# Patient Record
Sex: Female | Born: 1935 | Race: White | Hispanic: No | Smoking: Former smoker
Health system: Southern US, Community
[De-identification: ages and names within clinical notes are randomized; demographics above are authoritative.]

## PROBLEM LIST (undated history)

## (undated) DIAGNOSIS — I10 Essential (primary) hypertension: Secondary | ICD-10-CM

## (undated) DIAGNOSIS — G8929 Other chronic pain: Secondary | ICD-10-CM

## (undated) DIAGNOSIS — M1990 Unspecified osteoarthritis, unspecified site: Secondary | ICD-10-CM

## (undated) DIAGNOSIS — F32A Depression, unspecified: Secondary | ICD-10-CM

## (undated) DIAGNOSIS — E782 Mixed hyperlipidemia: Secondary | ICD-10-CM

## (undated) DIAGNOSIS — K219 Gastro-esophageal reflux disease without esophagitis: Secondary | ICD-10-CM

## (undated) DIAGNOSIS — I4891 Unspecified atrial fibrillation: Secondary | ICD-10-CM

## (undated) DIAGNOSIS — E079 Disorder of thyroid, unspecified: Secondary | ICD-10-CM

## (undated) DIAGNOSIS — E7800 Pure hypercholesterolemia, unspecified: Secondary | ICD-10-CM

## (undated) DIAGNOSIS — E039 Hypothyroidism, unspecified: Secondary | ICD-10-CM

## (undated) HISTORY — PX: BACK SURGERY: SHX140

## (undated) HISTORY — DX: Unspecified osteoarthritis, unspecified site: M19.90

## (undated) HISTORY — DX: Unspecified atrial fibrillation: I48.91

## (undated) HISTORY — PX: SPINE SURGERY: SHX786

## (undated) HISTORY — DX: Depression, unspecified: F32.A

## (undated) HISTORY — DX: Hypothyroidism, unspecified: E03.9

## (undated) HISTORY — PX: ABDOMINAL HYSTERECTOMY: SHX81

## (undated) HISTORY — DX: Mixed hyperlipidemia: E78.2

---

## 2014-01-24 ENCOUNTER — Emergency Department
Admission: EM | Admit: 2014-01-24 | Discharge: 2014-01-24 | Disposition: A | Discharge status: Home / Self Care | Payer: Medicare HMO | Source: Home / Self Care

## 2014-01-25 ENCOUNTER — Ambulatory Visit (INDEPENDENT_AMBULATORY_CARE_PROVIDER_SITE_OTHER): Payer: Medicare HMO

## 2014-01-25 ENCOUNTER — Ambulatory Visit (INDEPENDENT_AMBULATORY_CARE_PROVIDER_SITE_OTHER): Payer: Medicare HMO | Admitting: Sports Medicine

## 2014-01-25 ENCOUNTER — Encounter: Payer: Self-pay | Admitting: Sports Medicine

## 2014-01-25 VITALS — BP 191/83 | HR 70 | Ht 64.0 in | Wt 174.0 lb

## 2014-01-25 DIAGNOSIS — M169 Osteoarthritis of hip, unspecified: Secondary | ICD-10-CM

## 2014-01-25 DIAGNOSIS — M1710 Unilateral primary osteoarthritis, unspecified knee: Secondary | ICD-10-CM

## 2014-01-25 DIAGNOSIS — M25569 Pain in unspecified knee: Secondary | ICD-10-CM

## 2014-01-25 DIAGNOSIS — M1612 Unilateral primary osteoarthritis, left hip: Secondary | ICD-10-CM

## 2014-01-25 DIAGNOSIS — M25559 Pain in unspecified hip: Secondary | ICD-10-CM

## 2014-01-25 DIAGNOSIS — IMO0002 Reserved for concepts with insufficient information to code with codable children: Secondary | ICD-10-CM

## 2014-01-25 DIAGNOSIS — M1711 Unilateral primary osteoarthritis, right knee: Secondary | ICD-10-CM

## 2014-01-25 DIAGNOSIS — M1610 Unilateral primary osteoarthritis, unspecified hip: Secondary | ICD-10-CM

## 2014-01-25 DIAGNOSIS — M25469 Effusion, unspecified knee: Secondary | ICD-10-CM

## 2014-01-25 MED ORDER — CELECOXIB 200 MG PO CAPS: ORAL_CAPSULE | ORAL | Status: DC

## 2014-01-25 NOTE — Assessment & Plan Note (Signed)
Right knee injection as above. X-rays, Celebrex, physical therapy. Return in one month.

## 2014-01-25 NOTE — Progress Notes (Signed)
   Subjective:    I'm seeing this patient as a consultation for:  Dr. Alvester MorinNewton  CC: Left hip and right knee pain  HPI: Left hip: Pain is localized in the groin, moderate, persistent, present for years, worse with weightbearing and hip flexion.  Right knee pain: Localized joint line, moderate, persistent, worse with weightbearing, worse in the mornings.  Past medical history, Surgical history, Family history not pertinant except as noted below, Social history, Allergies, and medications have been entered into the medical record, reviewed, and no changes needed.   Review of Systems: No headache, visual changes, nausea, vomiting, diarrhea, constipation, dizziness, abdominal pain, skin rash, fevers, chills, night sweats, weight loss, swollen lymph nodes, body aches, joint swelling, muscle aches, chest pain, shortness of breath, mood changes, visual or auditory hallucinations.   Objective:   General: Well Developed, well nourished, and in no acute distress.  Neuro/Psych: Alert and oriented x3, extra-ocular muscles intact, able to move all 4 extremities, sensation grossly intact. Skin: Warm and dry, no rashes noted.  Respiratory: Not using accessory muscles, speaking in full sentences, trachea midline.  Cardiovascular: Pulses palpable, no extremity edema. Abdomen: Does not appear distended. Left Hip: Reproduction of pain in groin with hip internal rotation. Pelvic alignment unremarkable to inspection and palpation. Standing hip rotation and gait without trendelenburg sign / unsteadiness. Greater trochanter without tenderness to palpation. No tenderness over piriformis. No pain with FABER or FADIR. No SI joint tenderness and normal minimal SI movement. Right Knee: Tender to palpation with moderate swelling at the medial joint line. ROM full in flexion and extension and lower leg rotation. Ligaments with solid consistent endpoints including ACL, PCL, LCL, MCL. Negative Mcmurray's, Apley's,  and Thessalonian tests. Non painful patellar compression. Patellar glide without crepitus. Patellar and quadriceps tendons unremarkable. Hamstring and quadriceps strength is normal.   Procedure: Real-time Ultrasound Guided Injection of left femoral acetabular joint Device: GE Logiq E  Verbal informed consent obtained.  Time-out conducted.  Noted no overlying erythema, induration, or other signs of local infection.  Skin prepped in a sterile fashion.  Local anesthesia: Topical Ethyl chloride.  With sterile technique and under real time ultrasound guidance:  Spinal needle advanced into the joint, 2cc Kenalog 40, 4 cc lidocaine injected easily. Completed without difficulty  Pain immediately resolved suggesting accurate placement of the medication.  Advised to call if fevers/chills, erythema, induration, drainage, or persistent bleeding.  Images permanently stored and available for review in the ultrasound unit.  Impression: Technically successful ultrasound guided injection.  Procedure: Real-time Ultrasound Guided Injection of right knee Device: GE Logiq E  Verbal informed consent obtained.  Time-out conducted.  Noted no overlying erythema, induration, or other signs of local infection.  Skin prepped in a sterile fashion.  Local anesthesia: Topical Ethyl chloride.  With sterile technique and under real time ultrasound guidance:  2 cc Kenalog 40, 4 cc lidocaine injected easily into the suprapatellar recess. Completed without difficulty  Pain immediately resolved suggesting accurate placement of the medication.  Advised to call if fevers/chills, erythema, induration, drainage, or persistent bleeding.  Images permanently stored and available for review in the ultrasound unit.  Impression: Technically successful ultrasound guided injection.  Impression and Recommendations:   This case required medical decision making of moderate complexity.

## 2014-01-25 NOTE — Assessment & Plan Note (Signed)
Left femoral acetabular joint injection as above. Celebrex. X-rays. Formal physical therapy.

## 2014-01-30 ENCOUNTER — Telehealth: Payer: Self-pay

## 2014-01-30 MED ORDER — MELOXICAM 15 MG PO TABS: ORAL_TABLET | ORAL | Status: DC

## 2014-01-30 NOTE — Telephone Encounter (Signed)
Patient called stated that Celebrex was too high and she wanted to know if she could get something called in that was least expensive. Kanai Berrios,CMA

## 2014-01-30 NOTE — Telephone Encounter (Signed)
Switching to meloxicam. 

## 2014-01-30 NOTE — Telephone Encounter (Signed)
Spoke to patient and she stated that she has tried Meloxicam, and Naproxen and Tramadol  does nothing for her she stated that she cannot take because it make her sick at the stomach. She stated that she will contact her insurance to see what she can get but she really need to get it to a 2 tier in order for it to be cheaper. Sherley Mckenney,CMA

## 2014-01-31 MED ORDER — DICLOFENAC SODIUM 75 MG PO TBEC: 75.0000 mg | DELAYED_RELEASE_TABLET | Freq: Two times a day (BID) | ORAL | Status: DC

## 2014-01-31 NOTE — Telephone Encounter (Signed)
Lets try diclofenac.

## 2014-02-01 NOTE — Telephone Encounter (Signed)
Left a message on patient vm letting her know that Diclofenac was sent in to her pharmacy and if she had any questions or concern to call me back at the office. Masaye Gatchalian,CMA

## 2014-02-05 ENCOUNTER — Ambulatory Visit: Payer: Medicare HMO | Admitting: Physical Therapy

## 2014-02-12 ENCOUNTER — Ambulatory Visit (INDEPENDENT_AMBULATORY_CARE_PROVIDER_SITE_OTHER): Payer: Medicare HMO | Admitting: Physical Therapy

## 2014-02-12 DIAGNOSIS — M25559 Pain in unspecified hip: Secondary | ICD-10-CM

## 2014-02-12 DIAGNOSIS — M169 Osteoarthritis of hip, unspecified: Secondary | ICD-10-CM

## 2014-02-12 DIAGNOSIS — M6281 Muscle weakness (generalized): Secondary | ICD-10-CM

## 2014-02-12 DIAGNOSIS — M1610 Unilateral primary osteoarthritis, unspecified hip: Secondary | ICD-10-CM

## 2014-02-12 DIAGNOSIS — R269 Unspecified abnormalities of gait and mobility: Secondary | ICD-10-CM

## 2014-02-14 ENCOUNTER — Encounter (INDEPENDENT_AMBULATORY_CARE_PROVIDER_SITE_OTHER): Payer: Medicare HMO | Admitting: Physical Therapy

## 2014-02-14 DIAGNOSIS — M6281 Muscle weakness (generalized): Secondary | ICD-10-CM

## 2014-02-14 DIAGNOSIS — M25559 Pain in unspecified hip: Secondary | ICD-10-CM

## 2014-02-14 DIAGNOSIS — M1610 Unilateral primary osteoarthritis, unspecified hip: Secondary | ICD-10-CM

## 2014-02-14 DIAGNOSIS — R269 Unspecified abnormalities of gait and mobility: Secondary | ICD-10-CM

## 2014-02-14 DIAGNOSIS — M169 Osteoarthritis of hip, unspecified: Secondary | ICD-10-CM

## 2014-02-18 ENCOUNTER — Encounter (INDEPENDENT_AMBULATORY_CARE_PROVIDER_SITE_OTHER): Payer: Medicare HMO | Admitting: Physical Therapy

## 2014-02-18 DIAGNOSIS — M6281 Muscle weakness (generalized): Secondary | ICD-10-CM

## 2014-02-18 DIAGNOSIS — R269 Unspecified abnormalities of gait and mobility: Secondary | ICD-10-CM

## 2014-02-18 DIAGNOSIS — M1610 Unilateral primary osteoarthritis, unspecified hip: Secondary | ICD-10-CM

## 2014-02-18 DIAGNOSIS — M169 Osteoarthritis of hip, unspecified: Secondary | ICD-10-CM

## 2014-02-18 DIAGNOSIS — M25559 Pain in unspecified hip: Secondary | ICD-10-CM

## 2014-02-21 ENCOUNTER — Encounter (INDEPENDENT_AMBULATORY_CARE_PROVIDER_SITE_OTHER): Payer: Medicare HMO | Admitting: Physical Therapy

## 2014-02-21 DIAGNOSIS — R269 Unspecified abnormalities of gait and mobility: Secondary | ICD-10-CM

## 2014-02-21 DIAGNOSIS — M6281 Muscle weakness (generalized): Secondary | ICD-10-CM

## 2014-02-21 DIAGNOSIS — M169 Osteoarthritis of hip, unspecified: Secondary | ICD-10-CM

## 2014-02-21 DIAGNOSIS — M1610 Unilateral primary osteoarthritis, unspecified hip: Secondary | ICD-10-CM

## 2014-02-21 DIAGNOSIS — M25559 Pain in unspecified hip: Secondary | ICD-10-CM

## 2014-02-22 ENCOUNTER — Ambulatory Visit (INDEPENDENT_AMBULATORY_CARE_PROVIDER_SITE_OTHER): Payer: Medicare HMO | Admitting: Sports Medicine

## 2014-02-22 ENCOUNTER — Encounter: Payer: Self-pay | Admitting: Sports Medicine

## 2014-02-22 VITALS — BP 112/65 | HR 74 | Ht 63.0 in | Wt 171.0 lb

## 2014-02-22 DIAGNOSIS — M1610 Unilateral primary osteoarthritis, unspecified hip: Secondary | ICD-10-CM

## 2014-02-22 DIAGNOSIS — IMO0002 Reserved for concepts with insufficient information to code with codable children: Secondary | ICD-10-CM

## 2014-02-22 DIAGNOSIS — M169 Osteoarthritis of hip, unspecified: Secondary | ICD-10-CM

## 2014-02-22 DIAGNOSIS — M7062 Trochanteric bursitis, left hip: Secondary | ICD-10-CM | POA: Insufficient documentation

## 2014-02-22 DIAGNOSIS — M1612 Unilateral primary osteoarthritis, left hip: Secondary | ICD-10-CM

## 2014-02-22 DIAGNOSIS — M1711 Unilateral primary osteoarthritis, right knee: Secondary | ICD-10-CM

## 2014-02-22 DIAGNOSIS — M1710 Unilateral primary osteoarthritis, unspecified knee: Secondary | ICD-10-CM

## 2014-02-22 DIAGNOSIS — M76899 Other specified enthesopathies of unspecified lower limb, excluding foot: Secondary | ICD-10-CM

## 2014-02-22 NOTE — Assessment & Plan Note (Signed)
Resolved after injection. Return as needed.

## 2014-02-22 NOTE — Assessment & Plan Note (Signed)
This is new. She declines injection today, continue to work aggressively with physical therapy. Return to see me in 3 weeks, consider trochanteric bursa injection if no better.

## 2014-02-22 NOTE — Assessment & Plan Note (Signed)
Osteoarthritis pain in the groin has completely resolved after injection. She does have some trochanteric bursa symptoms.

## 2014-02-22 NOTE — Progress Notes (Signed)
  Subjective:    CC: Followup  HPI: Left hip osteoarthritis: Groin pain is now completely resolved after physical therapy and an intra-articular injection.  Left trochanteric bursitis: Now has a different pain that she localizes over the greater trochanter, improved slightly with physical therapy, not amenable to injection today.  Right knee osteoarthritis: Completely resolved after injection and physical therapy.  Past medical history, Surgical history, Family history not pertinant except as noted below, Social history, Allergies, and medications have been entered into the medical record, reviewed, and no changes needed.   Review of Systems: No fevers, chills, night sweats, weight loss, chest pain, or shortness of breath.   Objective:    General: Well Developed, well nourished, and in no acute distress.  Neuro: Alert and oriented x3, extra-ocular muscles intact, sensation grossly intact.  HEENT: Normocephalic, atraumatic, pupils equal round reactive to light, neck supple, no masses, no lymphadenopathy, thyroid nonpalpable.  Skin: Warm and dry, no rashes. Cardiac: Regular rate and rhythm, no murmurs rubs or gallops, no lower extremity edema.  Respiratory: Clear to auscultation bilaterally. Not using accessory muscles, speaking in full sentences. Left Hip: ROM IR: 45 Deg, ER: 45 Deg, Flexion: 120 Deg, Extension: 100 Deg, Abduction: 45 Deg, Adduction: 45 Deg Strength IR: 5/5, ER: 5/5, Flexion: 5/5, Extension: 5/5, Abduction: 5/5, Adduction: 5/5 Pelvic alignment unremarkable to inspection and palpation. Standing hip rotation and gait without trendelenburg sign / unsteadiness. Greater trochanter with tenderness to palpation. No tenderness over piriformis. No pain with FABER or FADIR. No SI joint tenderness and normal minimal SI movement.  Impression and Recommendations:

## 2014-02-26 ENCOUNTER — Encounter: Payer: Medicare HMO | Admitting: Physical Therapy

## 2014-02-28 ENCOUNTER — Encounter (INDEPENDENT_AMBULATORY_CARE_PROVIDER_SITE_OTHER): Payer: Medicare HMO | Admitting: Physical Therapy

## 2014-02-28 DIAGNOSIS — M6281 Muscle weakness (generalized): Secondary | ICD-10-CM

## 2014-02-28 DIAGNOSIS — R269 Unspecified abnormalities of gait and mobility: Secondary | ICD-10-CM

## 2014-02-28 DIAGNOSIS — M25559 Pain in unspecified hip: Secondary | ICD-10-CM

## 2014-02-28 DIAGNOSIS — M1610 Unilateral primary osteoarthritis, unspecified hip: Secondary | ICD-10-CM

## 2014-02-28 DIAGNOSIS — M169 Osteoarthritis of hip, unspecified: Secondary | ICD-10-CM

## 2014-03-06 ENCOUNTER — Encounter (INDEPENDENT_AMBULATORY_CARE_PROVIDER_SITE_OTHER): Payer: Medicare HMO | Admitting: Physical Therapy

## 2014-03-06 DIAGNOSIS — M169 Osteoarthritis of hip, unspecified: Secondary | ICD-10-CM

## 2014-03-06 DIAGNOSIS — M25559 Pain in unspecified hip: Secondary | ICD-10-CM

## 2014-03-06 DIAGNOSIS — M1610 Unilateral primary osteoarthritis, unspecified hip: Secondary | ICD-10-CM

## 2014-03-06 DIAGNOSIS — M6281 Muscle weakness (generalized): Secondary | ICD-10-CM

## 2014-03-06 DIAGNOSIS — R269 Unspecified abnormalities of gait and mobility: Secondary | ICD-10-CM

## 2014-03-07 ENCOUNTER — Encounter: Payer: Medicare HMO | Admitting: Physical Therapy

## 2014-03-08 ENCOUNTER — Encounter: Payer: Medicare HMO | Admitting: Physical Therapy

## 2014-03-11 ENCOUNTER — Encounter: Payer: Medicare HMO | Admitting: Physical Therapy

## 2014-03-18 ENCOUNTER — Ambulatory Visit: Payer: Medicare HMO | Admitting: Sports Medicine

## 2014-03-18 ENCOUNTER — Encounter: Payer: Medicare HMO | Admitting: Family Medicine

## 2014-03-19 NOTE — Progress Notes (Signed)
   Subjective:    Patient ID: Robin Malone, female    DOB: Jul 18, 1934, 80 y.o.   MRN: [id number]  HPI    Review of Systems     Objective:   Physical Exam        Assessment & Plan:  Chart was entered in error. A point was canceled.

## 2014-04-11 ENCOUNTER — Telehealth: Payer: Self-pay

## 2014-04-11 NOTE — Telephone Encounter (Signed)
Patient request a Rx for Gabapentin 600mg  sent to [facility]. I did not see where we prescribed that medication and according to patient last visit she was told to follow up in 3 weeks which she did not do so patient has been notified to schedule a follow up appt. Khayree Delellis,CMA

## 2014-04-11 NOTE — Telephone Encounter (Signed)
Agree, I don't see where we have ever prescribed this.

## 2015-02-17 ENCOUNTER — Ambulatory Visit (INDEPENDENT_AMBULATORY_CARE_PROVIDER_SITE_OTHER): Payer: Medicare HMO | Admitting: Sports Medicine

## 2015-02-17 ENCOUNTER — Ambulatory Visit (INDEPENDENT_AMBULATORY_CARE_PROVIDER_SITE_OTHER): Payer: Medicare HMO

## 2015-02-17 ENCOUNTER — Encounter: Payer: Self-pay | Admitting: Sports Medicine

## 2015-02-17 VITALS — BP 136/64 | HR 80 | Ht 64.0 in | Wt 179.0 lb

## 2015-02-17 DIAGNOSIS — M4316 Spondylolisthesis, lumbar region: Secondary | ICD-10-CM | POA: Insufficient documentation

## 2015-02-17 DIAGNOSIS — M48061 Spinal stenosis, lumbar region without neurogenic claudication: Secondary | ICD-10-CM | POA: Insufficient documentation

## 2015-02-17 DIAGNOSIS — M47896 Other spondylosis, lumbar region: Secondary | ICD-10-CM

## 2015-02-17 DIAGNOSIS — M5416 Radiculopathy, lumbar region: Secondary | ICD-10-CM

## 2015-02-17 MED ORDER — PREDNISONE 50 MG PO TABS: ORAL_TABLET | ORAL | Status: DC

## 2015-02-17 NOTE — Progress Notes (Signed)
  Subjective:    CC: back pain  HPI: Patient presents with complaint of intermittent aching lower back pain for 8 months. The pain radiates to her left anterior thigh and shin. The pain is worsened with activity and prolonged sitting. The patient plays golf and twisting makes her pain worse. Recently the pain has been waking the patient up at night. She has a history of lumbar fusion but does not remember which levels were fused. She has had formal PT for her hip pain in the past and no longer complains of pain in her hip or over the trochanteric bursa. She has not undergone formal PT for this new back pain. She denies any numbness, tingling, lower extremity weakness, saddle anesthesia, urine or stool incontinence.   Past medical history, Surgical history, Family history not pertinant except as noted below, Social history, Allergies, and medications have been entered into the medical record, reviewed, and no changes needed.   Review of Systems: No fevers, chills, night sweats, weight loss, chest pain, or shortness of breath.   Objective:    General: Well Developed, well nourished, and in no acute distress.  Neuro: Alert and oriented x3, extra-ocular muscles intact, sensation grossly intact.  HEENT: Normocephalic, atraumatic, pupils equal round reactive to light, neck supple, no masses, no lymphadenopathy, thyroid nonpalpable.  Skin: Warm and dry, no rashes. Cardiac: Regular rate and rhythm, no murmurs rubs or gallops, no lower extremity edema.  Respiratory: Clear to auscultation bilaterally. Not using accessory muscles, speaking in full sentences. MSK: Back Exam:  Inspection: surgical scar present over lower back. SLR laying: Negative  XSLR laying: Negative   Palpable tenderness: over SI joints bilaterally, worse over left than right. FABER: negative. Sensory change: Gross sensation intact to all lumbar and sacral dermatomes.  Reflexes: 2+ at both patellar tendons, 1+ at achilles tendons,  Babinski's downgoing.  Strength at foot  Plantar-flexion: 5/5 Dorsi-flexion: 5/5 Eversion: 5/5 Inversion: 5/5  Leg strength  Quad: 5/5 Hamstring: 4/5 Hip flexor: 5/5 Hip abductors: 5/5  Gait unremarkable.   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

## 2015-02-17 NOTE — Assessment & Plan Note (Signed)
Post-lumbar fusion, patient is unaware of which level was fused. She likely has a reherniation versus adjacent level disease with left-sided L5 distribution radiculitis. We are going to proceed quickly through physical therapy, prednisone, I would like an x-ray and an MRI with and without IV contrast considering prior fusion. Return to see me to go over MRI results.

## 2015-02-18 ENCOUNTER — Telehealth: Payer: Self-pay | Admitting: Sports Medicine

## 2015-02-18 LAB — BASIC METABOLIC PANEL WITH GFR
CO2: 28 meq/L (ref 19–32)
Creat: 0.97 mg/dL (ref 0.50–1.10)
Glucose, Bld: 85 mg/dL (ref 70–99)

## 2015-02-18 LAB — BASIC METABOLIC PANEL
BUN: 20 mg/dL (ref 6–23)
Calcium: 9.4 mg/dL (ref 8.4–10.5)
Chloride: 104 mEq/L (ref 96–112)
Potassium: 3.9 mEq/L (ref 3.5–5.3)
Sodium: 141 mEq/L (ref 135–145)

## 2015-02-18 NOTE — Telephone Encounter (Signed)
Called [employer] for prior auth on MRI lumbar spine. Auth # [id number] Exp 03/21/2015 - CF

## 2015-02-22 ENCOUNTER — Ambulatory Visit (HOSPITAL_BASED_OUTPATIENT_CLINIC_OR_DEPARTMENT_OTHER)
Admission: RE | Admit: 2015-02-22 | Discharge: 2015-02-22 | Disposition: A | Discharge status: Home / Self Care | Payer: Medicare HMO | Source: Ambulatory Visit | Attending: Sports Medicine | Admitting: Sports Medicine

## 2015-02-22 DIAGNOSIS — M5416 Radiculopathy, lumbar region: Secondary | ICD-10-CM | POA: Insufficient documentation

## 2015-02-22 MED ORDER — GADOBENATE DIMEGLUMINE 529 MG/ML IV SOLN: 16.0000 mL | Freq: Once | INTRAVENOUS | Status: AC | PRN

## 2015-02-27 ENCOUNTER — Encounter: Payer: Self-pay | Admitting: Sports Medicine

## 2015-02-27 ENCOUNTER — Ambulatory Visit (INDEPENDENT_AMBULATORY_CARE_PROVIDER_SITE_OTHER): Payer: Medicare HMO | Admitting: Sports Medicine

## 2015-02-27 VITALS — BP 126/65 | HR 72 | Wt 176.0 lb

## 2015-02-27 DIAGNOSIS — M4316 Spondylolisthesis, lumbar region: Secondary | ICD-10-CM | POA: Diagnosis not present

## 2015-02-27 DIAGNOSIS — M1711 Unilateral primary osteoarthritis, right knee: Secondary | ICD-10-CM

## 2015-02-27 NOTE — Progress Notes (Signed)
  Subjective:    CC: MRI results  HPI: Robin Malone returns, she has low back pain, better with flexion, with radiation cramping into both legs when walking. She has a history of an L4-L5 posterior element fusion for what sounds to be spondylolisthesis in the distant past. She has not done any physical therapy as asked, and apologizes. Obvious lesions continues to have pain.  Right knee pain: History of osteoarthritis, previous injection was approximately 13-14 months ago. Pain is moderate, persistent, localized at the medial joint line without mechanical symptoms.  Past medical history, Surgical history, Family history not pertinant except as noted below, Social history, Allergies, and medications have been entered into the medical record, reviewed, and no changes needed.   Review of Systems: No fevers, chills, night sweats, weight loss, chest pain, or shortness of breath.   Objective:    General: Well Developed, well nourished, and in no acute distress.  Neuro: Alert and oriented x3, extra-ocular muscles intact, sensation grossly intact.  HEENT: Normocephalic, atraumatic, pupils equal round reactive to light, neck supple, no masses, no lymphadenopathy, thyroid nonpalpable.  Skin: Warm and dry, no rashes. Cardiac: Regular rate and rhythm, no murmurs rubs or gallops, no lower extremity edema.  Respiratory: Clear to auscultation bilaterally. Not using accessory muscles, speaking in full sentences.  Procedure: Real-time Ultrasound Guided Injection of right knee Device: GE Logiq E  Verbal informed consent obtained.  Time-out conducted.  Noted no overlying erythema, induration, or other signs of local infection.  Skin prepped in a sterile fashion.  Local anesthesia: Topical Ethyl chloride.  With sterile technique and under real time ultrasound guidance:  2 mL kenalog 40, 4 mL lidocaine injected easily. Completed without difficulty  Pain immediately resolved suggesting accurate placement of the  medication.  Advised to call if fevers/chills, erythema, induration, drainage, or persistent bleeding.  Images permanently stored and available for review in the ultrasound unit.   Impression: Technically successful ultrasound guided injection. MRI personally reviewed and shows multilevel lumbar spondylosis with moderate L4 on L5 spondylolisthesis with evidence of an attempted posterior fusion, and moderate to severe central canal stenosis  Impression and Recommendations:

## 2015-02-27 NOTE — Assessment & Plan Note (Signed)
Moderate L4 on L5 spondylolisthesis with moderate severe central canal stenosis, and with what appears to be evidence of a failed L4-L5 posterior element fusion. We still need to do physical therapy. Considering her pain I'm also going to set her up with a bilateral L4-L5 transforaminal epidural, I would also like her to see Dr. Yevette Edwardsumonski with spine surgery for consultation. Return to see me 4 weeks after the injection to evaluate response.

## 2015-02-27 NOTE — Assessment & Plan Note (Signed)
Good response to previous injection. Repeat aspiration and injection today.

## 2015-03-07 ENCOUNTER — Ambulatory Visit
Admission: RE | Admit: 2015-03-07 | Discharge: 2015-03-07 | Disposition: A | Discharge status: Home / Self Care | Payer: Medicare HMO | Source: Ambulatory Visit | Attending: Sports Medicine | Admitting: Sports Medicine

## 2015-03-07 ENCOUNTER — Other Ambulatory Visit: Payer: Self-pay | Admitting: Sports Medicine

## 2015-03-07 DIAGNOSIS — M4316 Spondylolisthesis, lumbar region: Secondary | ICD-10-CM

## 2015-03-07 MED ORDER — IOHEXOL 180 MG/ML  SOLN
1.0000 mL | Freq: Once | INTRAMUSCULAR | Status: AC | PRN
  Administered 2015-03-07: 1 mL via EPIDURAL

## 2015-03-07 MED ORDER — METHYLPREDNISOLONE ACETATE 40 MG/ML INJ SUSP (RADIOLOG
120.0000 mg | Freq: Once | INTRAMUSCULAR | Status: AC
  Administered 2015-03-07: 120 mg via EPIDURAL

## 2015-03-07 NOTE — Discharge Instructions (Signed)

## 2015-03-12 ENCOUNTER — Ambulatory Visit (INDEPENDENT_AMBULATORY_CARE_PROVIDER_SITE_OTHER): Payer: Medicare HMO | Admitting: Physical Therapy

## 2015-03-12 ENCOUNTER — Encounter: Payer: Self-pay | Admitting: Physical Therapy

## 2015-03-12 DIAGNOSIS — M545 Low back pain, unspecified: Secondary | ICD-10-CM

## 2015-03-12 DIAGNOSIS — M6281 Muscle weakness (generalized): Secondary | ICD-10-CM

## 2015-03-12 NOTE — Therapy (Signed)
[facility] [address] [place], [place], [address] Phone: [phone]   Fax:  [phone]  Physical Therapy Evaluation  Patient Details  Name: Robin Malone MRN: [id number] Date of Birth: Dec 31, 1933 Referring Provider:  Monica Becton,*  Encounter Date: 03/12/2015      PT End of Session - 03/12/15 1433    Visit Number 1   Number of Visits 8   Date for PT Re-Evaluation 04/09/15   PT Start Time 1433   PT Stop Time 1522   PT Time Calculation (min) 49 min   Activity Tolerance Patient limited by pain      History reviewed. No pertinent past medical history.  History reviewed. No pertinent past surgical history.  There were no vitals filed for this visit.  Visit Diagnosis:  Pain of lumbar spine - Plan: PT plan of care cert/re-cert  Muscle weakness-general - Plan: PT plan of care cert/re-cert      Subjective Assessment - 03/12/15 1434    Symptoms Had pain in her back and groin Lt side.  Had an injection 5 days ago and it seemed to help some, she no longer has the pain in her legs at night now   Pertinent History She has not been going to the gym since last year when she began having Lt hip pain.    How long can you stand comfortably? tolerates about 20'    Diagnostic tests MRI showed a lot of degeneration and narrowing of the spine   Patient Stated Goals pt wishes to strengthen her legs and her back and bring her Lt foot up to donn her panty hose.    Currently in Pain? Yes   Pain Score 3    Pain Location Back   Pain Orientation Other (Comment)  below the ribs   Pain Descriptors / Indicators Dull            [facility] PT Assessment - 03/12/15 0001    Assessment   Medical Diagnosis Lt lumbar radiculitis   Onset Date 03/11/14   Next MD Visit 03/27/15   Precautions   Precautions None   Balance Screen   Has the patient fallen in the past 6 months No   Has the patient had a decrease in activity level  because of a fear of falling?  No   Is the patient reluctant to leave their home because of a fear of falling?  No   Home Environment   Living Enviornment Private residence   Home Access --  has stairs to get to the laundry   Additional Comments has trouble on the stairs when she is carrying something   Prior Function   Level of Independence --  I with all activies   Leisure wishes to return to th gym and walk her dog   Observation/Other Assessments   Focus on Therapeutic Outcomes (FOTO)  57% limited   Posture/Postural Control   Posture/Postural Control Postural limitations   Postural Limitations Flexed trunk;Left pelvic obliquity;Decreased lumbar lordosis   ROM / Strength   AROM / PROM / Strength AROM;Strength   AROM   AROM Assessment Site Lumbar;Hip   Right/Left Hip Right;Left   Left Hip External Rotation  --  tightness in hip rotators and ITB Lt   Lumbar Flexion WNL  tight in back   Lumbar Extension WNL   Lumbar - Right Rotation WNL   Lumbar - Left Rotation WNL   Strength   Overall Strength --  bilat ankles  and knees WNL, except Lt knee extension 5-/5   Overall Strength Comments --  transverse abdominis poor, multifidis Lt fair, Rt poor   Strength Assessment Site Hip   Right/Left Hip Right;Left   Right Hip Flexion 5/5   Right Hip Extension 4+/5   Right Hip ABduction 4-/5   Left Hip Flexion 4+/5   Left Hip Extension 4/5   Left Hip ABduction 4-/5   Palpation   Palpation tightness in Lt lumbar paraspinals with tenderness and into Lt piriformis.  hypersensitive with CPA mobs scacrum to upper thoracic spine.                    [facility] Adult PT Treatment/Exercise - 03/12/15 0001    Exercises   Exercises Lumbar   Lumbar Exercises: Stretches   Lower Trunk Rotation --  10 reps   Pelvic Tilt --  5sec x 10 reps   Piriformis Stretch 2 reps;20 seconds   Piriformis Stretch Limitations unable to lift other leg   Lumbar Exercises: Supine   Other Supine Lumbar  Exercises thoracic lift x 10   Lumbar Exercises: Sidelying   Hip Abduction 20 reps  bilat   Modalities   Modalities Electrical Stimulation;Moist Heat   Moist Heat Therapy   Number Minutes Moist Heat 15 Minutes   Moist Heat Location --  lumbar   Electrical Stimulation   Electrical Stimulation Location lumbar and Lt piriformis   Electrical Stimulation Action IFC   Electrical Stimulation Parameters to tolerance   Electrical Stimulation Goals Pain;Tone                PT Education - 03/12/15 1507    Education provided Yes   Education Details HEP   Person(s) Educated Patient   Methods Explanation;Handout   Comprehension Verbalized understanding             PT Long Term Goals - 03/12/15 1511    PT LONG TERM GOAL #1   Title I with advanced HEP   Time 4   Period Weeks   Status New   PT LONG TERM GOAL #2   Title improve FOTO =/> CJ level   Time 4   Period Weeks   Status New   PT LONG TERM GOAL #3   Title sit with Lt leg crossed/ foot on Rt knee to donn panty hose   Time 4   Period Weeks   Status New   PT LONG TERM GOAL #4   Title increase strength bilat hip abduction and extension =/> 5-/5   Time 4   Period Weeks   Status New   PT LONG TERM GOAL #5   Title play golf without significant pain the next day in her back   Time 4   Period Weeks   Status New               Plan - 03/12/15 1512    Clinical Impression Statement Patient presents with long h/p LBP and Lt hip/LE pain,  Seems to have responded to injections last week with relief from pain into her LE.  She has weakness of her core and hips along with pain in her low to mid back now.    Pt will benefit from skilled therapeutic intervention in order to improve on the following deficits Decreased strength;Pain;Postural dysfunction;Improper body mechanics   Rehab Potential Good   PT Frequency 2x / week   PT Duration 4 weeks   PT Treatment/Interventions Electrical Stimulation;Cryotherapy;Manual  techniques;Ultrasound;Therapeutic exercise;Patient/family education;Therapeutic  activities;Moist Heat   PT Next Visit Plan progress core exercise and modalities to decrease pain   Consulted and Agree with Plan of Care Patient         Problem List Patient Active Problem List   Diagnosis Date Noted  . Spondylolisthesis at L4-L5 level 02/17/2015  . Trochanteric bursitis of left hip 02/22/2014  . Osteoarthritis of left hip 01/25/2014  . Osteoarthritis of right knee 01/25/2014    Roderic Scarce, PT 03/12/2015, 3:17 PM  [facility] [address] [place], [place], [address] Phone: [phone]   Fax:  [phone]

## 2015-03-12 NOTE — Patient Instructions (Signed)
Lower Trunk Rotation Stretch   Keeping back flat and feet together, rotate knees to left side. Hold _1___ seconds. Repeat _10___ times per set. Do __1__ sets per session. Do __1__ sessions per day.  [email or web address]://[email or web address].[email or web address] Piriformis (Supine)   Keep Right foot on the floor/bed, cross legs, left on top. Gently push the left knee away from the body until stretch is felt in buttock/hip of top leg. Hold __20__ seconds. Repeat ____ times per set. Do __1__ sets per session. Do __1__ sessions per day.  [email or web address]://[email or web address].us/676   Pelvic Tilt   Flatten back by tightening stomach muscles and buttocks. Repeat _10__ times per set. Do __2__ sets per session. Do ____ sessions per day.  [email or web address]://[email or web address].us/134   Side-Lying Leg Lift   Lie on side, both legs straight and in alignment with body. Lift top leg toward ceiling, leading with side of foot. Do NOT rotate leg outward or bring leg forward. Hold _1__ seconds. Relax and lower leg. Repeat _20 times, each leg. * Variation: Bend underneath knee for stability. Once a day    Thoracic Lift   Press shoulders down. Then lift mid-thoracic spine (area between the shoulder blades). Lift the breastbone slightly. Hold _5__ seconds. Relax. Repeat _10__ times. Once a day  [place] [phone]

## 2015-03-17 ENCOUNTER — Ambulatory Visit (INDEPENDENT_AMBULATORY_CARE_PROVIDER_SITE_OTHER): Payer: Medicare HMO | Admitting: Physical Therapy

## 2015-03-17 DIAGNOSIS — M545 Low back pain, unspecified: Secondary | ICD-10-CM

## 2015-03-17 DIAGNOSIS — M6281 Muscle weakness (generalized): Secondary | ICD-10-CM | POA: Diagnosis not present

## 2015-03-17 NOTE — Therapy (Signed)
[facility] [address] [place], [place], [address] Phone: [phone]   Fax:  [phone]  Physical Therapy Treatment  Patient Details  Name: Robin LevinBarbara Malone MRN: [id number] Date of Birth: January 05, 1934 Referring Provider:  Monica Bectonhekkekandam, Thomas J,*  Encounter Date: 03/17/2015      PT End of Session - 03/17/15 1251    Visit Number 2   Number of Visits 8   Date for PT Re-Evaluation 04/09/15   PT Start Time 1150   PT Stop Time 1257   PT Time Calculation (min) 67 min   Activity Tolerance Patient tolerated treatment well      No past medical history on file.  No past surgical history on file.  There were no vitals filed for this visit.  Visit Diagnosis:  Pain of lumbar spine  Muscle weakness-general      Subjective Assessment - 03/17/15 1153    Subjective Pt reports increased pain across the back and Lt buttock. Pt feels maybe the exercises are exacerbating condition.    Pain Score 7    Pain Location Back   Pain Descriptors / Indicators Constant  Hot and warm    Multiple Pain Sites Yes   Pain Location Buttocks   Pain Orientation Left   Pain Descriptors / Indicators Burning   Aggravating Factors  standing (prolonged for work in kitchen)   Pain Relieving Factors rest, medicine, bending over            [facility] - 03/17/15 0001    Assessment   Medical Diagnosis Lt lumbar radiculitis   Onset Date 03/11/14   Next MD Visit 03/27/15            [facility] Treatment/Exercise - 03/17/15 0001    Exercises   Exercises Lumbar;Knee/Hip   Lumbar Exercises: Stretches   Double Knee to Chest Stretch 2 reps;20 seconds   Lower Trunk Rotation 5 reps;10 seconds   Piriformis Stretch 2 reps;30 seconds   Lumbar Exercises: Aerobic   Stationary Bike NuStep level 4  x 5 min   Lumbar Exercises: Supine   Ab Set 5 reps;5 seconds   Bridge 5 seconds  1 set of 10, then 10 with 5 sec hold   Other Supine Lumbar  Exercises Abd set with hip abd x 10 each side;  thoracic lifts with 5 sec hold x 10;    Other Supine Lumbar Exercises Cross body stretch with green strap x 4 reps each side for each leg   Lumbar Exercises: Sidelying   Clam 20 reps  bilat   Hip Abduction 10 reps  Lt then Rt.    Hip Abduction Limitations Needs pillow under Lt hip due to pain in Lt sidelying    Modalities   Modalities Cryotherapy;Electrical Stimulation   Cryotherapy   Number Minutes Cryotherapy 15 Minutes   Cryotherapy Location Hip  Lt   Type of Cryotherapy Ice pack   Electrical Stimulation   Electrical Stimulation Location lumbar and Lt piriformis   Electrical Stimulation Action IFC   Electrical Stimulation Parameters to tolerance; 15 min    Electrical Stimulation Goals Pain            PT Long Term Goals - 03/12/15 1511    PT LONG TERM GOAL #1   Title I with advanced HEP   Time 4   Period Weeks   Status New   PT LONG TERM GOAL #2   Title improve FOTO =/> CJ level   Time 4  Period Weeks   Status New   PT LONG TERM GOAL #3   Title sit with Lt leg crossed/ foot on Rt knee to donn panty hose   Time 4   Period Weeks   Status New   PT LONG TERM GOAL #4   Title increase strength bilat hip abduction and extension =/> 5-/5   Time 4   Period Weeks   Status New   PT LONG TERM GOAL #5   Title play golf without significant pain the next day in her back   Time 4   Period Weeks   Status New         Plan - 03/17/15 1305    Clinical Impression Statement Pt initially arrived with 7/10 pain in LB and Lt buttock.  With therapeutic exercise, pain dropped 4 pts and with modalities pain dropped pain to 1/10. Pt tolerated all exercises without increase in symptoms; required VC for proper form. Making progress towards goals.    Pt will benefit from skilled therapeutic intervention in order to improve on the following deficits Decreased strength;Pain;Postural dysfunction;Improper body mechanics   Rehab Potential Good    PT Frequency 2x / week   PT Duration 4 weeks   PT Treatment/Interventions Electrical Stimulation;Cryotherapy;Manual techniques;Ultrasound;Therapeutic exercise;Patient/family education;Therapeutic activities;Moist Heat   PT Next Visit Plan progress core and hip strengthening exercises. modalities PRN to decrease pain   Consulted and Agree with Plan of Care Patient        Problem List Patient Active Problem List   Diagnosis Date Noted  . Spondylolisthesis at L4-L5 level 02/17/2015  . Trochanteric bursitis of left hip 02/22/2014  . Osteoarthritis of left hip 01/25/2014  . Osteoarthritis of right knee 01/25/2014   Mayer Camel, PTA 03/17/2015 1:09 PM   [facility] [place] [address] [place], [place], [address] Phone: [phone]   Fax:  [phone]

## 2015-03-19 ENCOUNTER — Encounter: Payer: Medicare HMO | Admitting: Physical Therapy

## 2015-03-20 ENCOUNTER — Ambulatory Visit (INDEPENDENT_AMBULATORY_CARE_PROVIDER_SITE_OTHER): Payer: Medicare HMO | Admitting: Sports Medicine

## 2015-03-20 ENCOUNTER — Encounter: Payer: Self-pay | Admitting: Sports Medicine

## 2015-03-20 ENCOUNTER — Ambulatory Visit (INDEPENDENT_AMBULATORY_CARE_PROVIDER_SITE_OTHER): Payer: Medicare HMO

## 2015-03-20 DIAGNOSIS — M4316 Spondylolisthesis, lumbar region: Secondary | ICD-10-CM

## 2015-03-20 MED ORDER — PREDNISONE (PAK) 10 MG PO TABS: ORAL_TABLET | ORAL | Status: DC

## 2015-03-20 NOTE — Progress Notes (Deleted)
  Procedure:  Excision of   Risks, benefits, and alternatives explained and consent obtained. Time out conducted. Surface prepped with alcohol. 5cc lidocaine with epinephine infiltrated in a field block. Adequate anesthesia ensured. Area prepped and draped in a sterile fashion. Excision performed with: Hemostasis achieved. Pt stable.
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
## 2015-03-21 ENCOUNTER — Telehealth: Payer: Self-pay | Admitting: Sports Medicine

## 2015-03-21 NOTE — Telephone Encounter (Signed)
Advised Pt of imaging results. Pt verbalized understanding, no further questions. Will discuss information further at upcoming appt.

## 2015-03-24 ENCOUNTER — Ambulatory Visit (INDEPENDENT_AMBULATORY_CARE_PROVIDER_SITE_OTHER): Payer: Medicare HMO | Admitting: Physical Therapy

## 2015-03-24 ENCOUNTER — Ambulatory Visit: Payer: Medicare HMO | Admitting: Sports Medicine

## 2015-03-24 DIAGNOSIS — M545 Low back pain, unspecified: Secondary | ICD-10-CM

## 2015-03-24 DIAGNOSIS — M6281 Muscle weakness (generalized): Secondary | ICD-10-CM
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
## 2015-03-24 NOTE — Patient Instructions (Signed)
Lower Trunk Rotation Stretch   Keeping back flat and feet together, rotate knees to left side. Hold _1___ seconds. Repeat _10___ times per set. Do __1__ sets per session. Do __1__ sessions per day.  http://orth.exer.us/122 Piriformis (Supine)   Keep Right foot on the floor/bed, cross legs, left on top. Gently push the left knee away from the body until stretch is felt in buttock/hip of top leg. Hold __20__ seconds. Repeat ____ times per set. Do __1__ sets per session. Do __1__ sessions per day.  USE TOWEL to assist knee to chest  http://orth.exer.us/676  Pelvic Tilt   Flatten back by tightening stomach muscles and buttocks. Repeat _10__ times per set. Do __2__ sets per session. Do ____ sessions per day.  http://orth.exer.us/134   Side-Lying Leg Lift Clam Shell 45 Degrees   Lying with hips and knees bent 45, one pillow between knees and ankles. Lift knee. Be sure pelvis does not roll backward. Do not arch back. Do _20__ times, each leg, _1__ times per day.  http://ss.exer.us/75   Copyright  VHI. All rights reserved.    Thoracic Lift   Press shoulders down. Then lift mid-thoracic spine (area between the shoulder blades). Lift the breastbone slightly. Hold _5__ seconds. Relax. Repeat _10__ times. Once a day  [place] [phone]

## 2015-03-26 ENCOUNTER — Ambulatory Visit (INDEPENDENT_AMBULATORY_CARE_PROVIDER_SITE_OTHER): Payer: Medicare HMO | Admitting: Physical Therapy

## 2015-03-26 DIAGNOSIS — M6281 Muscle weakness (generalized): Secondary | ICD-10-CM | POA: Diagnosis not present

## 2015-03-26 DIAGNOSIS — M545 Low back pain, unspecified: Secondary | ICD-10-CM

## 2015-03-26 NOTE — Therapy (Signed)
[facility] [address] [place], [place], [address] Phone: [phone]   Fax:  [phone]  Physical Therapy Treatment  Patient Details  Name: Robin LevinBarbara Malone MRN: [id number] Date of Birth: [birth year] Referring Provider:  Monica Bectonhekkekandam, Thomas J,*  Encounter Date: 03/26/2015      PT End of Session - 03/26/15 1432    Visit Number 4   Number of Visits 8   Date for PT Re-Evaluation 04/09/15   PT Start Time 1437   PT Stop Time 1531   PT Time Calculation (min) 54 min      No past medical history on file.  No past surgical history on file.  There were no vitals filed for this visit.  Visit Diagnosis:  Pain of lumbar spine  Muscle weakness-general      Subjective Assessment - 03/26/15 1438    Subjective Pt reports she feels the prednisone is kicking iin. Things feel a little bettder.   Currently in Pain? Yes   Pain Score 2    Pain Location Thoracic   Pain Orientation Left   Pain Descriptors / Indicators Constant   Multiple Pain Sites No                       [facility] Adult PT Treatment/Exercise - 03/26/15 0001    Exercises   Exercises Knee/Hip   Lumbar Exercises: Stretches   Single Knee to Chest Stretch 2 reps;30 seconds   Double Knee to Chest Stretch 2 reps;30 seconds   Piriformis Stretch 1 rep;20 seconds  supine and sittiing   Lumbar Exercises: Supine   Bridge 10 reps;5 seconds  bridge with knees in/out 2x5; with marching x 10ea   Other Supine Lumbar Exercises thoracic lifts wiht shoulder press 5 sec hold x 10; TrA set 5 sec hold, 2 x 5 with VCs to engage pelvic floor   Knee/Hip Exercises: Aerobic   Stationary Bike Nustep L5 x 5 min   Knee/Hip Exercises: Standing   Lateral Step Up 20 reps  6 inch and 8 inch   Forward Step Up 20 reps  6 inches   Other Standing Knee Exercises hip abd at counter x 20 ea   Modalities   Modalities Cryotherapy;Electrical Stimulation   Cryotherapy   Number  Minutes Cryotherapy 15 Minutes   Cryotherapy Location Hip   Type of Cryotherapy Ice pack   Electrical Stimulation   Electrical Stimulation Location lt piriformis   Electrical Stimulation Action premod   Electrical Stimulation Parameters to tolerance   Electrical Stimulation Goals Pain                PT Education - 03/26/15 1614    Education provided Yes   Education Details hep   Person(s) Educated Patient   Methods Explanation;Demonstration;Handout   Comprehension Verbalized understanding;Returned demonstration             PT Long Term Goals - 03/12/15 1511    PT LONG TERM GOAL #1   Title I with advanced HEP   Time 4   Period Weeks   Status New   PT LONG TERM GOAL #2   Title improve FOTO =/> CJ level   Time 4   Period Weeks   Status New   PT LONG TERM GOAL #3   Title sit with Lt leg crossed/ foot on Rt knee to donn panty hose   Time 4   Period Weeks   Status New   PT LONG TERM GOAL #  4   Title increase strength bilat hip abduction and extension =/> 5-/5   Time 4   Period Weeks   Status New   PT LONG TERM GOAL #5   Title play golf without significant pain the next day in her back   Time 4   Period Weeks   Status New               Plan - 03/26/15 1618    Clinical Impression Statement Significant decrease in back pain today and no buttock pain until after NuStep. Patient reports compliance with HEP.   PT Next Visit Plan continue to monitor correct TrA contraction with TE, steps and other hip strengthening.   Consulted and Agree with Plan of Care Patient        Problem List Patient Active Problem List   Diagnosis Date Noted  . Spondylolisthesis at L4-L5 level 02/17/2015  . Trochanteric bursitis of left hip 02/22/2014  . Osteoarthritis of left hip 01/25/2014  . Osteoarthritis of right knee 01/25/2014    Solon Palm PT  03/26/2015, 4:22 PM  [facility] [address] [place], [place], [address] Phone: [phone]   Fax:  [phone]

## 2015-03-26 NOTE — Patient Instructions (Signed)
Knee to Chest (Flexion)   Pull knee toward chest. Feel stretch in lower back or buttock area. Breathing deeply, Hold _30___ seconds. Repeat with other knee. Repeat 3____ times. Do __2__ sessions per day.  http://gt2.exer.us/225   Copyright  VHI. All rights reserved.  Solon PalmJulie Bee Hammerschmidt, PT 03/26/2015 3:17 PM  [facility] [address] [place], [place] [address]  [phone] (office) [phone] (fax)

## 2015-03-27 ENCOUNTER — Ambulatory Visit (INDEPENDENT_AMBULATORY_CARE_PROVIDER_SITE_OTHER): Payer: Medicare HMO | Admitting: Sports Medicine

## 2015-03-27 ENCOUNTER — Encounter: Payer: Self-pay | Admitting: Sports Medicine

## 2015-03-27 VITALS — BP 119/61 | HR 98 | Wt 170.0 lb

## 2015-03-27 DIAGNOSIS — M609 Myositis, unspecified: Secondary | ICD-10-CM | POA: Diagnosis not present

## 2015-03-27 DIAGNOSIS — M791 Myalgia: Secondary | ICD-10-CM | POA: Diagnosis not present

## 2015-03-27 DIAGNOSIS — M4316 Spondylolisthesis, lumbar region: Secondary | ICD-10-CM | POA: Diagnosis not present

## 2015-03-27 DIAGNOSIS — IMO0001 Reserved for inherently not codable concepts without codable children: Secondary | ICD-10-CM

## 2015-03-27 DIAGNOSIS — M5412 Radiculopathy, cervical region: Secondary | ICD-10-CM | POA: Insufficient documentation
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
## 2015-03-27 NOTE — Assessment & Plan Note (Signed)
Did extremely well with complete relief of pain after an L4-L5 bilateral transforaminal epidural. She continues to do physical therapy and improved however she continues to have significant pain at her left sacroiliac joint. X-rays were negative. She did take a hydrocodone earlier and had become nauseated, so we gave her Phenergan and Toradol in the office, and nausea and pain resolved. Today we will try to determine whether a sacroiliac joint injection relieves her pain, if not, we can proceed with further epidurals versus facet injections in search of her axial pain generator. Return in one month.

## 2015-03-27 NOTE — Assessment & Plan Note (Signed)
Left periscapular radiculopathy, patient does have a MRI of the cervical spine from a year ago. She will do additional physical therapy of the cervical spine, and if insufficient response in one month we can certainly consider intervention based on the MRI results.

## 2015-03-31 ENCOUNTER — Encounter: Payer: Self-pay | Admitting: Physical Therapy

## 2015-03-31 ENCOUNTER — Ambulatory Visit (INDEPENDENT_AMBULATORY_CARE_PROVIDER_SITE_OTHER): Payer: Medicare HMO | Admitting: Physical Therapy

## 2015-03-31 DIAGNOSIS — M545 Low back pain, unspecified: Secondary | ICD-10-CM

## 2015-03-31 DIAGNOSIS — M6281 Muscle weakness (generalized): Secondary | ICD-10-CM

## 2015-03-31 DIAGNOSIS — M546 Pain in thoracic spine: Secondary | ICD-10-CM

## 2015-03-31 DIAGNOSIS — M542 Cervicalgia: Secondary | ICD-10-CM

## 2015-03-31 NOTE — Therapy (Signed)
[facility] [address] [place], [place], [address] Phone: [phone]   Fax:  [phone]  Physical Therapy Treatment  Patient Details  Name: Robin Malone MRN: [id number] Date of Birth: [birth year] Referring Provider:  Monica Bectonhekkekandam, Thomas J,*  Encounter Date: 03/31/2015      PT End of Session - 03/31/15 1150    Visit Number 5   Number of Visits 12   Date for PT Re-Evaluation 04/28/15   PT Start Time 1152   PT Stop Time 1242   PT Time Calculation (min) 50 min   Activity Tolerance Patient tolerated treatment well      History reviewed. No pertinent past medical history.  History reviewed. No pertinent past surgical history.  There were no vitals filed for this visit.  Visit Diagnosis:  Pain of lumbar spine - Plan: PT plan of care cert/re-cert  Muscle weakness-general - Plan: PT plan of care cert/re-cert  Thoracic back pain, unspecified back pain laterality - Plan: PT plan of care cert/re-cert  Neck pain - Plan: PT plan of care cert/re-cert      Subjective Assessment - 03/31/15 1149    Subjective Pt had an SIJ injection last week and we have recieved a referral to work on her neck also.  This will be assessed today. She reports she had neck surgery in the 1990's and this accident has flared up her neck., she was living with the neck pain   Currently in Pain? Yes   Pain Score 7    Pain Location Neck  between shoulder blades, middle of back    Pain Descriptors / Indicators Stabbing  kinfing in her back.    Pain Type Acute pain   Pain Frequency Constant   Aggravating Factors  lying down and performing household activiites   Effect of Pain on Daily Activities ibuprofen             [facility] PT Assessment - 03/31/15 0001    Assessment   Medical Diagnosis Lt lumbar radiculitis, cervical radiculits   Onset Date 03/11/14   Next MD Visit one month   Posture/Postural Control   Posture/Postural Control  Postural limitations   Postural Limitations Flexed trunk;Left pelvic obliquity;Decreased lumbar lordosis   ROM / Strength   AROM / PROM / Strength AROM;Strength   AROM   Overall AROM Comments --  bilat shoulders WFL   AROM Assessment Site Cervical   Cervical Flexion WNL   Cervical Extension 12   Cervical - Right Side Bend 10   Cervical - Left Side Bend 5   Cervical - Right Rotation 30  limited by pain   Cervical - Left Rotation 30  limited by pain   Strength   Strength Assessment Site --  bilat UE's WNL however some neck pain with resisted ER    Right Hip Flexion 5/5   Left Hip Extension 4/5   Palpation   Palpation tightness in bilat thoracic paraspinals and into bilat rhomboids.                      [facility] Treatment/Exercise - 03/31/15 0001    Exercises   Exercises Shoulder   Lumbar Exercises: Supine   Bridge 20 reps   Knee/Hip Exercises: Seated   Other Seated Knee Exercises 20 reps sit to/from stand   Shoulder Exercises: Supine   Horizontal ABduction Both;20 reps;Theraband   Theraband Level (Shoulder Horizontal ABduction) Level 1 (Yellow)   Other Supine Exercises 30 reps  head presses.   Modalities   Modalities Electrical Stimulation;Moist Heat   Moist Heat Therapy   Number Minutes Moist Heat 15 Minutes   Moist Heat Location --  thoracic area   Electrical Stimulation   Electrical Stimulation Location --  thoracic area   Electrical Stimulation Action IFC   Electrical Stimulation Parameters to tolerance   Electrical Stimulation Goals Pain                PT Education - 03/31/15 1216    Education provided Yes   Education Details HEP   Person(s) Educated Patient   Methods Explanation;Demonstration;Handout   Comprehension Returned demonstration;Verbalized understanding             PT Long Term Goals - 03/31/15 1230    PT LONG TERM GOAL #1   Title I with advanced HEP   Status On-going   PT LONG TERM GOAL #2   Title improve  FOTO =/> CJ level   Status On-going   PT LONG TERM GOAL #3   Title sit with Lt leg crossed/ foot on Rt knee to donn panty hose   Status On-going   PT LONG TERM GOAL #4   Title increase strength bilat hip abduction and extension =/> 5-/5   Status On-going   PT LONG TERM GOAL #5   Title play golf without significant pain the next day in her back   Status On-going   Additional Long Term Goals   Additional Long Term Goals Yes   PT LONG TERM GOAL #6   Title increase cervical rotation =/> 45 degrees bilat   Time 4   Period Weeks   Status New   PT LONG TERM GOAL #7   Title report neck/thoracic pain reduced to baseline   Time 4   Period Weeks   Status New               Plan - 03/31/15 1236    Clinical Impression Statement Neck and thoracic spine assessed and added to POC    Pt will benefit from skilled therapeutic intervention in order to improve on the following deficits Decreased strength;Pain;Postural dysfunction;Improper body mechanics;Decreased range of motion   Rehab Potential Good   PT Frequency 2x / week   PT Duration 4 weeks   PT Treatment/Interventions Electrical Stimulation;Cryotherapy;Manual techniques;Ultrasound;Therapeutic exercise;Patient/family education;Therapeutic activities;Moist Heat;Passive range of motion   PT Next Visit Plan cervical and lumbar stab exercise, STW to thoracic area if still sore.    Consulted and Agree with Plan of Care Patient        Problem List Patient Active Problem List   Diagnosis Date Noted  . Left cervical radiculopathy 03/27/2015  . Spondylolisthesis at L4-L5 level 02/17/2015  . Trochanteric bursitis of left hip 02/22/2014  . Osteoarthritis of left hip 01/25/2014  . Osteoarthritis of right knee 01/25/2014    Roderic Scarce, PT 03/31/2015, 12:37 PM  [facility] [address] [place], [place], [address] Phone: [phone]   Fax:  [phone]

## 2015-03-31 NOTE — Patient Instructions (Signed)
Side Pull: [place] [phone]  On back, knees bent, feet flat. Arms perpendicular to body, shoulder level, elbows straight but relaxed. Pull arms out to sides, elbows straight. Resistance band comes across collarbones, hands toward floor. Hold momentarily. Slowly return to starting position. Repeat _20__ times. Band color __yello___   Shoulder Rotation: Double Arm   On back, knees bent, feet flat, elbows tucked at sides, bent 90, hands palms up. Pull hands apart and down toward floor, keeping elbows near sides. Hold momentarily. Slowly return to starting position. Repeat _20__ times. Band color __yellow____   Copyright  VHI. All rights reserved.  Bridging   Slowly raise buttocks from floor, keeping stomach tight. Repeat _20___ times per set. Do __1__ sets per session. Do _1___ sessions per day.  http://orth.exer.us/1096   Copyright  VHI. All rights reserved.  Sitting to Standing   With straight back, tighten stomach, place right leg back under chair, lean slightly forward and stand. Repeat __10__ times per set. Do _2___ sets per session. Do _1___ sessions per day. Keep chest lifting ( stand tall)  http://orth.exer.us/1140   Copyright  VHI. All rights reserved.  WALKING  Walking is a great form of exercise to increase your strength, endurance and overall fitness.  A walking program can help you start slowly and gradually build endurance as you go.  Everyone's ability is different, so each person's starting point will be different.  You do not have to follow them exactly.  The are just samples. You should simply find out what's right for you and stick to that program.   In the beginning, you'll start off walking 2-3 times a day for short distances.  As you get stronger, you'll be walking further at just 1-2 times per day.  A. You Can Walk For A Certain Length Of Time Each Day    Walk 5 minutes 3 times per day.  Increase 2 minutes every 2 days (3 times per day).  Work up  to 25-30 minutes (1-2 times per day).   Example:   Day 1-2 5 minutes 3 times per day   Day 7-8 12 minutes 2-3 times per day   Day 13-14 25 minutes 1-2 times per day  B. You Can Walk For a Certain Distance Each Day     Distance can be substituted for time.    Example:   3 trips to mailbox (at road)   3 trips to corner of block   3 trips around the block  C. Go to local [employer] and use the track.    Walk for distance ____ around track  Or time ____ minutes  D. Walk ____ Jog ____ Run ___  Please only do the exercises that your therapist has initialed and dated

## 2015-04-02 ENCOUNTER — Ambulatory Visit (INDEPENDENT_AMBULATORY_CARE_PROVIDER_SITE_OTHER): Payer: Medicare HMO | Admitting: Physical Therapy

## 2015-04-02 DIAGNOSIS — M545 Low back pain, unspecified: Secondary | ICD-10-CM

## 2015-04-02 DIAGNOSIS — M542 Cervicalgia: Secondary | ICD-10-CM

## 2015-04-02 DIAGNOSIS — M6281 Muscle weakness (generalized): Secondary | ICD-10-CM | POA: Diagnosis not present

## 2015-04-02 DIAGNOSIS — M546 Pain in thoracic spine: Secondary | ICD-10-CM

## 2015-04-02 NOTE — Therapy (Signed)
[facility] [address] [place], [place], [address] Phone: [phone]   Fax:  [phone]  Physical Therapy Treatment  Patient Details  Name: Robin Malone MRN: [id number] Date of Birth: 04/23/1934 Referring Provider:  Monica Bectonhekkekandam, Thomas J,*  Encounter Date: 04/02/2015      PT End of Session - 04/02/15 1407    Visit Number 6   Number of Visits 12   Date for PT Re-Evaluation 04/28/15   PT Start Time 1402   PT Stop Time 1446   PT Time Calculation (min) 44 min      No past medical history on file.  No past surgical history on file.  There were no vitals filed for this visit.  Visit Diagnosis:  Pain of lumbar spine  Muscle weakness-general  Thoracic back pain, unspecified back pain laterality  Neck pain      Subjective Assessment - 04/02/15 1405    Subjective Pt reports the injection to SI joint helped; her main concern is her neck/upper thoracic pain.    Currently in Pain? Yes   Pain Score 8    Pain Location Neck   Pain Orientation Left   Pain Descriptors / Indicators Constant   Aggravating Factors  any physical activity like mopping, taking trash out, etc.    Pain Relieving Factors medication and rest             [facility] - 04/02/15 0001    Assessment   Medical Diagnosis Lt lumbar radiculitis, cervical radiculits   Onset Date 03/11/14             [facility]/Exercise - 04/02/15 0001    Exercises   Exercises Neck;Shoulder;Lumbar   Neck Exercises: Seated   Other Seated Exercise Elbows on knees: cervical diagonals x 5 reps each direction. VC for form.    Other Seated Exercise Shoulder shrugs x 10; Rowing with yellow band x 10    Neck Exercises: Supine   Cervical Isometrics --  head presses x 10    Other Supine Exercise thoracic lifts x 10    Lumbar Exercises: Machines for Strengthening   Stationary Bike Nustep L5 x 5 min  with arms    Shoulder Exercises:  Stretch   Other Shoulder Stretches upper trap/scalene stretch with towel anchoring shoulder x 30 sec x 2 reps each side.    Modalities   Modalities Electrical Stimulation;Moist Heat   Moist Heat Therapy   Number Minutes Moist Heat 15 Minutes   Moist Heat Location --  thoracic    Electrical Stimulation   Electrical Stimulation Location thoracic paraspinals (T3 and T8 paraspinals)   Electrical Stimulation Action premod to each area    Electrical Stimulation Parameters to tolerance    Electrical Stimulation Goals Pain                PT Education - 04/02/15 1723    Education provided Yes   Education Details HEP - instructed pt to add lateral neck flex stretch    Person(s) Educated Patient   Methods Explanation;Demonstration   Comprehension Verbalized understanding             PT Long Term Goals - 03/31/15 1230    PT LONG TERM GOAL #1   Title I with advanced HEP   Status On-going   PT LONG TERM GOAL #2   Title improve FOTO =/> CJ level   Status On-going   PT LONG TERM GOAL #3   Title sit  with Lt leg crossed/ foot on Rt knee to donn panty hose   Status On-going   PT LONG TERM GOAL #4   Title increase strength bilat hip abduction and extension =/> 5-/5   Status On-going   PT LONG TERM GOAL #5   Title play golf without significant pain the next day in her back   Status On-going   Additional Long Term Goals   Additional Long Term Goals Yes   PT LONG TERM GOAL #6   Title increase cervical rotation =/> 45 degrees bilat   Time 4   Period Weeks   Status New   PT LONG TERM GOAL #7   Title report neck/thoracic pain reduced to baseline   Time 4   Period Weeks   Status New               Plan - 04/02/15 1719    Clinical Impression Statement Pt tearful at times during session secondary to "stress" (per pt).  Pt noted symptoms in midback were resolved after session. Pt continues to be very limited in cervical ROM (sidebending).  Progressing towards goals.       Pt will benefit from skilled therapeutic intervention in order to improve on the following deficits Decreased strength;Pain;Postural dysfunction;Improper body mechanics;Decreased range of motion   Rehab Potential Good   PT Frequency 2x / week   PT Duration 4 weeks   PT Treatment/Interventions Electrical Stimulation;Cryotherapy;Manual techniques;Ultrasound;Therapeutic exercise;Patient/family education;Therapeutic activities;Moist Heat;Passive range of motion   PT Next Visit Plan cervical and lumbar stab exercise, STW to thoracic area if still sore.    Consulted and Agree with Plan of Care Patient        Problem List Patient Active Problem List   Diagnosis Date Noted  . Left cervical radiculopathy 03/27/2015  . Spondylolisthesis at L4-L5 level 02/17/2015  . Trochanteric bursitis of left hip 02/22/2014  . Osteoarthritis of left hip 01/25/2014  . Osteoarthritis of right knee 01/25/2014   Mayer Camel, PTA 04/02/2015 5:24 PM  [facility] [address] [place], [place], [address] Phone: [phone]   Fax:  [phone]

## 2015-04-09 ENCOUNTER — Ambulatory Visit (INDEPENDENT_AMBULATORY_CARE_PROVIDER_SITE_OTHER): Payer: Medicare HMO | Admitting: Physical Therapy

## 2015-04-09 DIAGNOSIS — M6281 Muscle weakness (generalized): Secondary | ICD-10-CM

## 2015-04-09 DIAGNOSIS — M545 Low back pain, unspecified: Secondary | ICD-10-CM

## 2015-04-09 DIAGNOSIS — M542 Cervicalgia: Secondary | ICD-10-CM | POA: Diagnosis not present

## 2015-04-09 DIAGNOSIS — M546 Pain in thoracic spine: Secondary | ICD-10-CM | POA: Diagnosis not present

## 2015-04-09 NOTE — Therapy (Signed)
[facility] [address] [place], [place], [address] Phone: [phone]   Fax:  [phone]  Physical Therapy Treatment  Patient Details  Name: Robin Malone MRN: [id number] Date of Birth: 06/30/1934 Referring Provider:  Thekkekandam, Thomas J,*  Encounter Date: 04/09/2015      PT End of Session - 04/09/15 1430    Visit Number 7   Number of Visits 12   Date for PT Re-Evaluation 04/28/15   PT Start Time 1431   PT Stop Time 1510   PT Time Calculation (min) 39 min   Activity Tolerance Patient tolerated treatment well;No increased pain      No past medical history on file.  No past surgical history on file.  There were no vitals filed for this visit.  Visit Diagnosis:  Pain of lumbar spine  Muscle weakness-general  Thoracic back pain, unspecified back pain laterality  Neck pain      Subjective Assessment - 04/09/15 1432    Subjective Pt reports her car is irritating her mid-back when driving.  Able to don pantyhose now; feels like legs are getting stronger.    Currently in Pain? No/denies            [facility] PT Assessment - 04/09/15 0001    Assessment   Medical Diagnosis Lt lumbar radiculitis, cervical radiculits   Onset Date 03/11/14   AROM   Cervical Extension 30   Cervical - Right Side Bend 15   Cervical - Left Side Bend 20   Cervical - Right Rotation 35   Cervical - Left Rotation 35            [facility] Adult PT Treatment/Exercise - 04/09/15 0001    Neck Exercises: Machines for Strengthening   UBE (Upper Arm Bike) L2: 2 min each way  - standing    Neck Exercises: Theraband   Shoulder External Rotation 10 reps;Red  Supine: bilat simultaneously   Shoulder External Rotation Limitations VC for form.    Neck Exercises: Supine   Cervical Isometrics --  Head presses x 10 with 5 sec hold.    Upper Extremity D2 10 reps;Theraband  (shoulder diagonals) each direction    Theraband Level (UE D2)  Level 2 (Red)   Other Supine Exercise thoracic lifts x 10    Lumbar Exercises: Stretches   Lower Trunk Rotation 5 reps  arms in T.   Lumbar Exercises: Supine   Bridge 20 reps   Shoulder Exercises: Supine   Horizontal ABduction Both;10 reps  2 sets    Theraband Level (Shoulder Horizontal ABduction) Level 2 (Red)   Shoulder Exercises: Seated   Row 10 reps;Strengthening;Both;Theraband   Theraband Level (Shoulder Row) Level 3 (Green)   Shoulder Exercises: Stretch   Corner Stretch 3 reps;10 seconds   Other Shoulder Stretches shoulder ext stretch holding towel behind back.             PT Long Term Goals - 04/09/15 1435    PT LONG TERM GOAL #1   Title I with advanced HEP   Time 4   Period Weeks   Status On-going   PT LONG TERM GOAL #2   Title improve FOTO =/> CJ level   Time 4   Period Weeks   Status On-going   PT LONG TERM GOAL #3   Title sit with Lt leg crossed/ foot on Rt knee to donn panty hose   Time 4   Period Weeks   Status Achieved   PT LONG   TERM GOAL #4   Title increase strength bilat hip abduction and extension =/> 5-/5   Time 4   Period Weeks   Status On-going   PT LONG TERM GOAL #5   Title play golf without significant pain the next day in her back   Time 4   Period Weeks   Status On-going   PT LONG TERM GOAL #6   Title increase cervical rotation =/> 45 degrees bilat   Time 4   Period Weeks   Status On-going   PT LONG TERM GOAL #7   Title report neck/thoracic pain reduced to baseline   Time 4   Period Weeks   Status On-going               Plan - 04/09/15 1512    Clinical Impression Statement Pt demo improved neck ROM this visit; progressing towards goal of 45 deg.  Pt tolerated increased resistance with exercises and had no increase in pain during treatment. Pt has met LTG #3.    Pt will benefit from skilled therapeutic intervention in order to improve on the following deficits Decreased strength;Pain;Postural dysfunction;Improper body  mechanics;Decreased range of motion   Rehab Potential Good   PT Frequency 2x / week   PT Duration 4 weeks   PT Treatment/Interventions Electrical Stimulation;Cryotherapy;Manual techniques;Ultrasound;Therapeutic exercise;Patient/family education;Therapeutic activities;Moist Heat;Passive range of motion   PT Next Visit Plan Advance HEP. Continue cervical/lumbar stabilization exercises.    Consulted and Agree with Plan of Care Patient        Problem List Patient Active Problem List   Diagnosis Date Noted  . Left cervical radiculopathy 03/27/2015  . Spondylolisthesis at L4-L5 level 02/17/2015  . Trochanteric bursitis of left hip 02/22/2014  . Osteoarthritis of left hip 01/25/2014  . Osteoarthritis of right knee 01/25/2014   Jennifer Carlson-Long, PTA 04/09/2015 3:15 PM  [facility] [address] [place], [place], [address] Phone: [phone]   Fax:  [phone]      

## 2015-04-09 NOTE — Patient Instructions (Signed)
Flexibility: Corner Stretch   Standing in doorway  with hands just above shoulder level, lean forward until a comfortable stretch is felt across chest. Hold ___15-30_ seconds. Repeat __2-3__ times per set. Do __1__ sets per session. Do __1-2_ sessions per day.  http://orth.exer.us/343   Copyright  VHI. All rights reserved.   [facility] [address] [place], [place] [address]  [phone] (office) [phone] (fax)

## 2015-04-11 ENCOUNTER — Encounter: Payer: Medicare HMO | Admitting: Physical Therapy

## 2015-04-16 ENCOUNTER — Ambulatory Visit (INDEPENDENT_AMBULATORY_CARE_PROVIDER_SITE_OTHER): Payer: Medicare HMO | Admitting: Physical Therapy

## 2015-04-16 DIAGNOSIS — M542 Cervicalgia: Secondary | ICD-10-CM | POA: Diagnosis not present

## 2015-04-16 DIAGNOSIS — M6281 Muscle weakness (generalized): Secondary | ICD-10-CM | POA: Diagnosis not present

## 2015-04-16 DIAGNOSIS — M545 Low back pain, unspecified: Secondary | ICD-10-CM

## 2015-04-16 DIAGNOSIS — M546 Pain in thoracic spine: Secondary | ICD-10-CM | POA: Diagnosis not present

## 2015-04-16 NOTE — Patient Instructions (Addendum)
  Side Pull: Double Arm   On back, knees bent, feet flat. Arms perpendicular to body, shoulder level, elbows straight but relaxed. Pull arms out to sides, elbows straight. Resistance band comes across collarbones, hands toward floor. Hold momentarily. Slowly return to starting position. Repeat _20__ times. Band color _green____   Sash   On back, knees bent, feet flat, left hand on left hip, right hand above left. Pull right arm DIAGONALLY (hip to shoulder) across chest. Bring right arm along head toward floor. Hold momentarily. Slowly return to starting position. Repeat _20__ times. Do with left arm. Band color __green____   Shoulder Rotation: Double Arm   On back, knees bent, feet flat, elbows tucked at sides, bent 90, hands palms up. Pull hands apart and down toward floor, keeping elbows near sides. Hold momentarily. Slowly return to starting position. Repeat _20__ times. Band color __green____   Piriformis Stretch, Sitting   Sit, one ankle on opposite knee, same-side hand on crossed knee. Push down on knee, keeping spine straight. Lean torso forward, with flat back, until tension is felt in hamstrings and gluteals of crossed-leg side. Hold ___ seconds.  Repeat ___ times per session. Do ___ sessions per day.  Copyright  VHI. All rights reserved.  Abduction: Side Leg Lift (Eccentric) - Side-Lying   Lie on side. Lift top leg slightly higher than shoulder level. Keep top leg straight with body, toes pointing forward. Slowly lower for 3-5 seconds. _20__ reps per set, _1__ sets per day, _3__ days per week.   [facility] [address] [place], [place] [address]  [phone]) [phone] (fax)

## 2015-04-16 NOTE — Therapy (Signed)
[place], [place], [address] Phone: [phone]   Fax:  [phone]  Physical Therapy Treatment  Patient Details  Name: Wanell Lorenzi MRN: [id number] Date of Birth: 03/25/1934 Referring Provider:  Silverio Decamp,*  Encounter Date: 04/16/2015      PT End of Session - 04/16/15 1434    Visit Number 8   Number of Visits 12   Date for PT Re-Evaluation 04/28/15   PT Start Time 6314   PT Stop Time 9702   PT Time Calculation (min) 43 min   Activity Tolerance No increased pain;Patient tolerated treatment well      No past medical history on file.  No past surgical history on file.  There were no vitals filed for this visit.  Visit Diagnosis:  Muscle weakness-general  Thoracic back pain, unspecified back pain laterality  Neck pain  Pain of lumbar spine      Subjective Assessment - 04/16/15 1435    Subjective Pt reports her neck hasn't bothered her unless she is standing for long periods of time. Overall, feels like she is improving.    Currently in Pain? Yes   Pain Score 2    Pain Location Buttocks   Pain Orientation Left   Pain Descriptors / Indicators Dull   Aggravating Factors  mopping    Pain Relieving Factors medication and rest             [facility] PT Assessment - 04/16/15 0001    Assessment   Medical Diagnosis Lt lumbar radiculitis, cervical radiculits   Onset Date 03/11/14   Next MD Visit 04/24/15   AROM   Cervical - Right Side Bend 17   Cervical - Left Side Bend 25   Cervical - Right Rotation 50   Cervical - Left Rotation 55   Strength   Right Hip Extension 4+/5   Right Hip ABduction 4/5   Left Hip Extension 4-/5   Left Hip ABduction 3+/5                     [facility] Adult PT Treatment/Exercise - 04/16/15 0001    Lumbar Exercises: Stretches   Piriformis Stretch 2 reps;30 seconds   Lumbar Exercises: Aerobic   Stationary Bike NuStep L4: 17mn    Lumbar Exercises: Supine   Clam 20 reps  with red band around knees   Bridge 20 reps;5 seconds  with ball squeeze   Other Supine Lumbar Exercises Thoracic lifts with shoulder press with 5 sec hold x 10;    Other Supine Lumbar Exercises Cross body stretch with green strap x 4 reps each side for each leg   Shoulder Exercises: Supine   Horizontal ABduction Both;10 reps  2 sets   Theraband Level (Shoulder Horizontal ABduction) Level 3 (Green)   Other Supine Exercises shoulder diagonals with green band x 10 each direction                 PT Education - 04/16/15 1514    Education provided Yes   Education Details HEP    Person(s) Educated Patient   Methods Handout;Explanation   Comprehension Verbalized understanding             PT Long Term Goals - 04/16/15 1517    PT LONG TERM GOAL #1   Title I with advanced HEP   Time 4   Period Weeks   Status On-going   PT LONG TERM GOAL #2   Title improve  FOTO =/> CJ level   Time 4   Period Weeks   Status On-going   PT LONG TERM GOAL #3   Title sit with Lt leg crossed/ foot on Rt knee to donn panty hose   Time 4   Period Weeks   Status Achieved   PT LONG TERM GOAL #4   Title increase strength bilat hip abduction and extension =/> 5-/5   Time 4   Period Weeks   Status On-going   PT LONG TERM GOAL #5   Title play golf without significant pain the next day in her back   Time 4   Period Weeks   Status On-going   PT LONG TERM GOAL #6   Title increase cervical rotation =/> 45 degrees bilat   Time 4   Period Weeks   Status Achieved   PT LONG TERM GOAL #7   Title report neck/thoracic pain reduced to baseline   Time 4   Period Weeks   Status Achieved               Plan - 04/16/15 1514    Clinical Impression Statement Pt demo improved neck ROM compared to last visit; met LTG #6.  Pt tolerated increased resistance without increase in pain and buttock pain was resolved by end of session.  Pt continues to  demonstrate decreased hip strength. Pt making good progress towards remaining goals.    Pt will benefit from skilled therapeutic intervention in order to improve on the following deficits Decreased strength;Pain;Postural dysfunction;Improper body mechanics;Decreased range of motion   PT Frequency 2x / week   PT Duration 4 weeks   PT Treatment/Interventions Electrical Stimulation;Cryotherapy;Manual techniques;Ultrasound;Therapeutic exercise;Patient/family education;Therapeutic activities;Moist Heat;Passive range of motion   PT Next Visit Plan Advance HEP. Continue cervical/lumbar stabilization exercises.    Consulted and Agree with Plan of Care Patient        Problem List Patient Active Problem List   Diagnosis Date Noted  . Left cervical radiculopathy 03/27/2015  . Spondylolisthesis at L4-L5 level 02/17/2015  . Trochanteric bursitis of left hip 02/22/2014  . Osteoarthritis of left hip 01/25/2014  . Osteoarthritis of right knee 01/25/2014   Kerin Perna, PTA 04/16/2015 3:27 PM   [place], [place], [address] Phone: [phone]   Fax:  [phone]

## 2015-04-18 ENCOUNTER — Encounter: Payer: Medicare HMO | Admitting: Physical Therapy

## 2015-04-21 ENCOUNTER — Ambulatory Visit (INDEPENDENT_AMBULATORY_CARE_PROVIDER_SITE_OTHER): Payer: Medicare HMO | Admitting: Physical Therapy

## 2015-04-21 DIAGNOSIS — M542 Cervicalgia: Secondary | ICD-10-CM | POA: Diagnosis not present

## 2015-04-21 DIAGNOSIS — M545 Low back pain, unspecified: Secondary | ICD-10-CM

## 2015-04-21 DIAGNOSIS — M546 Pain in thoracic spine: Secondary | ICD-10-CM | POA: Diagnosis not present

## 2015-04-21 DIAGNOSIS — M6281 Muscle weakness (generalized): Secondary | ICD-10-CM | POA: Diagnosis not present

## 2015-04-21 NOTE — Therapy (Addendum)
[place], [place], [address] Phone: [phone]   Fax:  [phone]  Physical Therapy Treatment  Patient Details  Name: Robin Malone MRN: [id number] Date of Birth: July 14, 1934 Referring Provider:  Silverio Decamp,*  Encounter Date: 04/21/2015      PT End of Session - 04/21/15 1110    Visit Number 9   Number of Visits 12   Date for PT Re-Evaluation 04/28/15   PT Start Time 1101   PT Stop Time 1141   PT Time Calculation (min) 40 min   Activity Tolerance Patient tolerated treatment well      No past medical history on file.  No past surgical history on file.  There were no vitals filed for this visit.  Visit Diagnosis:  Muscle weakness-general  Thoracic back pain, unspecified back pain laterality  Neck pain  Pain of lumbar spine      Subjective Assessment - 04/21/15 1111    Subjective Pt reports she was able to clean house without pain for first time. Pt feels the exercises have been helping.    Currently in Pain? No/denies            [facility] PT Assessment - 04/21/15 0001    Assessment   Medical Diagnosis Lt lumbar radiculitis, cervical radiculits   Onset Date 03/11/14   Next MD Visit 04/24/15                     [facility] Adult PT Treatment/Exercise - 04/21/15 0001    Neck Exercises: Machines for Strengthening   UBE (Upper Arm Bike) L2: 2 min each way     Knee/Hip Exercises: Stretches   Passive Hamstring Stretch 2 reps   ITB Stretch 2 reps;20 seconds   Knee/Hip Exercises: Standing   Other Standing Knee Exercises Standing lunge for hip flexor stretch x 3 reps each side   Knee/Hip Exercises: Supine   Bridges 1 set;10 reps  with 10 sec hold in extension   Other Supine Knee Exercises Bridging with marching x 10 steps x 3 sets    Other Supine Knee Exercises Lower trunk rotation with arms in T x 30 sec x 3 reps each side    Knee/Hip Exercises: Sidelying   Clams x 20 each side    Shoulder Exercises: Supine   Horizontal ABduction Strengthening;Both;10 reps  3 sets    Theraband Level (Shoulder Horizontal ABduction) Level 3 (Green)   External Rotation Both;Strengthening;10 reps  x 3 sets    Theraband Level (Shoulder External Rotation) Level 3 (Green)   Other Supine Exercises shoulder diagonals with green band x 10 each direction    Shoulder Exercises: Seated   Row Strengthening;Both;10 reps   Theraband Level (Shoulder Row) Level 3 (Green)   Shoulder Exercises: Standing   Row Both;20 reps;Theraband   Theraband Level (Shoulder Row) Level 3 (Green)   Other Standing Exercises Standing - resisted swing through (golf sim) with yellow band x 20, simulated golf swing with cane x 5 reps - no pain with either                       PT Long Term Goals - 04/16/15 1517    PT LONG TERM GOAL #1   Title I with advanced HEP   Time 4   Period Weeks   Status On-going   PT LONG TERM GOAL #2   Title improve FOTO =/> CJ level   Time 4  Period Weeks   Status On-going   PT LONG TERM GOAL #3   Title sit with Lt leg crossed/ foot on Rt knee to donn panty hose   Time 4   Period Weeks   Status Achieved   PT LONG TERM GOAL #4   Title increase strength bilat hip abduction and extension =/> 5-/5   Time 4   Period Weeks   Status On-going   PT LONG TERM GOAL #5   Title play golf without significant pain the next day in her back   Time 4   Period Weeks   Status On-going   PT LONG TERM GOAL #6   Title increase cervical rotation =/> 45 degrees bilat   Time 4   Period Weeks   Status Achieved   PT LONG TERM GOAL #7   Title report neck/thoracic pain reduced to baseline   Time 4   Period Weeks   Status Achieved               Plan - 04/21/15 1137    Clinical Impression Statement Pt tolerated all exercises well without any production of pain. Pt reports hips "were tired" after the simulated golf swing with yellow band. Pt making good  progress towards all goals.    Pt will benefit from skilled therapeutic intervention in order to improve on the following deficits Decreased strength;Pain;Postural dysfunction;Improper body mechanics;Decreased range of motion   Rehab Potential Good   PT Frequency 2x / week   PT Duration 4 weeks   PT Treatment/Interventions Electrical Stimulation;Cryotherapy;Manual techniques;Ultrasound;Therapeutic exercise;Patient/family education;Therapeutic activities;Moist Heat;Passive range of motion   PT Next Visit Plan Assess goals/ MMT/ complete FOTO.  Assess need for continued therapy vs d/c.    Consulted and Agree with Plan of Care Patient        Problem List Patient Active Problem List   Diagnosis Date Noted  . Left cervical radiculopathy 03/27/2015  . Spondylolisthesis at L4-L5 level 02/17/2015  . Trochanteric bursitis of left hip 02/22/2014  . Osteoarthritis of left hip 01/25/2014  . Osteoarthritis of right knee 01/25/2014   Kerin Perna, PTA 04/21/2015 5:02 PM  [facility] [place], [place], [address] Phone: [phone]   Fax:  [phone]     PHYSICAL THERAPY DISCHARGE SUMMARY  Visits from Start of Care: 9  Current functional level related to goals / functional outcomes:  goals partially accomplished    Remaining deficits: unknown   Education / Equipment: HEP Plan:                                                    Patient goals were partially met. Patient is being discharged due to a change in medical status.  ?????    Celyn P. Helene Kelp, PT, MPH 05/15/15 11:57

## 2015-04-23 ENCOUNTER — Encounter: Payer: Medicare HMO | Admitting: Physical Therapy

## 2015-04-24 ENCOUNTER — Ambulatory Visit (INDEPENDENT_AMBULATORY_CARE_PROVIDER_SITE_OTHER): Payer: Medicare HMO | Admitting: Sports Medicine

## 2015-04-24 ENCOUNTER — Encounter: Payer: Self-pay | Admitting: Sports Medicine

## 2015-04-24 VITALS — BP 114/68 | HR 83 | Ht 64.0 in | Wt 167.0 lb

## 2015-04-24 DIAGNOSIS — M7062 Trochanteric bursitis, left hip: Secondary | ICD-10-CM | POA: Diagnosis not present

## 2015-04-24 DIAGNOSIS — M4316 Spondylolisthesis, lumbar region: Secondary | ICD-10-CM | POA: Diagnosis not present

## 2015-04-24 DIAGNOSIS — M5412 Radiculopathy, cervical region: Secondary | ICD-10-CM

## 2015-04-24 NOTE — Assessment & Plan Note (Addendum)
Left C5/6. She will get an epidural in a month or 2 but will let us know. MRI does show extensive DDD. Patient given the MRI disc.

## 2015-04-24 NOTE — Assessment & Plan Note (Signed)
Axial back pain has resolved after SIJ injection at the last visit. Xrays did show sclerosis and degenerative changes at both SIJ.

## 2015-04-24 NOTE — Assessment & Plan Note (Signed)
Trochanteric bursa injection as above. Patient declined this injection in March, 2016.

## 2015-04-24 NOTE — Progress Notes (Signed)
  Subjective:    CC: Follow-up  HPI: Robin Malone has a very complex lumbar spine, she did well with regards to her discogenic axial pain and radicular pain with an L4-L5 epidural, subsequently she had sacroiliac joint mediated pain with x-rays do confirm degenerative changes in both SI joints, this has responded 100% to an SI joint injection at the last visit.  Unfortunately she continues to have some pain over the left hip, worse when laying down on the ipsilateral side. Moderate, persistent without radiation.  She also has some left-sided cervical radiculopathy predominantly in the C5 and C6 distributions, she did have an MRI the results of which are dictated below. She is not yet ready to proceed with interventional treatment, physical therapy has not been effective.  Past medical history, Surgical history, Family history not pertinant except as noted below, Social history, Allergies, and medications have been entered into the medical record, reviewed, and no changes needed.   Review of Systems: No fevers, chills, night sweats, weight loss, chest pain, or shortness of breath.   Objective:    General: Well Developed, well nourished, and in no acute distress.  Neuro: Alert and oriented x3, extra-ocular muscles intact, sensation grossly intact.  HEENT: Normocephalic, atraumatic, pupils equal round reactive to light, neck supple, no masses, no lymphadenopathy, thyroid nonpalpable.  Skin: Warm and dry, no rashes. Cardiac: Regular rate and rhythm, no murmurs rubs or gallops, no lower extremity edema.  Respiratory: Clear to auscultation bilaterally. Not using accessory muscles, speaking in full sentences. Left Hip: ROM IR: 60 Deg, ER: 60 Deg, Flexion: 120 Deg, Extension: 100 Deg, Abduction: 45 Deg, Adduction: 45 Deg Strength IR: 5/5, ER: 5/5, Flexion: 5/5, Extension: 5/5, Abduction: 5/5, Adduction: 5/5 Pelvic alignment unremarkable to inspection and palpation. Standing hip rotation and gait  without trendelenburg / unsteadiness. Greater trochanter with tenderness to palpation. No tenderness over piriformis. No SI joint tenderness and normal minimal SI movement.  Procedure:  Injection of left trochanteric bursa Consent obtained and verified. Time-out conducted. Noted no overlying erythema, induration, or other signs of local infection. Skin prepped in a sterile fashion. Topical analgesic spray: Ethyl chloride. Completed without difficulty. Meds: Using a spinal needle and injected a total of 1 mL kenalog 40, 4 mL lidocaine into the bursa. Pain immediately improved suggesting accurate placement of the medication. Advised to call if fevers/chills, erythema, induration, drainage, or persistent bleeding.  Cervical spine MRI shows C5-C6 and C6-C7 central disc protrusions.  Impression and Recommendations:

## 2015-04-25 ENCOUNTER — Encounter: Payer: Medicare HMO | Admitting: Physical Therapy

## 2015-05-22 ENCOUNTER — Ambulatory Visit: Payer: Medicare HMO | Admitting: Sports Medicine

## 2015-08-06 NOTE — Addendum Note (Signed)
Addended by: Josepha Barbier, Darl Pikes E on: 08/06/2015 08:24 AM   Modules accepted: Orders

## 2015-10-07 ENCOUNTER — Ambulatory Visit (INDEPENDENT_AMBULATORY_CARE_PROVIDER_SITE_OTHER): Payer: Medicare HMO | Admitting: Sports Medicine

## 2015-10-07 ENCOUNTER — Encounter: Payer: Self-pay | Admitting: Sports Medicine

## 2015-10-07 VITALS — BP 119/64 | HR 86 | Wt 167.0 lb

## 2015-10-07 DIAGNOSIS — M7062 Trochanteric bursitis, left hip: Secondary | ICD-10-CM

## 2015-10-07 DIAGNOSIS — M1711 Unilateral primary osteoarthritis, right knee: Secondary | ICD-10-CM

## 2015-10-07 DIAGNOSIS — M4316 Spondylolisthesis, lumbar region: Secondary | ICD-10-CM

## 2015-10-07 DIAGNOSIS — M79674 Pain in right toe(s): Secondary | ICD-10-CM | POA: Insufficient documentation

## 2015-10-07 MED ORDER — PREGABALIN 50 MG PO CAPS: 50.0000 mg | ORAL_CAPSULE | Freq: Three times a day (TID) | ORAL | Status: DC

## 2015-10-07 NOTE — Assessment & Plan Note (Signed)
Injection placed into the right second proximal interphalangeal joint.

## 2015-10-07 NOTE — Assessment & Plan Note (Signed)
Right knee injection as above, previous injection was 7 months ago.

## 2015-10-07 NOTE — Progress Notes (Signed)
  Subjective:    CC: couple of pains  HPI: Right knee osteoarthritis: Did fantastic after injection 7 months ago, desires repeat injection today, pain is moderate, persistent and localized at the joint lines.  Right toe pain: Right second PIP, exquisitely painful, swollen.  Past medical history, Surgical history, Family history not pertinant except as noted below, Social history, Allergies, and medications have been entered into the medical record, reviewed, and no changes needed.   Review of Systems: No fevers, chills, night sweats, weight loss, chest pain, or shortness of breath.   Objective:    General: Well Developed, well nourished, and in no acute distress.  Neuro: Alert and oriented x3, extra-ocular muscles intact, sensation grossly intact.  HEENT: Normocephalic, atraumatic, pupils equal round reactive to light, neck supple, no masses, no lymphadenopathy, thyroid nonpalpable.  Skin: Warm and dry, no rashes. Cardiac: Regular rate and rhythm, no murmurs rubs or gallops, no lower extremity edema.  Respiratory: Clear to auscultation bilaterally. Not using accessory muscles, speaking in full sentences.  Procedure: Real-time Ultrasound Guided Injection of right knee Device: GE Logiq E  Verbal informed consent obtained.  Time-out conducted.  Noted no overlying erythema, induration, or other signs of local infection.  Skin prepped in a sterile fashion.  Local anesthesia: Topical Ethyl chloride.  With sterile technique and under real time ultrasound guidance:  2 mL kenalog 40, 4 mL lidocaine injected easily. Completed without difficulty  Pain immediately resolved suggesting accurate placement of the medication.  Advised to call if fevers/chills, erythema, induration, drainage, or persistent bleeding.  Images permanently stored and available for review in the ultrasound unit.  Impression: Technically successful ultrasound guided injection.  Procedure: Real-time Ultrasound Guided  Injection of right second PIP Device: GE Logiq E  Verbal informed consent obtained.  Time-out conducted.  Noted no overlying erythema, induration, or other signs of local infection.  Skin prepped in a sterile fashion.  Local anesthesia: Topical Ethyl chloride.  With sterile technique and under real time ultrasound guidance:  0.5 mL kenalog 40, 0.5 mL lidocaine injected easily. Completed without difficulty  Pain immediately resolved suggesting accurate placement of the medication.  Advised to call if fevers/chills, erythema, induration, drainage, or persistent bleeding.  Images permanently stored and available for review in the ultrasound unit.  Impression: Technically successful ultrasound guided injection.  Impression and Recommendations:

## 2015-10-07 NOTE — Assessment & Plan Note (Signed)
We will hope for systemic effect after knee and toe injections, otherwise she can return to see me in 2 weeks for a trochanteric bursa injection

## 2015-11-04 ENCOUNTER — Ambulatory Visit: Payer: Medicare HMO | Admitting: Sports Medicine

## 2015-11-13 ENCOUNTER — Encounter: Payer: Self-pay | Admitting: Sports Medicine

## 2015-11-13 ENCOUNTER — Ambulatory Visit (INDEPENDENT_AMBULATORY_CARE_PROVIDER_SITE_OTHER): Payer: Medicare HMO | Admitting: Sports Medicine

## 2015-11-13 DIAGNOSIS — M4316 Spondylolisthesis, lumbar region: Secondary | ICD-10-CM | POA: Diagnosis not present

## 2015-11-13 MED ORDER — GABAPENTIN 600 MG PO TABS: 600.0000 mg | ORAL_TABLET | Freq: Every day | ORAL | Status: DC

## 2015-11-13 NOTE — Assessment & Plan Note (Addendum)
Radicular pain, discogenic has returned. Previous epidural was in March 2016. Repeat epidural, return to see me one month later, if persistent pain we will consider repeat SI joint injection. This will be a bilateral L4-L5 transforaminal epidural. Refilling gabapentin which is effective at 600 mg daily at bedtime.

## 2015-11-13 NOTE — Progress Notes (Signed)
  Subjective:    CC: Recurrent back pain  HPI: This is a pleasant 81 year old female with lumbar radiculopathy, she does have a history of L4-L5 degenerative disc disease and spondylolisthesis, she responded very well in March of this year to a bilateral L4-L5 transforaminal epidural, she desires to repeat this injection, she's also been doing gabapentin 600 mg at bedtime, and this keeps her pain essentially completely under control. No bowel or bladder dysfunction, saddle numbness, no trauma, no constitutional symptoms.  Past medical history, Surgical history, Family history not pertinant except as noted below, Social history, Allergies, and medications have been entered into the medical record, reviewed, and no changes needed.   Review of Systems: No fevers, chills, night sweats, weight loss, chest pain, or shortness of breath.   Objective:    General: Well Developed, well nourished, and in no acute distress.  Neuro: Alert and oriented x3, extra-ocular muscles intact, sensation grossly intact.  HEENT: Normocephalic, atraumatic, pupils equal round reactive to light, neck supple, no masses, no lymphadenopathy, thyroid nonpalpable.  Skin: Warm and dry, no rashes. Cardiac: Regular rate and rhythm, no murmurs rubs or gallops, no lower extremity edema.  Respiratory: Clear to auscultation bilaterally. Not using accessory muscles, speaking in full sentences.  Impression and Recommendations:   I spent 25 minutes with this patient, greater than 50% was face-to-face time counseling regarding the above diagnoses

## 2015-11-14 ENCOUNTER — Other Ambulatory Visit: Payer: Self-pay | Admitting: Sports Medicine

## 2015-11-14 DIAGNOSIS — M4316 Spondylolisthesis, lumbar region: Secondary | ICD-10-CM

## 2015-12-16 ENCOUNTER — Ambulatory Visit
Admission: RE | Admit: 2015-12-16 | Discharge: 2015-12-16 | Disposition: A | Discharge status: Home / Self Care | Payer: Medicare HMO | Source: Ambulatory Visit | Attending: Sports Medicine | Admitting: Sports Medicine

## 2015-12-16 DIAGNOSIS — M4316 Spondylolisthesis, lumbar region: Secondary | ICD-10-CM

## 2015-12-16 MED ORDER — IOHEXOL 180 MG/ML  SOLN
1.0000 mL | Freq: Once | INTRAMUSCULAR | Status: AC | PRN
  Administered 2015-12-16: 1 mL via EPIDURAL

## 2015-12-16 MED ORDER — METHYLPREDNISOLONE ACETATE 40 MG/ML INJ SUSP (RADIOLOG
120.0000 mg | Freq: Once | INTRAMUSCULAR | Status: AC
  Administered 2015-12-16: 120 mg via EPIDURAL

## 2015-12-16 NOTE — Discharge Instructions (Signed)

## 2016-01-06 DIAGNOSIS — F419 Anxiety disorder, unspecified: Secondary | ICD-10-CM | POA: Insufficient documentation

## 2016-01-12 DIAGNOSIS — I6523 Occlusion and stenosis of bilateral carotid arteries: Secondary | ICD-10-CM | POA: Insufficient documentation

## 2016-02-12 ENCOUNTER — Encounter: Payer: Self-pay | Admitting: Sports Medicine

## 2016-02-12 ENCOUNTER — Ambulatory Visit (INDEPENDENT_AMBULATORY_CARE_PROVIDER_SITE_OTHER): Payer: Medicare HMO | Admitting: Sports Medicine

## 2016-02-12 VITALS — BP 147/67 | HR 84 | Ht 64.0 in | Wt 170.0 lb

## 2016-02-12 DIAGNOSIS — M4316 Spondylolisthesis, lumbar region: Secondary | ICD-10-CM | POA: Diagnosis not present

## 2016-02-12 DIAGNOSIS — M79674 Pain in right toe(s): Secondary | ICD-10-CM

## 2016-02-12 NOTE — Assessment & Plan Note (Signed)
Right second boutonniere's deformity, would like to touch base with podiatry in the future. Does not want me to do the referral yet.

## 2016-02-12 NOTE — Progress Notes (Signed)
  Subjective:    CC: Follow-up  HPI: This is a pleasant 82 year old female with chronic low back pain, previously she had a good response to a bilateral L4-L5 transforaminal epidural, more recently this injection did not provide much relief, we agreed to try a sacroiliac joint injection if she wasn't better, her axial pain is mostly on the left side today, and she is agreeable to proceed with left SI joint injection.  Past medical history, Surgical history, Family history not pertinant except as noted below, Social history, Allergies, and medications have been entered into the medical record, reviewed, and no changes needed.   Review of Systems: No fevers, chills, night sweats, weight loss, chest pain, or shortness of breath.   Objective:    General: Well Developed, well nourished, and in no acute distress.  Neuro: Alert and oriented x3, extra-ocular muscles intact, sensation grossly intact.  HEENT: Normocephalic, atraumatic, pupils equal round reactive to light, neck supple, no masses, no lymphadenopathy, thyroid nonpalpable.  Skin: Warm and dry, no rashes. Cardiac: Regular rate and rhythm, no murmurs rubs or gallops, no lower extremity edema.  Respiratory: Clear to auscultation bilaterally. Not using accessory muscles, speaking in full sentences.  Procedure: Real-time Ultrasound Guided Injection of left sacroiliac joint Device: GE Logiq E  Verbal informed consent obtained.  Time-out conducted.  Noted no overlying erythema, induration, or other signs of local infection.  Skin prepped in a sterile fashion.  Local anesthesia: Topical Ethyl chloride.  With sterile technique and under real time ultrasound guidance:  Spinal needle advanced into the joint, 1 mL kenalog 40, 1 mL lidocaine, 1 mL Marcaine injected easily. Completed without difficulty  Pain immediately resolved suggesting accurate placement of the medication.  Advised to call if fevers/chills, erythema, induration, drainage, or  persistent bleeding.  Images permanently stored and available for review in the ultrasound unit.  Impression: Technically successful ultrasound guided injection.  Impression and Recommendations:

## 2016-02-12 NOTE — Assessment & Plan Note (Signed)
Bilateral L4-L5 transforaminal epidural provided only mild relief. Today we are doing a left sacroiliac joint injection for diagnostic and therapeutic purposes, return in one month.

## 2016-03-11 ENCOUNTER — Ambulatory Visit: Payer: Medicare HMO | Admitting: Sports Medicine

## 2016-05-25 ENCOUNTER — Ambulatory Visit (INDEPENDENT_AMBULATORY_CARE_PROVIDER_SITE_OTHER): Payer: Medicare HMO | Admitting: Sports Medicine

## 2016-05-25 VITALS — BP 122/75 | HR 76 | Resp 18 | Wt 173.1 lb

## 2016-05-25 DIAGNOSIS — M4316 Spondylolisthesis, lumbar region: Secondary | ICD-10-CM | POA: Diagnosis not present

## 2016-05-25 DIAGNOSIS — M1612 Unilateral primary osteoarthritis, left hip: Secondary | ICD-10-CM

## 2016-05-25 NOTE — Assessment & Plan Note (Signed)
Noted in x-rays from 2015, hip joint injection as above, return in one month.

## 2016-05-25 NOTE — Assessment & Plan Note (Signed)
Only had minimal relief from bilateral L4-L5 transforaminal epidural's, a left sacroiliac joint injection at the last visit provided good relief of her back pain.

## 2016-05-25 NOTE — Progress Notes (Signed)
  Subjective:    CC: Left hip pain  HPI: This is a pleasant 82 year old female, we have treated her extensively in the past for trochanteric bursitis, she has known hip osteoarthritis on x-rays from a couple of years ago. More recently she's had pain that she localizes in the groin, moderate, persistent without radiation, worse with weightbearing, flexion, and with classic gelling.  Past medical history, Surgical history, Family history not pertinant except as noted below, Social history, Allergies, and medications have been entered into the medical record, reviewed, and no changes needed.   Review of Systems: No fevers, chills, night sweats, weight loss, chest pain, or shortness of breath.   Objective:    General: Well Developed, well nourished, and in no acute distress.  Neuro: Alert and oriented x3, extra-ocular muscles intact, sensation grossly intact.  HEENT: Normocephalic, atraumatic, pupils equal round reactive to light, neck supple, no masses, no lymphadenopathy, thyroid nonpalpable.  Skin: Warm and dry, no rashes. Cardiac: Regular rate and rhythm, no murmurs rubs or gallops, no lower extremity edema.  Respiratory: Clear to auscultation bilaterally. Not using accessory muscles, speaking in full sentences. Left Hip: ROM IR: 60 Deg, ER: 60 Deg, Flexion: 120 Deg, Extension: 100 Deg, Abduction: 45 Deg, Adduction: 45 Deg, internal rotation reproduces concordant pain Strength IR: 5/5, ER: 5/5, Flexion: 5/5, Extension: 5/5, Abduction: 5/5, Adduction: 5/5 Pelvic alignment unremarkable to inspection and palpation. Standing hip rotation and gait without trendelenburg / unsteadiness. Greater trochanter without tenderness to palpation. No tenderness over piriformis. No SI joint tenderness and normal minimal SI movement.  Procedure: Real-time Ultrasound Guided Injection of left hip joint Device: GE Logiq E  Verbal informed consent obtained.  Time-out conducted.  Noted no overlying  erythema, induration, or other signs of local infection.  Skin prepped in a sterile fashion.  Local anesthesia: Topical Ethyl chloride.  With sterile technique and under real time ultrasound guidance:  Spinal needle advanced to the femoral head/neck junction, bone contacted and 1 mL kenalog 40, 2 mL lidocaine, 2 mL Marcaine injected easily. Completed without difficulty  Pain immediately resolved suggesting accurate placement of the medication.  Advised to call if fevers/chills, erythema, induration, drainage, or persistent bleeding.  Images permanently stored and available for review in the ultrasound unit.  Impression: Technically successful ultrasound guided injection.  Impression and Recommendations:

## 2016-06-24 ENCOUNTER — Ambulatory Visit (INDEPENDENT_AMBULATORY_CARE_PROVIDER_SITE_OTHER): Payer: Medicare HMO | Admitting: Sports Medicine

## 2016-06-24 ENCOUNTER — Encounter: Payer: Self-pay | Admitting: Sports Medicine

## 2016-06-24 VITALS — BP 119/67 | HR 90 | Resp 18 | Wt 170.5 lb

## 2016-06-24 DIAGNOSIS — M1612 Unilateral primary osteoarthritis, left hip: Secondary | ICD-10-CM | POA: Diagnosis not present

## 2016-06-24 DIAGNOSIS — S81812A Laceration without foreign body, left lower leg, initial encounter: Secondary | ICD-10-CM

## 2016-06-24 NOTE — Assessment & Plan Note (Signed)
Pain is resolved after injection at the last visit

## 2016-06-24 NOTE — Assessment & Plan Note (Signed)
Closed with Dermabond.

## 2016-06-24 NOTE — Progress Notes (Signed)
  Subjective:    CC: Follow-up  HPI: Left hip primary osteoarthritis: Resolved after injection last month.  Skin laceration: Left-sided, anterior shin, occurred this morning getting out of her car. Pain is mild, improving. No longer bleeding.  Past medical history, Surgical history, Family history not pertinant except as noted below, Social history, Allergies, and medications have been entered into the medical record, reviewed, and no changes needed.   Review of Systems: No fevers, chills, night sweats, weight loss, chest pain, or shortness of breath.   Objective:    General: Well Developed, well nourished, and in no acute distress.  Neuro: Alert and oriented x3, extra-ocular muscles intact, sensation grossly intact.  HEENT: Normocephalic, atraumatic, pupils equal round reactive to light, neck supple, no masses, no lymphadenopathy, thyroid nonpalpable.  Skin: Warm and dry, no rashes. Cardiac: Regular rate and rhythm, no murmurs rubs or gallops, no lower extremity edema.  Respiratory: Clear to auscultation bilaterally. Not using accessory muscles, speaking in full sentences. Left leg: There is a 4 cm laceration involving the dermal epidermal junction on the anterior shin. No erythema, induration, hemostatic.  Laceration repair: Indication: bleeding Location: Left anterior shin Size: 4 cm Wound was closed with Dermabond Tolerated well Routine postprocedure instructions d/w pt- keep area clean and bandaged, follow up if concerns/spreading erythema/pain.    Impression and Recommendations:

## 2016-09-22 ENCOUNTER — Ambulatory Visit (INDEPENDENT_AMBULATORY_CARE_PROVIDER_SITE_OTHER): Payer: Medicare HMO | Admitting: Podiatry

## 2016-09-22 ENCOUNTER — Encounter: Payer: Self-pay | Admitting: Podiatry

## 2016-09-22 VITALS — BP 105/58 | HR 87 | Ht 64.0 in | Wt 170.0 lb

## 2016-09-22 DIAGNOSIS — M2041 Other hammer toe(s) (acquired), right foot: Secondary | ICD-10-CM

## 2016-09-22 DIAGNOSIS — S93144A Subluxation of metatarsophalangeal joint of right lesser toe(s), initial encounter: Secondary | ICD-10-CM | POA: Diagnosis not present

## 2016-09-22 DIAGNOSIS — M79671 Pain in right foot: Secondary | ICD-10-CM

## 2016-09-22 NOTE — Patient Instructions (Signed)
Seen for painful hammer toe 2nd right. Findings reviewed. Pre op paper and consent form reviewed.

## 2016-09-22 NOTE — Progress Notes (Signed)
SUBJECTIVE: 82 y.o. year old female presents with painful contracted toe 2nd right. She noted this drifting toe a year ago and is getting worse. Anything touches, cause pain.   REVIEW OF SYSTEMS: Pertinent items noted in HPI and remainder of comprehensive ROS otherwise negative.  OBJECTIVE: DERMATOLOGIC EXAMINATION: Digital corn over PIPJ 2nd toe right.  VASCULAR EXAMINATION OF LOWER LIMBS: All pedal pulses are palpable with normal pulsation.  Capillary Filling times within 3 seconds in all digits.  No edema or erythema noted. Temperature gradient from tibial crest to dorsum of foot is within normal bilateral.  NEUROLOGIC EXAMINATION OF THE LOWER LIMBS: All epicritic and tactile sensations are grossly intact.   MUSCULOSKELETAL EXAMINATION: Positive for severely contracted 2nd digit right at PIPJ with dorsal keratotic lesion.  2nd MPJ is deviated medially sits on top of the great toe right foot.  RADIOGRAPHIC STUDIES of right foot reveal:  AP View:  Rectus foot with overlapping 2nd toe on top of the right great toe. Hypertrophic medial eminence of the first metatarsal right.  Lateral view:  Contracted 2nd digit right.  ASSESSMENT: Painful hammer toe 2nd right with dorsal digital corn over PIPJ. Over lapping digits 2nd digit sits on top of the great toe right foot. Subluxed 2nd MPJ with medial deviation right foot.  PLAN: Reviewed clinical findings and available treatment options, palliation, proper shoe gear, extensotenotomy through office surgery, hammer toe surgery and capsular repair 2nd MPJ at surgery center. Patient requests hammer toe repair and repair of the subluxed 2nd MPJ right foot at surgery center. Surgery consent form reviewed including possible failure of the procedure.

## 2016-09-30 ENCOUNTER — Other Ambulatory Visit: Payer: Self-pay | Admitting: Podiatry

## 2016-09-30 MED ORDER — PROMETHAZINE HCL 12.5 MG PO TABS: 12.5000 mg | ORAL_TABLET | Freq: Three times a day (TID) | ORAL | 0 refills | Status: DC | PRN

## 2016-09-30 MED ORDER — MEPERIDINE HCL 50 MG PO TABS
50.0000 mg | ORAL_TABLET | Freq: Three times a day (TID) | ORAL | 0 refills | Status: DC | PRN
Start: 2016-09-30 — End: 2017-06-07

## 2016-10-01 ENCOUNTER — Encounter: Payer: Self-pay | Admitting: *Deleted

## 2016-10-01 DIAGNOSIS — M2040 Other hammer toe(s) (acquired), unspecified foot: Secondary | ICD-10-CM | POA: Diagnosis not present

## 2016-10-01 DIAGNOSIS — S93144A Subluxation of metatarsophalangeal joint of right lesser toe(s), initial encounter: Secondary | ICD-10-CM | POA: Diagnosis not present

## 2016-10-01 HISTORY — PX: OTHER SURGICAL HISTORY: SHX169

## 2016-10-06 ENCOUNTER — Encounter: Payer: Self-pay | Admitting: Podiatry

## 2016-10-06 ENCOUNTER — Ambulatory Visit (INDEPENDENT_AMBULATORY_CARE_PROVIDER_SITE_OTHER): Payer: Medicare HMO | Admitting: Podiatry

## 2016-10-06 DIAGNOSIS — Z9889 Other specified postprocedural states: Secondary | ICD-10-CM

## 2016-10-06 NOTE — Progress Notes (Signed)
One week post op wound, Hammer toe repair with 2nd MPJ capsulotomy (10/01/16). Patient denies any discomfort other than surgical shoe is uncomfortable to wear. Wound is clean and dry. No redness or edema noted.  Minimum bleeding at inner dressing. Reduced hammer toe deformity following the procedure. Dressing change done with Betadine soaked gauze dressing and splinted with tape.

## 2016-10-06 NOTE — Patient Instructions (Signed)
Post op wound healing well. Dressing change done. Ok to use regular open toed shoes. Return in one week.

## 2016-10-07 ENCOUNTER — Encounter: Payer: Medicare HMO | Admitting: Podiatry

## 2016-10-14 ENCOUNTER — Encounter: Payer: Self-pay | Admitting: Podiatry

## 2016-10-14 ENCOUNTER — Ambulatory Visit (INDEPENDENT_AMBULATORY_CARE_PROVIDER_SITE_OTHER): Payer: Medicare HMO | Admitting: Podiatry

## 2016-10-14 DIAGNOSIS — Z9889 Other specified postprocedural states: Secondary | ICD-10-CM

## 2016-10-14 DIAGNOSIS — M2041 Other hammer toe(s) (acquired), right foot: Secondary | ICD-10-CM

## 2016-10-14 NOTE — Progress Notes (Signed)
Two week post op wound, Hammer toe repair with 2nd MPJ capsulotomy (10/01/16). Patient denies any discomfort. Wearing her own open toed sandals.  Wound is clean and dry.  Mild erythema noted.  Minimum bleeding at inner dressing. Reduced hammer toe deformity following the procedure. Dressing change done with Betadine soaked gauze dressing and splinted with tape.

## 2016-10-14 NOTE — Patient Instructions (Signed)
2 weeks post op wound healing normal. Wound cleansed with iodine and and redressed. Keep the dressing clean and dry. Return in one week.

## 2016-10-21 ENCOUNTER — Ambulatory Visit (INDEPENDENT_AMBULATORY_CARE_PROVIDER_SITE_OTHER): Payer: Medicare HMO | Admitting: Podiatry

## 2016-10-21 DIAGNOSIS — Z9889 Other specified postprocedural states: Secondary | ICD-10-CM

## 2016-10-21 NOTE — Patient Instructions (Signed)
Post op wound healing well. Suture removed and Selofix tape placed with instruction. Return in 2 weeks.

## 2016-10-21 NOTE — Progress Notes (Signed)
Three week post op wound, Hammer toe repair with 2nd MPJ capsulotomy (10/01/16). Patient denies any pain. Wearing her own open toed sandals.   Wound is clean and dry.  Mild erythema noted.  Minimum bleeding at inner dressing. Reduced hammer toe deformity following the procedure. Dressing change done with Betadine soaked gauze dressing and splinted with tape.

## 2016-10-22 ENCOUNTER — Encounter: Payer: Self-pay | Admitting: Podiatry

## 2016-11-03 ENCOUNTER — Encounter: Payer: Self-pay | Admitting: Podiatry

## 2016-11-03 ENCOUNTER — Ambulatory Visit (INDEPENDENT_AMBULATORY_CARE_PROVIDER_SITE_OTHER): Payer: Medicare HMO | Admitting: Podiatry

## 2016-11-03 DIAGNOSIS — Z9889 Other specified postprocedural states: Secondary | ICD-10-CM

## 2016-11-03 NOTE — Patient Instructions (Signed)
Post op 2nd toe right healing well. Keep it wrapped during the day for another month. Return as needed.

## 2016-11-03 NOTE — Progress Notes (Signed)
4 weeks follow up since hammer toe repair 2nd toe right. Patient came in with Mefix tape intact. Denies any complaint. Area is clean and healed well. No complication noted. Home care instruction given to use Coban bandage during the day for another month. Sample supply dispensed. Return as needed.

## 2016-11-24 IMAGING — DX DG LUMBAR SPINE COMPLETE 4+V
5 series · 5 of 5 positions shown · non-contrast
Comparison: [REDACTED] lumbar radiographs
07/09/2005.

CLINICAL DATA: 80-year-old female with left side lumbar back pain
radiating to the left lower extremity for several weeks. Left lumbar
radiculitis. Initial encounter.

EXAM:
LUMBAR SPINE - COMPLETE 4+ VIEW

[l-spine ap]
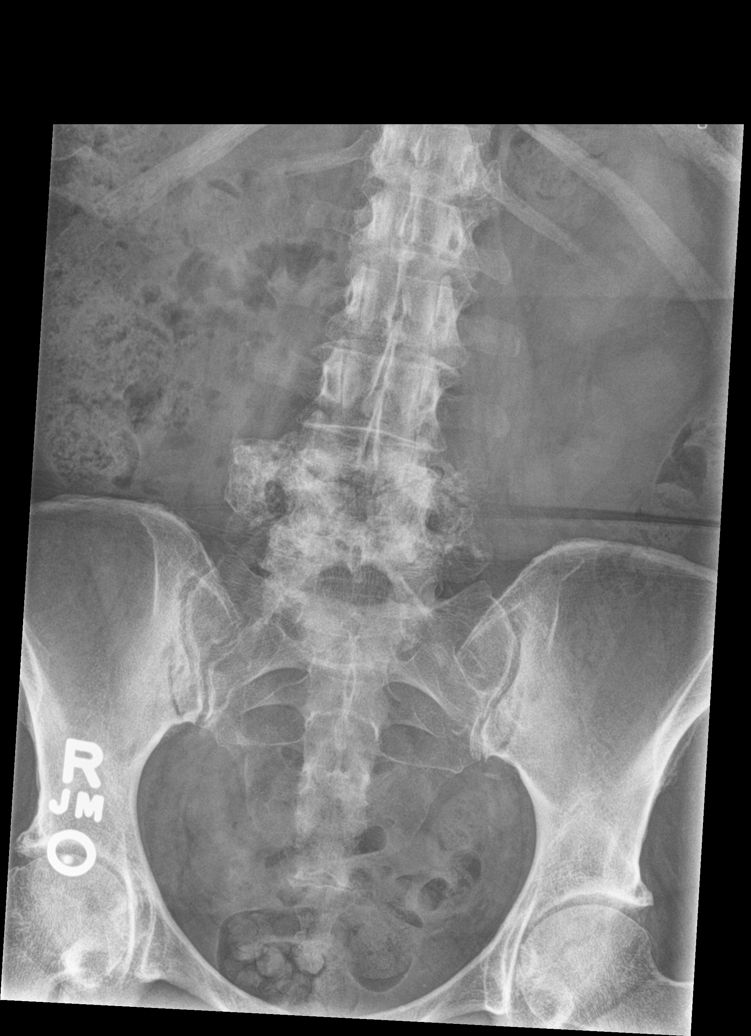

[l-spine obl (1 of 2)]
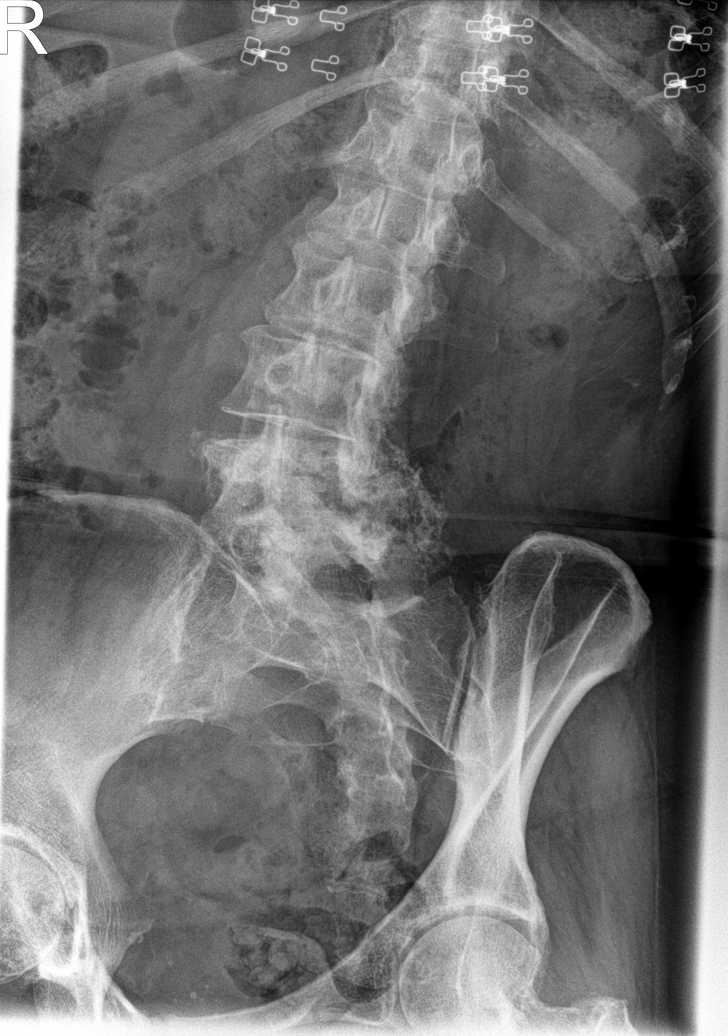

[l-spine obl (2 of 2)]
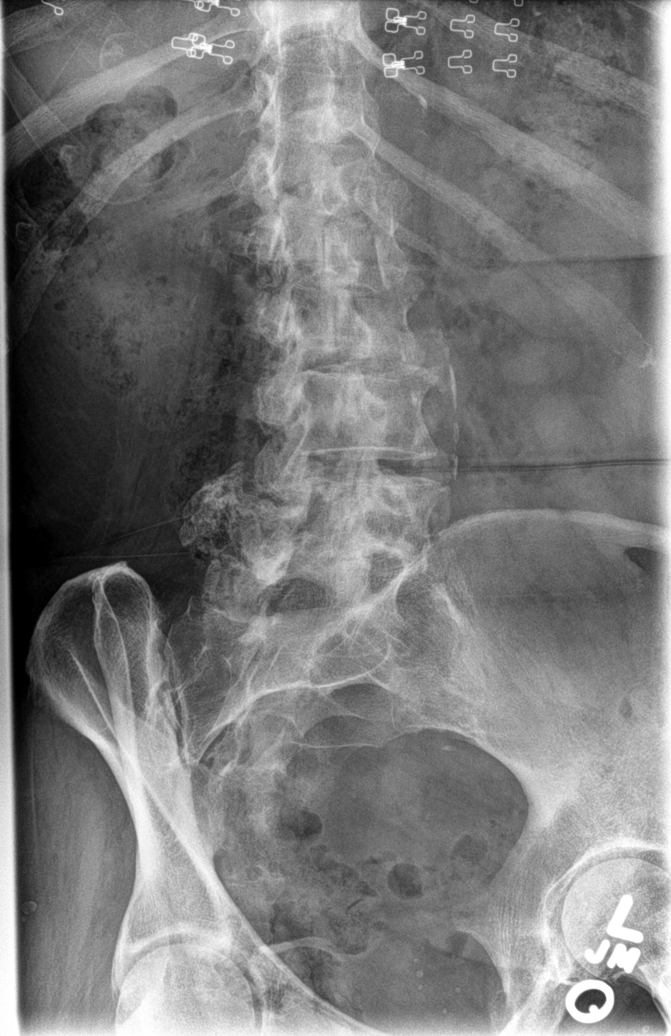

[l-spine lat]
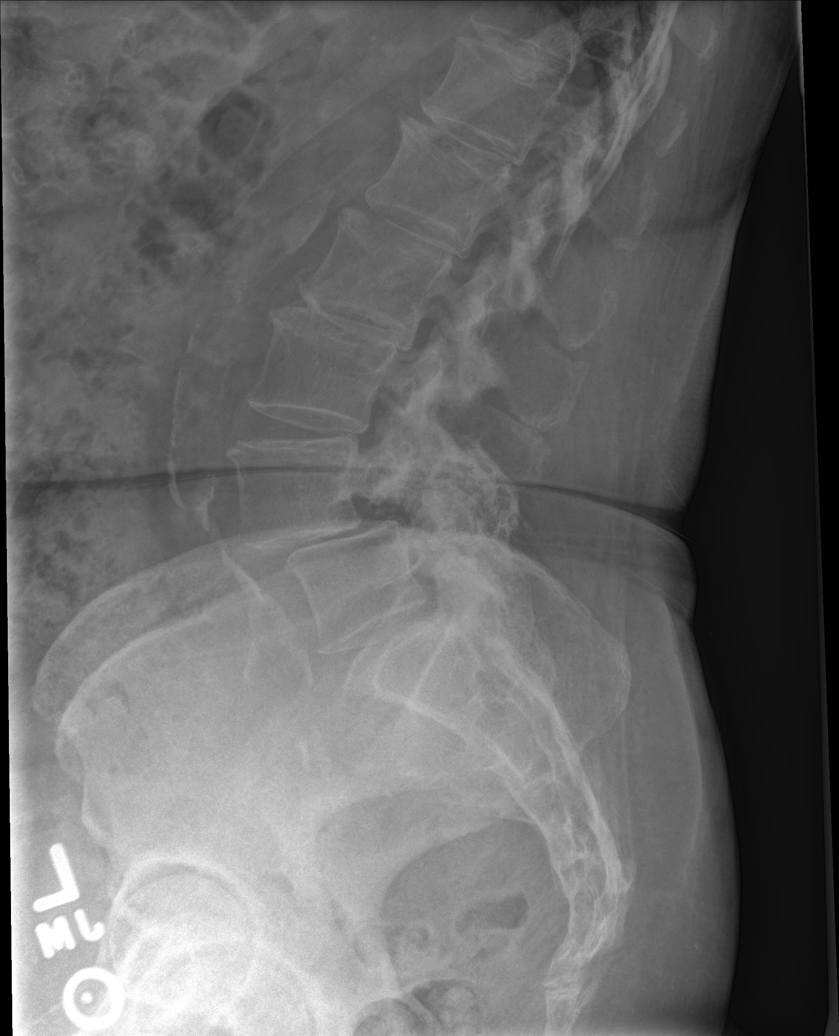

[l-spine lat l5-s1]
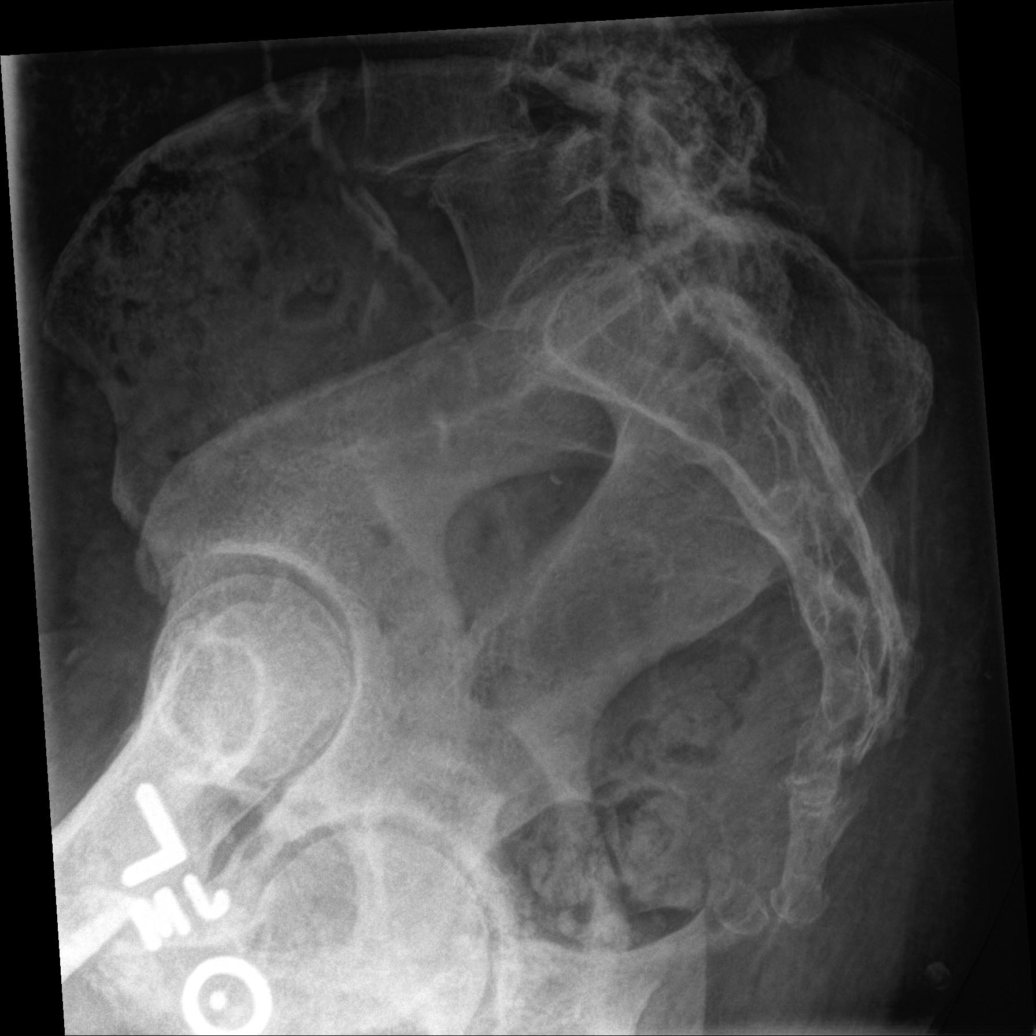

[5 of 5 positions shown; findings below may reference images not displayed]

FINDINGS: Chronic Severe L4 L5 and L5-S1 facet hypertrophy in the setting of
chronic anterolisthesis at L4-L5. Grade 2 anterolisthesis at that
level measures 12 mm, not significantly changed. Some of the
increased ossification about the L4-L5 posterior elements might be
sequelae of bone graft placement for fusion. Mild retrolisthesis
elsewhere in the lumbar spine is not significantly changed although
lumbar disc spaces appear more narrowed. There is also moderate to
severe L3-L4 facet hypertrophy. SI joints within normal limits.
Visible lower thoracic levels appear intact. Aortoiliac calcified
atherosclerosis noted.

There is a mildly displaced lower sacral fracture which appears to
be new since 1441 and ununited. This affects the S5 level.
IMPRESSION: 1. Appearance suspicious for acute to subacute S5 sacral fracture.
Query recent fall. Pelvis CT or sacrum MRI would evaluate further.
2. Chronic severe lower lumbar facet arthropathy in the setting of
chronic L4-L5 spondylolisthesis. Evidence of previous posterior
element fusion.
3.  No acute osseous abnormality in the lumbar spine.

## 2017-02-21 ENCOUNTER — Other Ambulatory Visit: Payer: Self-pay | Admitting: Sports Medicine

## 2017-02-21 DIAGNOSIS — M4316 Spondylolisthesis, lumbar region: Secondary | ICD-10-CM

## 2017-06-07 ENCOUNTER — Ambulatory Visit (INDEPENDENT_AMBULATORY_CARE_PROVIDER_SITE_OTHER): Payer: Medicare HMO | Admitting: Sports Medicine

## 2017-06-07 ENCOUNTER — Ambulatory Visit (INDEPENDENT_AMBULATORY_CARE_PROVIDER_SITE_OTHER): Payer: Medicare HMO

## 2017-06-07 DIAGNOSIS — M4316 Spondylolisthesis, lumbar region: Secondary | ICD-10-CM

## 2017-06-07 MED ORDER — DIAZEPAM 5 MG PO TABS: ORAL_TABLET | ORAL | 0 refills | Status: DC

## 2017-06-07 NOTE — Assessment & Plan Note (Signed)
There is profound facet arthritis, probably the worst I've seen at the L4-L5 level, and lesser so at L5-S1. She also had a fall, I am going to get some new x-rays, but at this point I do think we need to proceed with bilateral L4-S1 facet joint injections. I hope that the interventional radiologist can get the needle into the L4-L5 facets. Possible it will just need to be a periarticular injections. Return to see me one month afterwards. Valium for preprocedural anxiolysis.

## 2017-06-07 NOTE — Progress Notes (Signed)
  Subjective:    CC: Low back pain  HPI: Robin Malone returns, I treated her multiple times for back pain, hip pain. More recently she describes left-sided low back pain, worse with standing and walking. She had a recent fall that exacerbated this pain. It continues to be localized and axial, facetogenic without radiation or radicular symptoms, no bowel or bladder dysfunction, saddle numbness, constitutional symptoms.  Past medical history:  Negative.  See flowsheet/record as well for more information.  Surgical history: Negative.  See flowsheet/record as well for more information.  Family history: Negative.  See flowsheet/record as well for more information.  Social history: Negative.  See flowsheet/record as well for more information.  Allergies, and medications have been entered into the medical record, reviewed, and no changes needed.   Review of Systems: No fevers, chills, night sweats, weight loss, chest pain, or shortness of breath.   Objective:    General: Well Developed, well nourished, and in no acute distress.  Neuro: Alert and oriented x3, extra-ocular muscles intact, sensation grossly intact.  HEENT: Normocephalic, atraumatic, pupils equal round reactive to light, neck supple, no masses, no lymphadenopathy, thyroid nonpalpable.  Skin: Warm and dry, no rashes. Cardiac: Regular rate and rhythm, no murmurs rubs or gallops, no lower extremity edema.  Respiratory: Clear to auscultation bilaterally. Not using accessory muscles, speaking in full sentences. Back Exam:  Inspection: Unremarkable  Motion: Flexion 45 deg, Extension 45 deg, Side Bending to 45 deg bilaterally,  Rotation to 45 deg bilaterally  SLR laying: Negative  XSLR laying: Negative  Palpable tenderness: Minimal tenderness at the left lower paralumbar muscles. FABER: negative. Sensory change: Gross sensation intact to all lumbar and sacral dermatomes.  Reflexes: 2+ at both patellar tendons, 2+ at achilles tendons,  Babinski's downgoing.  Strength at foot  Plantar-flexion: 5/5 Dorsi-flexion: 5/5 Eversion: 5/5 Inversion: 5/5  Leg strength  Quad: 5/5 Hamstring: 5/5 Hip flexor: 5/5 Hip abductors: 5/5  Gait unremarkable.  I reviewed her MRI again, she has grade 2 L4 on L5 spondylolisthesis, at the L4-L5 level she has the worst facet arthritis I have ever seen, and somewhat lesser so at the L5-S1 level bilaterally.  Impression and Recommendations:    Spondylolisthesis at L4-L5 level There is profound facet arthritis, probably the worst I've seen at the L4-L5 level, and lesser so at L5-S1. She also had a fall, I am going to get some new x-rays, but at this point I do think we need to proceed with bilateral L4-S1 facet joint injections. I hope that the interventional radiologist can get the needle into the L4-L5 facets. Possible it will just need to be a periarticular injections. Return to see me one month afterwards. Valium for preprocedural anxiolysis.

## 2017-06-29 ENCOUNTER — Other Ambulatory Visit: Payer: Medicare HMO

## 2017-07-11 DIAGNOSIS — H0100A Unspecified blepharitis right eye, upper and lower eyelids: Secondary | ICD-10-CM | POA: Insufficient documentation

## 2017-07-11 DIAGNOSIS — H04123 Dry eye syndrome of bilateral lacrimal glands: Secondary | ICD-10-CM | POA: Insufficient documentation

## 2017-07-22 DIAGNOSIS — H16223 Keratoconjunctivitis sicca, not specified as Sjogren's, bilateral: Secondary | ICD-10-CM | POA: Insufficient documentation

## 2017-12-29 ENCOUNTER — Ambulatory Visit (INDEPENDENT_AMBULATORY_CARE_PROVIDER_SITE_OTHER): Payer: Medicare HMO | Admitting: Sports Medicine

## 2017-12-29 DIAGNOSIS — M4316 Spondylolisthesis, lumbar region: Secondary | ICD-10-CM | POA: Diagnosis not present

## 2017-12-29 DIAGNOSIS — M7542 Impingement syndrome of left shoulder: Secondary | ICD-10-CM | POA: Diagnosis not present

## 2017-12-29 NOTE — Progress Notes (Signed)
Subjective:    I'm seeing this patient as a consultation for: Dr. Joetta MannersHeather Malone  CC: Left shoulder pain  HPI: This is a pleasant 84 year old female, for the past couple of weeks after trying to pick up something heavy with her left arm she has had pain that she localizes over her left deltoid, moderate, persistent without radiation, worse with abduction and trying to reach overhead.  She tried oral analgesics without any improvement.  Waking her from sleep.  Lumbar spondylosis: Has not yet had her bilateral L4-S1 facet joint injections.  Would like these reordered.  I reviewed the past medical history, family history, social history, surgical history, and allergies today and no changes were needed.  Please see the problem list section below in epic for further details.  Past Medical History: No past medical history on file. Past Surgical History: Past Surgical History:  Procedure Laterality Date  . Capsulotomy MPJ Right 10/01/2016   RT #2 Toe  . Hammer Toe Repair Right 10/01/2016   RT #2 Toe   Social History: Social History   Socioeconomic History  . Marital status: Married    Spouse name: Not on file  . Number of children: Not on file  . Years of education: Not on file  . Highest education level: Not on file  Social Needs  . Financial resource strain: Not on file  . Food insecurity - worry: Not on file  . Food insecurity - inability: Not on file  . Transportation needs - medical: Not on file  . Transportation needs - non-medical: Not on file  Occupational History  . Not on file  Tobacco Use  . Smoking status: Former Smoker  Substance and Sexual Activity  . Alcohol use: Not on file  . Drug use: Not on file  . Sexual activity: Not on file  Other Topics Concern  . Not on file  Social History Narrative  . Not on file   Family History: No family history on file. Allergies: Allergies  Allergen Reactions  . Codeine Nausea And Vomiting  . Hydrocodone-Acetaminophen  Other (See Comments)  . Levofloxacin Other (See Comments)    Joint Pain   . Tramadol Other (See Comments)  . Doxycycline Rash   Medications: See med rec.  Review of Systems: No headache, visual changes, nausea, vomiting, diarrhea, constipation, dizziness, abdominal pain, skin rash, fevers, chills, night sweats, weight loss, swollen lymph nodes, body aches, joint swelling, muscle aches, chest pain, shortness of breath, mood changes, visual or auditory hallucinations.   Objective:   General: Well Developed, well nourished, and in no acute distress.  Neuro:  Extra-ocular muscles intact, able to move all 4 extremities, sensation grossly intact.  Deep tendon reflexes tested were normal. Psych: Alert and oriented, mood congruent with affect. ENT:  Ears and nose appear unremarkable.  Hearing grossly normal. Neck: Unremarkable overall appearance, trachea midline.  No visible thyroid enlargement. Eyes: Conjunctivae and lids appear unremarkable.  Pupils equal and round. Skin: Warm and dry, no rashes noted.  Cardiovascular: Pulses palpable, no extremity edema. Left shoulder: Inspection reveals no abnormalities, atrophy or asymmetry. Palpation is normal with no tenderness over AC joint or bicipital groove. ROM is full in all planes. Rotator cuff strength normal throughout. Positive Neer and Hawkin's tests, empty can. Speeds and Yergason's tests normal. No labral pathology noted with negative Obrien's, negative crank, negative clunk, and good stability. Normal scapular function observed. No painful arc and no drop arm sign. No apprehension sign  Procedure: Real-time Ultrasound Guided  Injection of  left subacromial bursa  device: GE Logiq E  Verbal informed consent obtained.  Time-out conducted.  Noted no overlying erythema, induration, or other signs of local infection.  Skin prepped in a sterile fashion.  Local anesthesia: Topical Ethyl chloride.  With sterile technique and under real time  ultrasound guidance: 1 cc Kenalog 40, 1 cc lidocaine, 1 cc bupivacaine injected easily Completed without difficulty  Pain immediately resolved suggesting accurate placement of the medication.  Advised to call if fevers/chills, erythema, induration, drainage, or persistent bleeding.  Images permanently stored and available for review in the ultrasound unit.  Impression: Technically successful ultrasound guided injection.  Impression and Recommendations:   This case required medical decision making of moderate complexity.  Impingement syndrome of left shoulder Pain over the deltoid, impingement signs and symptoms, waking her from sleep. Subacromial injection as above, she is homebound, caring for her sick husband, so I am going to set her up with home health physical therapy.  Spondylolisthesis at L4-L5 level There is profound facet arthritis, the worst of seen at L4-L5 and lesser so at L5-S1. She never actually got her L4-S1 facet joint injections, I am going to reorder the bilateral L4-S1 facet joint injections, return to see me 1 month after injections to evaluate relief. ___________________________________________ Ihor Austin. Benjamin Stain, M.D., ABFM., CAQSM. Primary Care and Sports Medicine [facility]  Adjunct Instructor of Family Medicine  [employer]

## 2017-12-29 NOTE — Assessment & Plan Note (Signed)
There is profound facet arthritis, the worst of seen at L4-L5 and lesser so at L5-S1. She never actually got her L4-S1 facet joint injections, I am going to reorder the bilateral L4-S1 facet joint injections, return to see me 1 month after injections to evaluate relief.

## 2017-12-29 NOTE — Assessment & Plan Note (Signed)
Pain over the deltoid, impingement signs and symptoms, waking her from sleep. Subacromial injection as above, she is homebound, caring for her sick husband, so I am going to set her up with home health physical therapy.

## 2018-01-09 ENCOUNTER — Ambulatory Visit
Admission: RE | Admit: 2018-01-09 | Discharge: 2018-01-09 | Disposition: A | Discharge status: Home / Self Care | Payer: Medicare HMO | Source: Ambulatory Visit | Attending: Sports Medicine | Admitting: Sports Medicine

## 2018-01-09 ENCOUNTER — Telehealth: Payer: Self-pay | Admitting: *Deleted

## 2018-01-09 ENCOUNTER — Other Ambulatory Visit: Payer: Self-pay | Admitting: Sports Medicine

## 2018-01-09 DIAGNOSIS — M4316 Spondylolisthesis, lumbar region: Secondary | ICD-10-CM

## 2018-01-09 MED ORDER — IOPAMIDOL (ISOVUE-M 200) INJECTION 41%
1.0000 mL | Freq: Once | INTRAMUSCULAR | Status: AC
  Administered 2018-01-09: 1 mL via INTRA_ARTICULAR

## 2018-01-09 MED ORDER — METHYLPREDNISOLONE ACETATE 40 MG/ML INJ SUSP (RADIOLOG
120.0000 mg | Freq: Once | INTRAMUSCULAR | Status: AC
  Administered 2018-01-09: 120 mg via INTRA_ARTICULAR

## 2018-01-09 NOTE — Telephone Encounter (Signed)
[facility] she was scheduled to do services in the patients home and the pt declined stating she would like to cancel service. Will fwd to ordering provider for f/u.Marland Kitchen.Heath GoldBarkley, Kiah Keay Lynetta, CMA

## 2018-01-09 NOTE — Telephone Encounter (Signed)
Noted, she can see me whenever.

## 2018-01-09 NOTE — Discharge Instructions (Signed)

## 2018-02-04 ENCOUNTER — Emergency Department (INDEPENDENT_AMBULATORY_CARE_PROVIDER_SITE_OTHER)
Admission: EM | Admit: 2018-02-04 | Discharge: 2018-02-04 | Disposition: A | Discharge status: Home / Self Care | Payer: Medicare HMO | Source: Home / Self Care

## 2018-02-04 ENCOUNTER — Encounter: Payer: Self-pay | Admitting: Emergency Medicine

## 2018-02-04 DIAGNOSIS — R110 Nausea: Secondary | ICD-10-CM

## 2018-02-04 DIAGNOSIS — R1013 Epigastric pain: Secondary | ICD-10-CM | POA: Diagnosis not present

## 2018-02-04 DIAGNOSIS — R531 Weakness: Secondary | ICD-10-CM | POA: Diagnosis not present

## 2018-02-04 LAB — POCT URINALYSIS DIP (MANUAL ENTRY)
GLUCOSE UA: NEGATIVE mg/dL
NITRITE UA: NEGATIVE
PH UA: 5.5 (ref 5.0–8.0)
Protein Ur, POC: NEGATIVE mg/dL
RBC UA: NEGATIVE
Spec Grav, UA: 1.01 (ref 1.010–1.025)
Urobilinogen, UA: 0.2 E.U./dL

## 2018-02-04 MED ORDER — ONDANSETRON HCL 4 MG PO TABS: 4.0000 mg | ORAL_TABLET | Freq: Three times a day (TID) | ORAL | 0 refills | Status: DC | PRN

## 2018-02-04 MED ORDER — ONDANSETRON 4 MG PO TBDP
4.0000 mg | ORAL_TABLET | Freq: Once | ORAL | Status: AC
  Administered 2018-02-04: 4 mg via ORAL

## 2018-02-04 NOTE — Discharge Instructions (Signed)
Please consider going to the Emergency Department for evaluation.  I am concerned that your symptoms maybe caused by serious illness.

## 2018-02-04 NOTE — ED Triage Notes (Signed)
Patient complaining of nausea, weak feeling, decreased appetite, upset stomach which is sore in places, no vomiting or diarrhea, nasal drainage draining in back of throat, left ear pain.

## 2018-02-06 ENCOUNTER — Telehealth: Payer: Self-pay | Admitting: *Deleted

## 2018-02-06 LAB — URINE CULTURE
MICRO NUMBER:: 90241885
SPECIMEN QUALITY:: ADEQUATE

## 2018-02-06 NOTE — Telephone Encounter (Signed)
LM with Ucx results and to call back if she has any questions or concerns.  

## 2018-02-06 NOTE — ED Provider Notes (Signed)
[facility]    CSN: [id number] Arrival date & time: 02/04/18  1102     History   Chief Complaint Chief Complaint  Patient presents with  . Nausea    HPI Robin Malone is a 84 y.o. female.   The history is provided by the patient and a relative. No language interpreter was used.  Abdominal Pain  Pain location:  Generalized Pain quality: aching   Pain radiates to:  Does not radiate Pain severity:  Moderate Onset quality:  Gradual Duration:  3 days Timing:  Constant Progression:  Worsening Chronicity:  New Context: not alcohol use, not diet changes, not medication withdrawal and not retching   Relieved by:  Nothing Worsened by:  Nothing Ineffective treatments:  None tried Associated symptoms: anorexia and nausea   Associated symptoms: no chest pain, no fever and no hematuria   Risk factors: no alcohol abuse and has not had multiple surgeries   Pt complains of being weak, nauseated and abdominal pain.  Pt reports she has had some drainage in the back of her throat.  Pt reports decreased appetitive   History reviewed. No pertinent past medical history.  Patient Active Problem List   Diagnosis Date Noted  . Impingement syndrome of left shoulder 12/29/2017  . Laceration of leg, left 06/24/2016  . Primary osteoarthritis of left hip 05/25/2016  . Toe pain, right 10/07/2015  . Left cervical radiculopathy 03/27/2015  . Spondylolisthesis at L4-L5 level 02/17/2015  . Trochanteric bursitis of left hip 02/22/2014  . Osteoarthritis of right knee 01/25/2014    Past Surgical History:  Procedure Laterality Date  . BACK SURGERY    . Capsulotomy MPJ Right 10/01/2016   RT #2 Toe  . Hammer Toe Repair Right 10/01/2016   RT #2 Toe    OB History    No data available       Home Medications    Prior to Admission medications   Medication Sig Start Date End Date Taking? Authorizing Provider  diazepam (VALIUM) 5 MG tablet Take 1 tab PO 1 hour before procedure or  imaging. 06/07/17   Monica Bectonhekkekandam, Thomas J, MD  fluticasone (FLONASE) 50 MCG/ACT nasal spray Place into the nose.    [provider]  gabapentin (NEURONTIN) 600 MG tablet TAKE 1 TABLET AT BEDTIME. 02/21/17   Monica Bectonhekkekandam, Thomas J, MD  levothyroxine (SYNTHROID, LEVOTHROID) 75 MCG tablet Take by mouth. 06/16/17   [provider]  lisinopril-hydrochlorothiazide (PRINZIDE,ZESTORETIC) 20-12.5 MG per tablet Take 1 tablet by mouth daily.    [provider]  lovastatin (MEVACOR) 40 MG tablet Take 40 mg by mouth at bedtime. Taking 2 at bedtime.    [provider]  montelukast (SINGULAIR) 10 MG tablet  02/17/15   [provider]  mupirocin ointment (BACTROBAN) 2 %  12/27/14   [provider]  ondansetron (ZOFRAN) 4 MG tablet Take 1 tablet (4 mg total) by mouth every 8 (eight) hours as needed for nausea or vomiting. 02/04/18   Elson AreasSofia, Leslie K, PA-C    Family History No family history on file.  Social History Social History   Tobacco Use  . Smoking status: Former Games developermoker  . Smokeless tobacco: Never Used  Substance Use Topics  . Alcohol use: Yes    Alcohol/week: 0.0 oz  . Drug use: Not on file     Allergies   Codeine; Hydrocodone-acetaminophen; Levofloxacin; Tramadol; and Doxycycline   Review of Systems Review of Systems  Constitutional: Negative for fever.  Cardiovascular: Negative for chest  pain.  Gastrointestinal: Positive for abdominal pain, anorexia and nausea.  Genitourinary: Negative for hematuria.  All other systems reviewed and are negative.    Physical Exam Triage Vital Signs ED Triage Vitals [02/04/18 1145]  Enc Vitals Group     BP 119/71     Pulse Rate 84     Resp      Temp 98 F (36.7 C)     Temp Source Oral     SpO2 95 %     Weight 167 lb 8 oz (76 kg)     Height 5\' 4"  (1.626 m)     Head Circumference      Peak Flow      Pain Score 0     Pain Loc      Pain Edu?      Excl. in GC?    No data found.  Updated  Vital Signs BP 119/71 (BP Location: Right Arm)   Pulse 84   Temp 98 F (36.7 C) (Oral)   Ht 5\' 4"  (1.626 m)   Wt 167 lb 8 oz (76 kg)   SpO2 95%   BMI 28.75 kg/m   Visual Acuity Right Eye Distance:   Left Eye Distance:   Bilateral Distance:    Right Eye Near:   Left Eye Near:    Bilateral Near:     Physical Exam  Constitutional: She is oriented to person, place, and time. She appears well-developed and well-nourished.  HENT:  Head: Normocephalic.  Right Ear: External ear normal.  Left Ear: External ear normal.  Mouth/Throat: Oropharynx is clear and moist.  Eyes: EOM are normal.  Neck: Normal range of motion.  Cardiovascular: Normal rate and regular rhythm.  Pulmonary/Chest: Effort normal.  Abdominal: Soft. She exhibits no distension. There is tenderness.  Abdomen tender,  Most tender upper abdomen and epigastric   Musculoskeletal: Normal range of motion.  Neurological: She is alert and oriented to person, place, and time.  Psychiatric: She has a normal mood and affect.  Nursing note and vitals reviewed.    UC Treatments / Results  Labs (all labs ordered are listed, but only abnormal results are displayed) Labs Reviewed  POCT URINALYSIS DIP (MANUAL ENTRY) - Abnormal; Notable for the following components:      Result Value   Bilirubin, UA small (*)    Ketones, POC UA trace (5) (*)    Leukocytes, UA Small (1+) (*)    All other components within normal limits  URINE CULTURE    EKG  EKG Interpretation None       Radiology No results found.  Procedures Procedures (including critical care time)  Medications Ordered in UC Medications  ondansetron (ZOFRAN-ODT) disintegrating tablet 4 mg (4 mg Oral Given 02/04/18 1238)     Initial Impression / Assessment and Plan / UC Course  I have reviewed the triage vital signs and the nursing notes.  Pertinent labs & imaging results that were available during my care of the patient were reviewed by me and considered  in my medical decision making (see chart for details).     MDM  I advised pt I think she needs evaluation at hospital for possible imaging.  I do not have ct or ultrasound available here.  I gave pt zofran odt to help with nausea.  Pt urine shows ketones,  Pts lips are cracked and she appears dehydrated.  Pt was able to drink 16 ounces of water after zofran.  I discussed going to hospital with  pt and daughter.  Pt reports she does not want to go to the hospital she just wants medication for nausea.  Pt and her daughter understand that I am do not know underlying cause of pt's weakness, pain or nausea.   I gave pt rx for zofran.  Pt discharged with advised to go to ED of choice.  Final Clinical Impressions(s) / UC Diagnoses   Final diagnoses:  Weakness  Epigastric pain  Nausea    ED Discharge Orders        Ordered    ondansetron (ZOFRAN) 4 MG tablet  Every 8 hours PRN,   Status:  Discontinued     02/04/18 1305    ondansetron (ZOFRAN) 4 MG tablet  Every 8 hours PRN     02/04/18 1341       Controlled Substance Prescriptions [place] Controlled Substance Registry consulted? Not Applicable      Elson Areas, [place] 02/06/18 1512

## 2018-02-21 ENCOUNTER — Ambulatory Visit (INDEPENDENT_AMBULATORY_CARE_PROVIDER_SITE_OTHER): Payer: Medicare HMO | Admitting: Sports Medicine

## 2018-02-21 ENCOUNTER — Encounter: Payer: Self-pay | Admitting: Sports Medicine

## 2018-02-21 DIAGNOSIS — M4316 Spondylolisthesis, lumbar region: Secondary | ICD-10-CM

## 2018-02-21 DIAGNOSIS — M7542 Impingement syndrome of left shoulder: Secondary | ICD-10-CM

## 2018-02-21 NOTE — Assessment & Plan Note (Signed)
Still having some pain after subacromial injection at the last visit but significantly better. I would like to set her up again with home health physical therapy. She has not done any yet for her shoulder.

## 2018-02-21 NOTE — Assessment & Plan Note (Addendum)
Profound facet arthritis from L4-S1, she had bilateral L4-S1 facet joint injections that provided nearly 50% relief for several hours but no long-term relief. Because of this I do think it is reasonable to proceed with medial branch blocks in anticipation of L4-S1 bilateral facet radiofrequency ablation.

## 2018-02-21 NOTE — Progress Notes (Signed)
  Subjective:    CC: Follow-up  HPI: Lumbar spondylosis: Had a good initial response for several hours to a bilateral L4-S1 facet joint injection series, unfortunately had a recurrence of pain.  Left shoulder pain: Impingement syndrome, good initial response to subacromial injection, has not done any physical therapy.  I reviewed the past medical history, family history, social history, surgical history, and allergies today and no changes were needed.  Please see the problem list section below in epic for further details.  Past Medical History: No past medical history on file. Past Surgical History: Past Surgical History:  Procedure Laterality Date  . BACK SURGERY    . Capsulotomy MPJ Right 10/01/2016   RT #2 Toe  . Hammer Toe Repair Right 10/01/2016   RT #2 Toe   Social History: Social History   Socioeconomic History  . Marital status: Married    Spouse name: None  . Number of children: None  . Years of education: None  . Highest education level: None  Social Needs  . Financial resource strain: None  . Food insecurity - worry: None  . Food insecurity - inability: None  . Transportation needs - medical: None  . Transportation needs - non-medical: None  Occupational History  . None  Tobacco Use  . Smoking status: Former Games developermoker  . Smokeless tobacco: Never Used  Substance and Sexual Activity  . Alcohol use: Yes    Alcohol/week: 0.0 oz  . Drug use: None  . Sexual activity: None  Other Topics Concern  . None  Social History Narrative  . None   Family History: No family history on file. Allergies: Allergies  Allergen Reactions  . Codeine Nausea And Vomiting  . Hydrocodone-Acetaminophen Other (See Comments)  . Levofloxacin Other (See Comments)    Joint Pain   . Tramadol Other (See Comments)  . Doxycycline Rash   Medications: See med rec.  Review of Systems: No fevers, chills, night sweats, weight loss, chest pain, or shortness of breath.   Objective:     General: Well Developed, well nourished, and in no acute distress.  Neuro: Alert and oriented x3, extra-ocular muscles intact, sensation grossly intact.  HEENT: Normocephalic, atraumatic, pupils equal round reactive to light, neck supple, no masses, no lymphadenopathy, thyroid nonpalpable.  Skin: Warm and dry, no rashes. Cardiac: Regular rate and rhythm, no murmurs rubs or gallops, no lower extremity edema.  Respiratory: Clear to auscultation bilaterally. Not using accessory muscles, speaking in full sentences.  Impression and Recommendations:    Impingement syndrome of left shoulder Still having some pain after subacromial injection at the last visit but significantly better. I would like to set her up again with home health physical therapy. She has not done any yet for her shoulder.  Spondylolisthesis at L4-L5 level Profound facet arthritis from L4-S1, she had bilateral L4-S1 facet joint injections that provided nearly 50% relief for several hours but no long-term relief. Because of this I do think it is reasonable to proceed with medial branch blocks in anticipation of L4-S1 bilateral facet radiofrequency ablation.  I spent 25 minutes with this patient, greater than 50% was face-to-face time counseling regarding the above diagnoses ___________________________________________ Ihor Austinhomas J. Benjamin Stainhekkekandam, M.D., ABFM., CAQSM. Primary Care and Sports Medicine [place] [facility]  Adjunct Instructor of Family Medicine  [employer]

## 2018-04-03 ENCOUNTER — Ambulatory Visit (INDEPENDENT_AMBULATORY_CARE_PROVIDER_SITE_OTHER): Payer: Medicare HMO | Admitting: Sports Medicine

## 2018-04-03 DIAGNOSIS — M7542 Impingement syndrome of left shoulder: Secondary | ICD-10-CM

## 2018-04-03 DIAGNOSIS — M4316 Spondylolisthesis, lumbar region: Secondary | ICD-10-CM | POA: Diagnosis not present

## 2018-04-03 NOTE — Assessment & Plan Note (Addendum)
Not going to do any physical therapy. Previous subacromial injection was in December, she does not have any impingement symptoms but more bicipital groove symptoms, positive speeds test. Bicipital tendon sheath injection done today, complete resolution of pain.

## 2018-04-03 NOTE — Assessment & Plan Note (Signed)
Profound facet arthritis from L4-S1, bilateral L4-S1 facet injections provided 50% pain relief for several hours but no long-term relief. She you [facility] medial branch blocks and radiofrequency ablation of bilateral L4-S1 facets, we are going to try them again.

## 2018-04-03 NOTE — Progress Notes (Signed)
Subjective:    CC: Follow-up  HPI: Shoulder pain: We did a subacromial injection sometime ago, she had a good response, she really did not do much in terms of formal physical therapy, declines to do any further episodes.  Her pain is now localized of the anterior left shoulder, worse with flexion of the shoulder.  Desires repeat interventional treatment today.  Lumbar spondylosis: Was never contacted by [facility] imaging regarding proceeding to facet radio frequency ablation and medial branch blocks.  I reviewed the past medical history, family history, social history, surgical history, and allergies today and no changes were needed.  Please see the problem list section below in epic for further details.  Past Medical History: No past medical history on file. Past Surgical History: Past Surgical History:  Procedure Laterality Date  . BACK SURGERY    . Capsulotomy MPJ Right 10/01/2016   RT #2 Toe  . Hammer Toe Repair Right 10/01/2016   RT #2 Toe   Social History: Social History   Socioeconomic History  . Marital status: Married    Spouse name: Not on file  . Number of children: Not on file  . Years of education: Not on file  . Highest education level: Not on file  Occupational History  . Not on file  Social Needs  . Financial resource strain: Not on file  . Food insecurity:    Worry: Not on file    Inability: Not on file  . Transportation needs:    Medical: Not on file    Non-medical: Not on file  Tobacco Use  . Smoking status: Former Games developer  . Smokeless tobacco: Never Used  Substance and Sexual Activity  . Alcohol use: Yes    Alcohol/week: 0.0 oz  . Drug use: Not on file  . Sexual activity: Not on file  Lifestyle  . Physical activity:    Days per week: Not on file    Minutes per session: Not on file  . Stress: Not on file  Relationships  . Social connections:    Talks on phone: Not on file    Gets together: Not on file    Attends religious service: Not on  file    Active member of club or organization: Not on file    Attends meetings of clubs or organizations: Not on file    Relationship status: Not on file  Other Topics Concern  . Not on file  Social History Narrative  . Not on file   Family History: No family history on file. Allergies: Allergies  Allergen Reactions  . Codeine Nausea And Vomiting  . Hydrocodone-Acetaminophen Other (See Comments)  . Levofloxacin Other (See Comments)    Joint Pain   . Tramadol Other (See Comments)  . Doxycycline Rash   Medications: See med rec.  Review of Systems: No fevers, chills, night sweats, weight loss, chest pain, or shortness of breath.   Objective:    General: Well Developed, well nourished, and in no acute distress.  Neuro: Alert and oriented x3, extra-ocular muscles intact, sensation grossly intact.  HEENT: Normocephalic, atraumatic, pupils equal round reactive to light, neck supple, no masses, no lymphadenopathy, thyroid nonpalpable.  Skin: Warm and dry, no rashes. Cardiac: Regular rate and rhythm, no murmurs rubs or gallops, no lower extremity edema.  Respiratory: Clear to auscultation bilaterally. Not using accessory muscles, speaking in full sentences. Left shoulder: Inspection reveals no abnormalities, atrophy or asymmetry. Tender to palpation over the bicipital groove ROM is full in all planes.  Rotator cuff strength normal throughout. No signs of impingement with negative Neer and Hawkin's tests, empty can. Positive speeds test No labral pathology noted with negative Obrien's, negative crank, negative clunk, and good stability. Normal scapular function observed. No painful arc and no drop arm sign. No apprehension sign  Procedure: Real-time Ultrasound Guided Injection of left biceps sheath Device: GE Logiq E  Verbal informed consent obtained.  Time-out conducted.  Noted no overlying erythema, induration, or other signs of local infection.  Skin prepped in a sterile  fashion.  Local anesthesia: Topical Ethyl chloride.  With sterile technique and under real time ultrasound guidance: Using a 25-gauge needle advanced into the biceps tendon sheath and injected 1 cc kenalog 40, 1 cc lidocaine, 1 cc bupivacaine. Completed without difficulty  Pain immediately resolved suggesting accurate placement of the medication.  Advised to call if fevers/chills, erythema, induration, drainage, or persistent bleeding.  Images permanently stored and available for review in the ultrasound unit.  Impression: Technically successful ultrasound guided injection.  Impression and Recommendations:    Impingement syndrome of left shoulder Not going to do any physical therapy. Previous subacromial injection was in December, she does not have any impingement symptoms but more bicipital groove symptoms, positive speeds test. Bicipital tendon sheath injection done today, complete resolution of pain.  Spondylolisthesis at L4-L5 level Profound facet arthritis from L4-S1, bilateral L4-S1 facet injections provided 50% pain relief for several hours but no long-term relief. She you [facility] to discuss medial branch blocks and radiofrequency ablation of bilateral L4-S1 facets, we are going to try them again. ___________________________________________ Ihor Austinhomas J. Benjamin Stainhekkekandam, M.D., ABFM., CAQSM. Primary Care and Sports Medicine [place] [facility]  Adjunct Instructor of Family Medicine  [employer]

## 2018-04-10 ENCOUNTER — Other Ambulatory Visit: Payer: Self-pay | Admitting: Sports Medicine

## 2018-04-10 DIAGNOSIS — M4316 Spondylolisthesis, lumbar region: Secondary | ICD-10-CM

## 2018-05-01 ENCOUNTER — Other Ambulatory Visit: Payer: Medicare HMO

## 2018-05-01 ENCOUNTER — Inpatient Hospital Stay: Admission: RE | Admit: 2018-05-01 | Payer: Medicare HMO | Source: Ambulatory Visit

## 2019-05-16 ENCOUNTER — Ambulatory Visit (INDEPENDENT_AMBULATORY_CARE_PROVIDER_SITE_OTHER): Payer: Medicare HMO | Admitting: Sports Medicine

## 2019-05-16 ENCOUNTER — Encounter: Payer: Self-pay | Admitting: Sports Medicine

## 2019-05-16 DIAGNOSIS — M7061 Trochanteric bursitis, right hip: Secondary | ICD-10-CM | POA: Diagnosis not present

## 2019-05-16 NOTE — Progress Notes (Signed)
Subjective:    CC: Right hip pain  HPI: This is a very pleasant 85 year old female, recently she was trying to move a barstool, when pulling it she felt a sharp pain in her right hip, since then she is had difficulty laying on the ipsilateral side, pain is severe, localized over the greater trochanter without radiation.  I reviewed the past medical history, family history, social history, surgical history, and allergies today and no changes were needed.  Please see the problem list section below in epic for further details.  Past Medical History: No past medical history on file. Past Surgical History: Past Surgical History:  Procedure Laterality Date  . BACK SURGERY    . Capsulotomy MPJ Right 10/01/2016   RT #2 Toe  . Hammer Toe Repair Right 10/01/2016   RT #2 Toe   Social History: Social History   Socioeconomic History  . Marital status: Married    Spouse name: Not on file  . Number of children: Not on file  . Years of education: Not on file  . Highest education level: Not on file  Occupational History  . Not on file  Social Needs  . Financial resource strain: Not on file  . Food insecurity:    Worry: Not on file    Inability: Not on file  . Transportation needs:    Medical: Not on file    Non-medical: Not on file  Tobacco Use  . Smoking status: Former Games developermoker  . Smokeless tobacco: Never Used  Substance and Sexual Activity  . Alcohol use: Yes    Alcohol/week: 0.0 standard drinks  . Drug use: Not on file  . Sexual activity: Not on file  Lifestyle  . Physical activity:    Days per week: Not on file    Minutes per session: Not on file  . Stress: Not on file  Relationships  . Social connections:    Talks on phone: Not on file    Gets together: Not on file    Attends religious service: Not on file    Active member of club or organization: Not on file    Attends meetings of clubs or organizations: Not on file    Relationship status: Not on file  Other Topics  Concern  . Not on file  Social History Narrative  . Not on file   Family History: No family history on file. Allergies: Allergies  Allergen Reactions  . Codeine Nausea And Vomiting  . Hydrocodone-Acetaminophen Other (See Comments)  . Levofloxacin Other (See Comments)    Joint Pain   . Tramadol Other (See Comments)  . Doxycycline Rash   Medications: See med rec.  Review of Systems: No fevers, chills, night sweats, weight loss, chest pain, or shortness of breath.   Objective:    General: Well Developed, well nourished, and in no acute distress.  Neuro: Alert and oriented x3, extra-ocular muscles intact, sensation grossly intact.  HEENT: Normocephalic, atraumatic, pupils equal round reactive to light, neck supple, no masses, no lymphadenopathy, thyroid nonpalpable.  Skin: Warm and dry, no rashes. Cardiac: Regular rate and rhythm, no murmurs rubs or gallops, no lower extremity edema.  Respiratory: Clear to auscultation bilaterally. Not using accessory muscles, speaking in full sentences. Right hip: ROM IR: 60 Deg, ER: 60 Deg, Flexion: 120 Deg, Extension: 100 Deg, Abduction: 45 Deg, Adduction: 45 Deg Strength IR: 5/5, ER: 5/5, Flexion: 5/5, Extension: 5/5, Abduction: 5/5, Adduction: 5/5 Pelvic alignment unremarkable to inspection and palpation. Standing hip rotation and gait  without trendelenburg / unsteadiness. Greater trochanter with severe tenderness to palpation. No tenderness over piriformis. No SI joint tenderness and normal minimal SI movement.  Procedure: Real-time Ultrasound Guided injection of the right greater trochanteric bursa Device: GE Logiq E  Verbal informed consent obtained.  Time-out conducted.  Noted no overlying erythema, induration, or other signs of local infection.  Skin prepped in a sterile fashion.  Local anesthesia: Topical Ethyl chloride.  With sterile technique and under real time ultrasound guidance:  1 cc Kenalog 40, 2 cc lidocaine, 2 cc  bupivacaine injected easily Completed without difficulty  Pain immediately resolved suggesting accurate placement of the medication.  Advised to call if fevers/chills, erythema, induration, drainage, or persistent bleeding.  Images permanently stored and available for review in the ultrasound unit.  Impression: Technically successful ultrasound guided injection.  Impression and Recommendations:    Greater trochanteric bursitis of right hip Occurred after trying to move a piece of furniture. Injection today per patient request, rehab exercises given. Return to see me as needed in 1 month.   ___________________________________________ Ihor Austin. Benjamin Stain, M.D., ABFM., CAQSM. Primary Care and Sports Medicine [place] [facility]  Adjunct Professor of Family Medicine  [employer]

## 2019-05-16 NOTE — Assessment & Plan Note (Signed)
Occurred after trying to move a piece of furniture. Injection today per patient request, rehab exercises given. Return to see me as needed in 1 month.

## 2019-06-13 ENCOUNTER — Ambulatory Visit: Payer: Medicare HMO | Admitting: Sports Medicine

## 2020-03-12 ENCOUNTER — Ambulatory Visit (INDEPENDENT_AMBULATORY_CARE_PROVIDER_SITE_OTHER): Payer: Medicare HMO | Admitting: Sports Medicine

## 2020-03-12 ENCOUNTER — Other Ambulatory Visit: Payer: Self-pay

## 2020-03-12 ENCOUNTER — Encounter: Payer: Self-pay | Admitting: Sports Medicine

## 2020-03-12 DIAGNOSIS — M7061 Trochanteric bursitis, right hip: Secondary | ICD-10-CM | POA: Diagnosis not present

## 2020-03-12 MED ORDER — NAPROXEN 500 MG PO TABS: 500.0000 mg | ORAL_TABLET | Freq: Two times a day (BID) | ORAL | 3 refills | Status: AC

## 2020-03-12 NOTE — Progress Notes (Signed)
    Procedures performed today:    None.  Independent interpretation of notes and tests performed by another provider:   None.  Brief History, Exam, Impression, and Recommendations:    Greater trochanteric bursitis of right hip ProximalThis is a very pleasant 86 year old female with known greater trochanteric bursitis. This was last injected in June 2020 after moving a heavy piece of furniture. More recently she has been doing a lot of mopping around the house and is having recurrence of pain. She did have her second COVID-19 vaccine 1 week ago, so understands that we cannot do her trochanteric bursa injection today, I am going to give her prescription strength, and I had like to see her back in just over a week at which point we will be 2 weeks out from her COVID-19 vaccine and can do the steroid injection.    ___________________________________________ Ihor Austin. Benjamin Stain, M.D., ABFM., CAQSM. Primary Care and Sports Medicine [place] [facility]  Adjunct Instructor of Family Medicine  [employer]

## 2020-03-12 NOTE — Assessment & Plan Note (Signed)
ProximalThis is a very pleasant 86 year old female with known greater trochanteric bursitis. This was last injected in June 2020 after moving a heavy piece of furniture. More recently she has been doing a lot of mopping around the house and is having recurrence of pain. She did have her second COVID-19 vaccine 1 week ago, so understands that we cannot do her trochanteric bursa injection today, I am going to give her prescription strength, and I had like to see her back in just over a week at which point we will be 2 weeks out from her COVID-19 vaccine and can do the steroid injection.

## 2020-03-21 ENCOUNTER — Ambulatory Visit (INDEPENDENT_AMBULATORY_CARE_PROVIDER_SITE_OTHER): Payer: Medicare HMO | Admitting: Sports Medicine

## 2020-03-21 ENCOUNTER — Other Ambulatory Visit: Payer: Self-pay

## 2020-03-21 ENCOUNTER — Encounter: Payer: Self-pay | Admitting: Sports Medicine

## 2020-03-21 DIAGNOSIS — M7061 Trochanteric bursitis, right hip: Secondary | ICD-10-CM | POA: Diagnosis not present

## 2020-03-21 NOTE — Assessment & Plan Note (Addendum)
This is a pleasant 86 year old female, she has known greater trochanteric bursitis on the right side, at the last visit we were unable to inject her as she had just had her COVID-19 vaccine, this all occurred after lifting a heavy piece of furniture. Today she has persistence of pain, we did an injection today, return as needed.

## 2020-03-21 NOTE — Progress Notes (Signed)
    Procedures performed today:    Procedure: Real-time Ultrasound Guided injection of the right greater trochanteric bursa Return to see me as needed for this. Device: Samsung HS60  Verbal informed consent obtained.  Time-out conducted.  Noted no overlying erythema, induration, or other signs of local infection.  Skin prepped in a sterile fashion.  Local anesthesia: Topical Ethyl chloride.  With sterile technique and under real time ultrasound guidance: 1 cc Kenalog 40, 2 cc lidocaine, 2 cc bupivacaine injected easily in her greater trochanteric bursa, I also placed medication superficial and deep to the hip abductor tendons. Completed without difficulty  Pain immediately resolved suggesting accurate placement of the medication.  Advised to call if fevers/chills, erythema, induration, drainage, or persistent bleeding.  Images permanently stored and available for review in the ultrasound unit.  Impression: Technically successful ultrasound guided injection.  Independent interpretation of notes and tests performed by another provider:   None.  Brief History, Exam, Impression, and Recommendations:    Greater trochanteric bursitis of right hip This is a pleasant 86 year old female, she has known greater trochanteric bursitis on the right side, at the last visit we were unable to inject her as she had just had her COVID-19 vaccine, this all occurred after lifting a heavy piece of furniture. Today she has persistence of pain, we did an injection today, return as needed.    ___________________________________________ Ihor Austin. Benjamin Stain, M.D., ABFM., CAQSM. Primary Care and Sports Medicine [facility]  Adjunct Instructor of Family Medicine  [employer]

## 2020-07-21 ENCOUNTER — Other Ambulatory Visit: Payer: Self-pay

## 2020-07-21 ENCOUNTER — Ambulatory Visit (INDEPENDENT_AMBULATORY_CARE_PROVIDER_SITE_OTHER): Payer: Medicare HMO | Admitting: Sports Medicine

## 2020-07-21 DIAGNOSIS — M7061 Trochanteric bursitis, right hip: Secondary | ICD-10-CM | POA: Diagnosis not present

## 2020-07-21 NOTE — Progress Notes (Signed)
° ° °  Procedures performed today:    Procedure: Real-time Ultrasound Guided injection of the right greater trochanteric bursa Device: Samsung HS60  Verbal informed consent obtained.  Time-out conducted.  Noted no overlying erythema, induration, or other signs of local infection.  Skin prepped in a sterile fashion.  Local anesthesia: Topical Ethyl chloride.  With sterile technique and under real time ultrasound guidance: 1 cc Kenalog 40, 2 cc lidocaine, 2 cc bupivacaine injected easily Completed without difficulty  Pain immediately resolved suggesting accurate placement of the medication.  Advised to call if fevers/chills, erythema, induration, drainage, or persistent bleeding.  Images permanently stored and available for review in the ultrasound unit.  Impression: Technically successful ultrasound guided injection.  Independent interpretation of notes and tests performed by another provider:   None.  Brief History, Exam, Impression, and Recommendations:    Greater trochanteric bursitis of right hip Robin Malone returns, she is a pleasant 86 year old female, she has known greater trochanteric bursitis on the right side, we last injected her in April, she is having a recurrence of pain. Injected again today, return to see me as needed. Of note she is doing her husband's funeral tomorrow.    ___________________________________________ Ihor Austin. Benjamin Stain, M.D., ABFM., CAQSM. Primary Care and Sports Medicine [facility]  Adjunct Instructor of Family Medicine

## 2020-07-21 NOTE — Assessment & Plan Note (Signed)
Robin Malone returns, she is a pleasant 86 year old female, she has known greater trochanteric bursitis on the right side, we last injected her in April, she is having a recurrence of pain. Injected again today, return to see me as needed. Of note she is doing her husband's funeral tomorrow.

## 2020-12-30 DIAGNOSIS — M545 Low back pain, unspecified: Secondary | ICD-10-CM | POA: Insufficient documentation

## 2021-02-16 DIAGNOSIS — M47816 Spondylosis without myelopathy or radiculopathy, lumbar region: Secondary | ICD-10-CM | POA: Insufficient documentation

## 2021-02-16 DIAGNOSIS — M4316 Spondylolisthesis, lumbar region: Secondary | ICD-10-CM | POA: Insufficient documentation

## 2021-02-16 DIAGNOSIS — M51369 Other intervertebral disc degeneration, lumbar region without mention of lumbar back pain or lower extremity pain: Secondary | ICD-10-CM | POA: Insufficient documentation

## 2021-07-08 ENCOUNTER — Other Ambulatory Visit: Payer: Self-pay

## 2021-07-08 ENCOUNTER — Ambulatory Visit (INDEPENDENT_AMBULATORY_CARE_PROVIDER_SITE_OTHER): Payer: Medicare HMO | Admitting: Rehabilitative and Restorative Service Providers"

## 2021-07-08 ENCOUNTER — Encounter: Payer: Self-pay | Admitting: Rehabilitative and Restorative Service Providers"

## 2021-07-08 DIAGNOSIS — M542 Cervicalgia: Secondary | ICD-10-CM

## 2021-07-08 DIAGNOSIS — M6281 Muscle weakness (generalized): Secondary | ICD-10-CM | POA: Diagnosis not present

## 2021-07-08 DIAGNOSIS — M545 Low back pain, unspecified: Secondary | ICD-10-CM | POA: Diagnosis not present

## 2021-07-08 DIAGNOSIS — R2689 Other abnormalities of gait and mobility: Secondary | ICD-10-CM

## 2021-07-08 DIAGNOSIS — G8929 Other chronic pain: Secondary | ICD-10-CM

## 2021-07-08 NOTE — Therapy (Signed)
[facility] [place] [address] [place], [place], [address] Phone: [phone]   Fax:  [phone]  Physical Therapy Evaluation  Patient Details  Name: Robin Malone MRN: [id number] Date of Birth: 1934/02/22 Referring Provider (PT): Joetta Manners, MD   Encounter Date: 07/08/2021   PT End of Session - 07/08/21 1216     Visit Number 1    Number of Visits 12    Date for PT Re-Evaluation 08/19/21    Authorization Type humana- requested 12 visits    Authorization - Visit Number 1    Authorization - Number of Visits 12    Progress Note Due on Visit 10    PT Start Time 1115    PT Stop Time 1153    PT Time Calculation (min) 38 min    Activity Tolerance Patient tolerated treatment well    Behavior During Therapy [facility] for tasks assessed/performed             History reviewed. No pertinent past medical history.  Past Surgical History:  Procedure Laterality Date   BACK SURGERY     Capsulotomy MPJ Right 10/01/2016   RT #2 Toe   Hammer Toe Repair Right 10/01/2016   RT #2 Toe    There were no vitals filed for this visit.    Subjective Assessment - 07/08/21 1116     Subjective The patient reports increasing muscle weakness noticing difficulty getting up from the ground (if she drops something under the couch).  She walks without a device in the house, she does hae a rollater with a seat for longer walks.  She also reports recent onset of neck pain and chronic low back pain.    Pertinent History Neck surgery in the 90s, back surgery inthe 90s, hypothyroidism    Patient Stated Goals help strengthen my legs, reduce pain in back and neck    Currently in Pain? Yes    Pain Score 8     Pain Location Neck    Pain Orientation Lower    Pain Descriptors / Indicators Aching;Sore    Pain Type Chronic pain;Acute pain    Aggravating Factors  has chronic low back pain, recently woke withneck pain    Pain Relieving Factors ibuprofen                 [facility] PT Assessment - 07/08/21 1121       Assessment   Medical Diagnosis muscle weakness    Referring Provider (PT) Joetta Manners, MD    Onset Date/Surgical Date 07/02/21    Hand Dominance Left      Precautions   Precautions Fall      Restrictions   Weight Bearing Restrictions No      Balance Screen   Has the patient fallen in the past 6 months No    Has the patient had a decrease in activity level because of a fear of falling?  No    Is the patient reluctant to leave their home because of a fear of falling?  No      Home Environment   Living Environment Private residence    Living Arrangements Alone    Type of Home House    Home Access Stairs to enter    Entrance Stairs-Number of Steps 4    Entrance Stairs-Rails Right    Home Layout One level    Home Equipment Walker - 4 wheels    Additional Comments has been through pain management in the past  Prior Function   Level of Independence Independent      Observation/Other Assessments   Focus on Therapeutic Outcomes (FOTO)  n/a due to nature of referrals      Sensation   Light Touch Appears Intact      ROM / Strength   AROM / PROM / Strength AROM;Strength      AROM   Overall AROM  Deficits    Overall AROM Comments R UE has functional ROM, L UE is limited to shoulder flexion to 60 degrees-- reports prior L shoulder injury    AROM Assessment Site Cervical   in supine, tight with bilateral sidebending and rotation     Strength   Overall Strength Comments L shoulder-- unable to lift through available ROM against gravity.    Strength Assessment Site Hip;Knee;Ankle    Right/Left Hip Right;Left    Right Hip Flexion 3/5    Left Hip Flexion 3/5    Right/Left Knee Right;Left    Right Knee Flexion 5/5    Right Knee Extension 5/5    Left Knee Flexion 5/5    Left Knee Extension 5/5    Right/Left Ankle Right;Left    Right Ankle Dorsiflexion 4/5    Left Ankle Dorsiflexion 4/5      Palpation    Palpation comment pain with upper trap palpation and thoracic paraspinals (upper t-spine)      Transfers   Transfers Sit to Stand;Stand to Sit    Sit to Stand 7: Independent    Stand to Sit 7: Independent      Ambulation/Gait   Ambulation/Gait Yes    Ambulation/Gait Assistance 6: Modified independent (Device/Increase time)   slowed speed   Ambulation Distance (Feet) 100 Feet    Assistive device None    Gait Pattern Narrow base of support;Lateral hip instability;Trendelenburg;Step-through pattern;Decreased stride length    Ambulation Surface Level;Indoor    Gait velocity TBA      Standardized Balance Assessment   Standardized Balance Assessment Berg Balance Test      Berg Balance Test   Sit to Stand Able to stand without using hands and stabilize independently    Standing Unsupported Able to stand safely 2 minutes    Sitting with Back Unsupported but Feet Supported on Floor or Stool Able to sit safely and securely 2 minutes    Stand to Sit Sits safely with minimal use of hands    Transfers Able to transfer safely, minor use of hands    Standing Unsupported with Eyes Closed Able to stand 10 seconds safely    Standing Unsupported with Feet Together Able to place feet together independently and stand 1 minute safely    From Standing, Reach Forward with Outstretched Arm Can reach forward >12 cm safely (5")    From Standing Position, Pick up Object from Floor Able to pick up shoe safely and easily    From Standing Position, Turn to Look Behind Over each Shoulder Turn sideways only but maintains balance    Turn 360 Degrees Able to turn 360 degrees safely but slowly    Standing Unsupported, Alternately Place Feet on Step/Stool Able to complete 4 steps without aid or supervision    Standing Unsupported, One Foot in Front Able to plae foot ahead of the other independently and hold 30 seconds    Standing on One Leg Tries to lift leg/unable to hold 3 seconds but remains standing independently     Total Score 45    Berg comment: 45/56  Objective measurements completed on examination: See above findings.       [facility] Adult PT Treatment/Exercise - 07/08/21 1121       Exercises   Exercises Neck;Lumbar      Neck Exercises: Seated   Shoulder Shrugs --   painful in L shoulder   Shoulder Rolls Limitations painful in L shoulder    Other Seated Exercise seated L for scap retraction x 10 reps      Neck Exercises: Supine   Shoulder ABduction Both;10 reps   to T position     Neck Exercises: Sidelying   Other Sidelying Exercise open book for thoracic mobility x 5 reps R And L sides      Lumbar Exercises: Seated   Sit to Stand 5 reps   without UE support     Lumbar Exercises: Supine   Bent Knee Raise 10 reps                    PT Education - 07/08/21 1154     Education Details HEP    Person(s) Educated Patient    Methods Explanation;Demonstration;Handout    Comprehension Verbalized understanding;Returned demonstration                 PT Long Term Goals - 07/08/21 1433       PT LONG TERM GOAL #1   Title The patient will be indep with HEP.    Time 6    Period Weeks    Target Date 08/19/21      PT LONG TERM GOAL #2   Title The patient will report neck pain < or equal to 2/10.    Baseline 8/10 today-- responds well to initial HEP    Time 6    Period Weeks    Target Date 08/19/21      PT LONG TERM GOAL #3   Title The patient will demonstrate moving floor<>stand using chair/couch/firm surface for UE support mod indep.    Time 6    Period Weeks    Target Date 08/19/21      PT LONG TERM GOAL #4   Title The patient will improve Berg balance score from 45/56 to > or equal to 50/56 demonstrating dec'd risk for falls.    Time 6    Period Weeks    Target Date 08/19/21      PT LONG TERM GOAL #5   Title The patient will be further assessed on gait speed and demo improvement of 0.5 ft/sec.    Time 6    Period  Weeks    Target Date 08/19/21                    Plan - 07/08/21 1439     Clinical Impression Statement The patient is an 87 year old female presenting to OP physical therapy withe referral for muscle weakness with h/o LBP and recent onset of neck pain.  She presents with weakness in bilateral hips noted with MMT and per trendelenberg gait deviations, dec'd ROM c-spine, pain with prolonged standing lumbar spine, tenderness to neck palpation, and dec'd balance.  PT to address deficits to optimize functional status.    Personal Factors and Comorbidities Comorbidity 2    Comorbidities arthritis, spondylolisthesis    Examination-Activity Limitations Locomotion Level;Lift;Stand;Squat;Stairs    Examination-Participation Restrictions Community Activity;Driving;Cleaning    Stability/Clinical Decision Making Evolving/Moderate complexity    Clinical Decision Making Moderate    Rehab Potential Good    PT  Frequency 2x / week    PT Duration 6 weeks    PT Treatment/Interventions Gait training;Stair training;ADLs/Self Care Home Management;Patient/family education;Therapeutic activities;Therapeutic exercise;Balance training;Neuromuscular re-education;Manual techniques;Dry needling;Taping;Aquatic Therapy;Electrical Stimulation;Moist Heat    PT Next Visit Plan Progress HEP; initially focus on c-spine due to severity of pain today, work on Financial controller, LE strengthening, balance and gait *hip abductors weak -- strengthen to improve gait    PT Home Exercise Plan Access Code: [email or web address]    Consulted and Agree with Plan of Care Patient             Patient will benefit from skilled therapeutic intervention in order to improve the following deficits and impairments:  Pain, Hypomobility, Decreased range of motion, Decreased strength, Abnormal gait, Decreased activity tolerance, Decreased balance, Impaired flexibility, Increased muscle spasms  Visit Diagnosis: Cervicalgia  Muscle weakness  (generalized)  Chronic bilateral low back pain without sciatica  Other abnormalities of gait and mobility     Problem List Patient Active Problem List   Diagnosis Date Noted   Greater trochanteric bursitis of right hip 05/16/2019   Impingement syndrome of left shoulder 12/29/2017   Laceration of leg, left 06/24/2016   Primary osteoarthritis of left hip 05/25/2016   Toe pain, right 10/07/2015   Left cervical radiculopathy 03/27/2015   Spondylolisthesis at L4-L5 level 02/17/2015   Trochanteric bursitis of left hip 02/22/2014   Osteoarthritis of right knee 01/25/2014    Demontrez Rindfleisch, PT 07/08/2021, 2:42 PM  [facility] [address] [place], [place], [address] Phone: [phone]   Fax:  [phone]  Name: Robin Malone MRN: [id number] Date of Birth: 1934/05/17

## 2021-07-08 NOTE — Patient Instructions (Signed)
Access Code: [email or web address] URL: [email or web address] Date: 07/08/2021 Prepared by: Margretta Ditty  Exercises Sidelying Thoracic Rotation with Open Book - 2 x daily - 7 x weekly - 1 sets - 10 reps Hooklying Shoulder T - 2 x daily - 7 x weekly - 1 sets - 10 reps Supine March - 2 x daily - 7 x weekly - 1 sets - 10 reps Sit to Stand - 2 x daily - 7 x weekly - 1 sets - 5-10 reps Seated Scapular Retraction with External Rotation - 2 x daily - 7 x weekly - 1 sets - 10 reps

## 2021-07-10 ENCOUNTER — Ambulatory Visit: Payer: Medicare HMO | Admitting: Physical Therapy

## 2021-07-10 ENCOUNTER — Encounter: Payer: Self-pay | Admitting: Physical Therapy

## 2021-07-10 ENCOUNTER — Other Ambulatory Visit: Payer: Self-pay

## 2021-07-10 DIAGNOSIS — M542 Cervicalgia: Secondary | ICD-10-CM | POA: Diagnosis not present

## 2021-07-10 DIAGNOSIS — M6281 Muscle weakness (generalized): Secondary | ICD-10-CM

## 2021-07-10 NOTE — Therapy (Signed)
[facility] [place] [address] [place], [place], [address] Phone: [phone]   Fax:  [phone]  Physical Therapy Treatment  Patient Details  Name: Robin Malone MRN: [id number] Date of Birth: August 20, 1934 Referring Provider (PT): Joetta Manners, MD   Encounter Date: 07/10/2021   PT End of Session - 07/10/21 1108     Visit Number 2    Number of Visits 12    Date for PT Re-Evaluation 08/19/21    Authorization Type humana- requested 12 visits    Authorization - Visit Number 2    Authorization - Number of Visits 12    Progress Note Due on Visit 10    PT Start Time 1105    PT Stop Time 1152    PT Time Calculation (min) 47 min    Activity Tolerance Patient tolerated treatment well    Behavior During Therapy [facility] for tasks assessed/performed             History reviewed. No pertinent past medical history.  Past Surgical History:  Procedure Laterality Date   BACK SURGERY     Capsulotomy MPJ Right 10/01/2016   RT #2 Toe   Hammer Toe Repair Right 10/01/2016   RT #2 Toe    There were no vitals filed for this visit.   Subjective Assessment - 07/10/21 1109     Subjective Pt reports she took 4 ibruofen prior to arrival in preparation of exercises here.  She reports that she was sore after the eval. She states she is awaiting MRI results.    Pertinent History Neck surgery in the 90s, back surgery inthe 90s, hypothyroidism    Patient Stated Goals help strengthen my legs, reduce pain in back and neck    Currently in Pain? Yes    Pain Score 5     Pain Location Neck    Pain Orientation Lower    Pain Descriptors / Indicators Burning    Aggravating Factors  being busy.    Pain Relieving Factors medicine                [facility] PT Assessment - 07/10/21 0001       Assessment   Medical Diagnosis muscle weakness    Referring Provider (PT) Joetta Manners, MD    Onset Date/Surgical Date 07/02/21    Hand Dominance Left                [facility] Adult PT Treatment/Exercise - 07/10/21 0001       Neck Exercises: Seated   W Back 10 reps      Neck Exercises: Supine   Shoulder ABduction Both;10 reps   to T position   Shoulder Abduction Limitations arms up to 90 deg flexion, then out to T.      Lumbar Exercises: Seated   Sit to Stand 10 reps   no UE support. cues to control descent     Lumbar Exercises: Supine   Bent Knee Raise 10 reps      Lumbar Exercises: Sidelying   Other Sidelying Lumbar Exercises open book x 8 reps each side.      Modalities   Modalities Moist Heat;Electrical Stimulation      Moist Heat Therapy   Number Minutes Moist Heat 10 Minutes    Moist Heat Location Cervical;Lumbar Spine      Electrical Stimulation   Electrical Stimulation Location bilat upper trap / levator    Electrical Stimulation Action IFC    Electrical Stimulation Parameters 10  min, intensity to tolernace.    Electrical Stimulation Goals Pain      Manual Therapy   Manual Therapy Soft tissue mobilization    Manual therapy comments skilled palpation to assess response to STM and dry needling    Soft tissue mobilization STM upper trap bilat, L rhomboid, bilateral parascapular region              Trigger Point Dry Needling - 07/10/21 1138     Consent Given? Yes    Education Handout Provided Yes    Muscles Treated Head and Neck Upper trapezius;Levator scapulae    Muscles Treated Upper Quadrant Rhomboids    Dry Needling Comments L rhomboid, bilat upper trap/levator    Upper Trapezius Response Twitch reponse elicited;Palpable increased muscle length    Levator Scapulae Response Twitch response elicited;Palpable increased muscle length    Rhomboids Response Palpable increased muscle length                       PT Long Term Goals - 07/08/21 1433       PT LONG TERM GOAL #1   Title The patient will be indep with HEP.    Time 6    Period Weeks    Target Date 08/19/21      PT LONG TERM GOAL  #2   Title The patient will report neck pain < or equal to 2/10.    Baseline 8/10 today-- responds well to initial HEP    Time 6    Period Weeks    Target Date 08/19/21      PT LONG TERM GOAL #3   Title The patient will demonstrate moving floor<>stand using chair/couch/firm surface for UE support mod indep.    Time 6    Period Weeks    Target Date 08/19/21      PT LONG TERM GOAL #4   Title The patient will improve Berg balance score from 45/56 to > or equal to 50/56 demonstrating dec'd risk for falls.    Time 6    Period Weeks    Target Date 08/19/21      PT LONG TERM GOAL #5   Title The patient will be further assessed on gait speed and demo improvement of 0.5 ft/sec.    Time 6    Period Weeks    Target Date 08/19/21                   Plan - 07/10/21 1143     Clinical Impression Statement Pt responded wll to DN with lengthening of tissue, per supervising PT who performed DN. Pt tolerated all exercises well, without increase in pain.  Added LTR per pt request.  Goals are ongoing.    Personal Factors and Comorbidities Comorbidity 2    Comorbidities arthritis, spondylolisthesis    Examination-Activity Limitations Locomotion Level;Lift;Stand;Squat;Stairs    Examination-Participation Restrictions Community Activity;Driving;Cleaning    Stability/Clinical Decision Making Evolving/Moderate complexity    Rehab Potential Good    PT Frequency 2x / week    PT Duration 6 weeks    PT Treatment/Interventions Gait training;Stair training;ADLs/Self Care Home Management;Patient/family education;Therapeutic activities;Therapeutic exercise;Balance training;Neuromuscular re-education;Manual techniques;Dry needling;Taping;Aquatic Therapy;Electrical Stimulation;Moist Heat    PT Next Visit Plan Progress HEP; initially focus on c-spine due to severity of pain today, work on Financial controller, LE strengthening, balance and gait *hip abductors weak -- strengthen to improve gait    PT  Home Exercise Plan Access Code: [email or web address]    Consulted  and Agree with Plan of Care Patient             Patient will benefit from skilled therapeutic intervention in order to improve the following deficits and impairments:  Pain, Hypomobility, Decreased range of motion, Decreased strength, Abnormal gait, Decreased activity tolerance, Decreased balance, Impaired flexibility, Increased muscle spasms  Visit Diagnosis: Muscle weakness (generalized)  Cervicalgia     Problem List Patient Active Problem List   Diagnosis Date Noted   Greater trochanteric bursitis of right hip 05/16/2019   Impingement syndrome of left shoulder 12/29/2017   Laceration of leg, left 06/24/2016   Primary osteoarthritis of left hip 05/25/2016   Toe pain, right 10/07/2015   Left cervical radiculopathy 03/27/2015   Spondylolisthesis at L4-L5 level 02/17/2015   Trochanteric bursitis of left hip 02/22/2014   Osteoarthritis of right knee 01/25/2014    Mayer Camel, PTA 07/10/21 11:46 AM  [facility] [place] [address] [place], [place], [address] Phone: [phone]   Fax:  [phone]  Name: Lidia Clavijo MRN: [id number] Date of Birth: 10-31-1934

## 2021-07-15 ENCOUNTER — Encounter: Payer: Self-pay | Admitting: Rehabilitative and Restorative Service Providers"

## 2021-07-15 ENCOUNTER — Other Ambulatory Visit: Payer: Self-pay

## 2021-07-15 ENCOUNTER — Ambulatory Visit (INDEPENDENT_AMBULATORY_CARE_PROVIDER_SITE_OTHER): Payer: Medicare HMO | Admitting: Rehabilitative and Restorative Service Providers"

## 2021-07-15 DIAGNOSIS — M545 Low back pain, unspecified: Secondary | ICD-10-CM | POA: Diagnosis not present

## 2021-07-15 DIAGNOSIS — G8929 Other chronic pain: Secondary | ICD-10-CM

## 2021-07-15 DIAGNOSIS — M542 Cervicalgia: Secondary | ICD-10-CM | POA: Diagnosis not present

## 2021-07-15 DIAGNOSIS — M6281 Muscle weakness (generalized): Secondary | ICD-10-CM

## 2021-07-15 DIAGNOSIS — R2689 Other abnormalities of gait and mobility: Secondary | ICD-10-CM

## 2021-07-15 NOTE — Therapy (Addendum)
[facility] [place] [address] [place], [place], [address] Phone: [phone]   Fax:  [phone]  Physical Therapy Treatment  Patient Details  Name: Robin Malone MRN: [id number] Date of Birth: [birth year] Referring Provider (PT): Joetta Manners, MD   Encounter Date: 07/15/2021   PT End of Session - 07/15/21 1312     Visit Number 3    Number of Visits 12    Date for PT Re-Evaluation 08/19/21    Authorization Type humana- requested 12 visits    Authorization - Visit Number 3    Authorization - Number of Visits 12    Progress Note Due on Visit 10    PT Start Time 1020    PT Stop Time 1100    PT Time Calculation (min) 40 min    Activity Tolerance Patient tolerated treatment well    Behavior During Therapy [facility] for tasks assessed/performed             History reviewed. No pertinent past medical history.  Past Surgical History:  Procedure Laterality Date   BACK SURGERY     Capsulotomy MPJ Right 10/01/2016   RT #2 Toe   Hammer Toe Repair Right 10/01/2016   RT #2 Toe    There were no vitals filed for this visit.   Subjective Assessment - 07/15/21 1026     Subjective The patient reports she was sore on Saturday and had to stay on a heating pad and then again on Monday (after exercising on Sunday).    Pertinent History Neck surgery in the 90s, back surgery inthe 90s, hypothyroidism    Patient Stated Goals help strengthen my legs, reduce pain in back and neck    Currently in Pain? Yes    Pain Location Neck    Pain Orientation Lower    Pain Descriptors / Indicators Burning                [facility] PT Assessment - 07/15/21 1042       Assessment   Medical Diagnosis muscle weakness    Referring Provider (PT) Joetta Manners, MD    Onset Date/Surgical Date 07/02/21                           [facility] Adult PT Treatment/Exercise - 07/15/21 1042       Neuro Re-ed    Neuro Re-ed Details  sidestepping  with UEs abducted to engage upper back musculature      Exercises   Exercises Neck;Lumbar      Neck Exercises: Standing   Other Standing Exercises warm up rolling a ball up/down the wall x 10 reps for engaging shoulder/upper back/neck    Other Standing Exercises standing L leaning against physioball; standing retraction c 5 reps      Neck Exercises: Supine   Cervical Isometrics Right lateral flexion;Left lateral flexion;Flexion;5 secs;5 reps    Neck Retraction 5 reps    Other Supine Exercise spinal decompression performing anatomical supine position with scap squeeze x 10 reps, then working on neck retraction with alternating shoulder flexion x 5 reps R and L      Lumbar Exercises: Seated   Sit to Stand 5 reps      Lumbar Exercises: Supine   Bridge 5 reps    Bridge Limitations partial bridge    Other Supine Lumbar Exercises hooklying bent knee fallouts x 5 reps R and L sides    Other Supine Lumbar Exercises  isometric hip extension into ball for spinal decompression exercises                         PT Long Term Goals - 07/08/21 1433       PT LONG TERM GOAL #1   Title The patient will be indep with HEP.    Time 6    Period Weeks    Target Date 08/19/21      PT LONG TERM GOAL #2   Title The patient will report neck pain < or equal to 2/10.    Baseline 8/10 today-- responds well to initial HEP    Time 6    Period Weeks    Target Date 08/19/21      PT LONG TERM GOAL #3   Title The patient will demonstrate moving floor<>stand using chair/couch/firm surface for UE support mod indep.    Time 6    Period Weeks    Target Date 08/19/21      PT LONG TERM GOAL #4   Title The patient will improve Berg balance score from 45/56 to > or equal to 50/56 demonstrating dec'd risk for falls.    Time 6    Period Weeks    Target Date 08/19/21      PT LONG TERM GOAL #5   Title The patient will be further assessed on gait speed and demo improvement of 0.5 ft/sec.    Time  6    Period Weeks    Target Date 08/19/21                Plan - 07/16/21 1821     Clinical Impression Statement PT modified HEP to reduce pain and improve tolerance to activities.  Continue working to [employer].    PT Treatment/Interventions Gait training;Stair training;ADLs/Self Care Home Management;Patient/family education;Therapeutic activities;Therapeutic exercise;Balance training;Neuromuscular re-education;Manual techniques;Dry needling;Taping;Aquatic Therapy;Electrical Stimulation;Moist Heat    PT Next Visit Plan Progress HEP; initially focus on c-spine due to severity of pain today, work on Financial controller, LE strengthening, balance and gait *hip abductors weak -- strengthen to improve gait    PT Home Exercise Plan Access Code: [email or web address]    Consulted and Agree with Plan of Care Patient                  Patient will benefit from skilled therapeutic intervention in order to improve the following deficits and impairments:     Visit Diagnosis: Muscle weakness (generalized)  Cervicalgia  Chronic bilateral low back pain without sciatica  Other abnormalities of gait and mobility     Problem List Patient Active Problem List   Diagnosis Date Noted   Greater trochanteric bursitis of right hip 05/16/2019   Impingement syndrome of left shoulder 12/29/2017   Laceration of leg, left 06/24/2016   Primary osteoarthritis of left hip 05/25/2016   Toe pain, right 10/07/2015   Left cervical radiculopathy 03/27/2015   Spondylolisthesis at L4-L5 level 02/17/2015   Trochanteric bursitis of left hip 02/22/2014   Osteoarthritis of right knee 01/25/2014   Margretta Ditty, MPT   Elbia Paro 07/15/2021, 1:16 PM  [facility] [address] [place], [place], [address] Phone: [phone]   Fax:  [phone]  Name: Robin Malone MRN: [id number] Date of Birth: 1934/07/20

## 2021-07-17 ENCOUNTER — Encounter: Payer: Self-pay | Admitting: Rehabilitative and Restorative Service Providers"

## 2021-07-17 ENCOUNTER — Other Ambulatory Visit: Payer: Self-pay

## 2021-07-17 ENCOUNTER — Ambulatory Visit: Payer: Medicare HMO | Admitting: Rehabilitative and Restorative Service Providers"

## 2021-07-17 DIAGNOSIS — R2689 Other abnormalities of gait and mobility: Secondary | ICD-10-CM

## 2021-07-17 DIAGNOSIS — M545 Low back pain, unspecified: Secondary | ICD-10-CM

## 2021-07-17 DIAGNOSIS — M542 Cervicalgia: Secondary | ICD-10-CM | POA: Diagnosis not present

## 2021-07-17 DIAGNOSIS — G8929 Other chronic pain: Secondary | ICD-10-CM

## 2021-07-17 DIAGNOSIS — M6281 Muscle weakness (generalized): Secondary | ICD-10-CM | POA: Diagnosis not present

## 2021-07-17 NOTE — Therapy (Signed)
[facility] [place] [address] [place], [place], [address] Phone: [phone]   Fax:  [phone]  Physical Therapy Treatment  Patient Details  Name: Robin Malone MRN: [id number] Date of Birth: 1934-02-14 Referring Provider (PT): Joetta Manners, MD   Encounter Date: 07/17/2021   PT End of Session - 07/17/21 1516     Visit Number 4    Number of Visits 12    Date for PT Re-Evaluation 08/19/21    Authorization Type humana- requested 12 visits    Authorization - Visit Number 4    Authorization - Number of Visits 12    Progress Note Due on Visit 10    PT Start Time 1321    PT Stop Time 1402    PT Time Calculation (min) 41 min    Activity Tolerance Patient tolerated treatment well    Behavior During Therapy [facility] for tasks assessed/performed             History reviewed. No pertinent past medical history.  Past Surgical History:  Procedure Laterality Date   BACK SURGERY     Capsulotomy MPJ Right 10/01/2016   RT #2 Toe   Hammer Toe Repair Right 10/01/2016   RT #2 Toe    There were no vitals filed for this visit.   Subjective Assessment - 07/17/21 1322     Subjective The patient gets more soreness t/o the day as she fatigues in her upper and lower back.    Pertinent History Neck surgery in the 90s, back surgery inthe 90s, hypothyroidism    Patient Stated Goals help strengthen my legs, reduce pain in back and neck    Currently in Pain? Yes    Pain Score 4     Pain Location Neck    Pain Orientation Lower    Pain Descriptors / Indicators Burning    Pain Type Chronic pain;Acute pain    Pain Onset More than a month ago    Pain Frequency Intermittent    Aggravating Factors  fatigue, later in day    Pain Relieving Factors medicine                [facility] PT Assessment - 07/17/21 1323       Assessment   Medical Diagnosis muscle weakness    Referring Provider (PT) Joetta Manners, MD    Onset Date/Surgical Date  07/02/21    Hand Dominance Left      Strength   Right Hip ABduction 2/5    Left Hip ABduction 3/5                           [facility] Adult PT Treatment/Exercise - 07/17/21 1323       Ambulation/Gait   Ambulation/Gait Yes    Ambulation/Gait Assistance 6: Modified independent (Device/Increase time)    Ambulation Distance (Feet) 150 Feet    Assistive device None      Neuro Re-ed    Neuro Re-ed Details  compliant surface standing with eyes open and eyes closed with CGA; alternating foot taps to 4" surfaces; wall bumps x 10 reps      Exercises   Exercises Neck;Lumbar;Shoulder      Neck Exercises: Machines for Strengthening   UBE (Upper Arm Bike) L1 x 2.5 minutes forward, 1 minute backward      Neck Exercises: Seated   Other Seated Exercise shoulder L's with yellow band while sitting on physiodisc  Lumbar Exercises: Standing   Other Standing Lumbar Exercises 4" step ups x 10 reps R and L anteriorly and laterally    Other Standing Lumbar Exercises lateral step ups 4" surface x 15 reps R and L; wall slides x 10 reps      Lumbar Exercises: Seated   Other Seated Lumbar Exercises Seated on physiodisc performing alternating UE reaching within tolerable ROM L UE      Lumbar Exercises: Supine   Bridge 10 reps    Other Supine Lumbar Exercises supine stretch wit htowel roll under lumbar region to gain lumbar extension    Other Supine Lumbar Exercises isometric hip extension into ball for spinal decompression exercises      Lumbar Exercises: Sidelying   Hip Abduction Right;Left;15 reps      Shoulder Exercises: Supine   External Rotation AAROM;Left;10 reps    Flexion AROM;10 reps;Left                         PT Long Term Goals - 07/08/21 1433       PT LONG TERM GOAL #1   Title The patient will be indep with HEP.    Time 6    Period Weeks    Target Date 08/19/21      PT LONG TERM GOAL #2   Title The patient will report neck pain < or equal to  2/10.    Baseline 8/10 today-- responds well to initial HEP    Time 6    Period Weeks    Target Date 08/19/21      PT LONG TERM GOAL #3   Title The patient will demonstrate moving floor<>stand using chair/couch/firm surface for UE support mod indep.    Time 6    Period Weeks    Target Date 08/19/21      PT LONG TERM GOAL #4   Title The patient will improve Berg balance score from 45/56 to > or equal to 50/56 demonstrating dec'd risk for falls.    Time 6    Period Weeks    Target Date 08/19/21      PT LONG TERM GOAL #5   Title The patient will be further assessed on gait speed and demo improvement of 0.5 ft/sec.    Time 6    Period Weeks    Target Date 08/19/21                   Plan - 07/17/21 1529     Clinical Impression Statement The patient tolerated LE strengthening well today.  She has R LE weakness worse than L with 2/5 hip abduction R side.  PT progressing strengthening.    PT Treatment/Interventions Gait training;Stair training;ADLs/Self Care Home Management;Patient/family education;Therapeutic activities;Therapeutic exercise;Balance training;Neuromuscular re-education;Manual techniques;Dry needling;Taping;Aquatic Therapy;Electrical Stimulation;Moist Heat    PT Next Visit Plan Progress HEP; initially focus on c-spine due to severity of pain today, work on Financial controller, LE strengthening, balance and gait *hip abductors weak -- strengthen to improve gait    PT Home Exercise Plan Access Code: [email or web address]    Consulted and Agree with Plan of Care Patient             Patient will benefit from skilled therapeutic intervention in order to improve the following deficits and impairments:     Visit Diagnosis: Muscle weakness (generalized)  Cervicalgia  Chronic bilateral low back pain without sciatica  Other abnormalities of gait and mobility     Problem  List Patient Active Problem List   Diagnosis Date Noted   Greater trochanteric bursitis of  right hip 05/16/2019   Impingement syndrome of left shoulder 12/29/2017   Laceration of leg, left 06/24/2016   Primary osteoarthritis of left hip 05/25/2016   Toe pain, right 10/07/2015   Left cervical radiculopathy 03/27/2015   Spondylolisthesis at L4-L5 level 02/17/2015   Trochanteric bursitis of left hip 02/22/2014   Osteoarthritis of right knee 01/25/2014    Keeghan Mcintire, PT 07/17/2021, 3:31 PM  [facility] [address] [place], [place], [address] Phone: [phone]   Fax:  [phone]  Name: Robin Malone MRN: [id number] Date of Birth: Apr 19, 1934

## 2021-07-22 ENCOUNTER — Other Ambulatory Visit: Payer: Self-pay

## 2021-07-22 ENCOUNTER — Ambulatory Visit: Payer: Medicare HMO | Admitting: Physical Therapy

## 2021-07-22 ENCOUNTER — Encounter: Payer: Self-pay | Admitting: Physical Therapy

## 2021-07-22 DIAGNOSIS — M545 Low back pain, unspecified: Secondary | ICD-10-CM | POA: Diagnosis not present

## 2021-07-22 DIAGNOSIS — M542 Cervicalgia: Secondary | ICD-10-CM

## 2021-07-22 DIAGNOSIS — R2689 Other abnormalities of gait and mobility: Secondary | ICD-10-CM

## 2021-07-22 DIAGNOSIS — M6281 Muscle weakness (generalized): Secondary | ICD-10-CM | POA: Diagnosis not present

## 2021-07-22 DIAGNOSIS — G8929 Other chronic pain: Secondary | ICD-10-CM

## 2021-07-22 NOTE — Patient Instructions (Signed)
TENS UNIT: This is helpful for muscle pain and spasm.   Search and Purchase a TENS 7000 2nd edition at [employer] It should be less than $30.     TENS unit instructions: Do not shower or bathe with the unit on Turn the unit off before removing electrodes or batteries If the electrodes lose stickiness add a drop of water to the electrodes after they are disconnected from the unit and place on plastic sheet. If you continued to have difficulty, call the TENS unit company to purchase more electrodes. Do not apply lotion on the skin area prior to use. Make sure the skin is clean and dry as this will help prolong the life of the electrodes. After use, always check skin for unusual red areas, rash or other skin difficulties. If there are any skin problems, does not apply electrodes to the same area. Never remove the electrodes from the unit by pulling the wires. Do not use the TENS unit or electrodes other than as directed. Do not change electrode placement without consultating your therapist or physician. Keep 2 fingers with between each electrode. Wear time ratio is 2:1, on to off times.    For example on for 30 minutes off for 15 minutes and then on for 30 minutes off for 15 minutes   

## 2021-07-22 NOTE — Therapy (Signed)
[facility] [place] [address] [place], [place], [address] Phone: [phone]   Fax:  [phone]  Physical Therapy Treatment  Patient Details  Name: Robin Malone MRN: [id number] Date of Birth: [birth year] Referring Provider (PT): Joetta Manners, MD   Encounter Date: 07/22/2021   PT End of Session - 07/22/21 1109     Visit Number 5    Number of Visits 12    Date for PT Re-Evaluation 08/19/21    Authorization Type humana- requested 12 visits    Authorization - Visit Number 5    Authorization - Number of Visits 12    Progress Note Due on Visit 10    PT Start Time 1101    PT Stop Time 1149    PT Time Calculation (min) 48 min    Activity Tolerance Patient tolerated treatment well    Behavior During Therapy [facility] for tasks assessed/performed             History reviewed. No pertinent past medical history.  Past Surgical History:  Procedure Laterality Date   BACK SURGERY     Capsulotomy MPJ Right 10/01/2016   RT #2 Toe   Hammer Toe Repair Right 10/01/2016   RT #2 Toe    There were no vitals filed for this visit.   Subjective Assessment - 07/22/21 1107     Subjective Pt reports she is sore from working in kitchen to clear out dishes she doesn't use. She said she has been up / down step stool.  Using a heating pad afterwards.    Pertinent History Neck surgery in the 90s, back surgery inthe 90s, hypothyroidism    Patient Stated Goals help strengthen my legs, reduce pain in back and neck    Currently in Pain? Yes    Pain Score 8     Pain Location Back    Pain Orientation Lower    Pain Descriptors / Indicators Aching    Aggravating Factors  fatigue, bending    Pain Relieving Factors heating pad, medicine.                [facility] PT Assessment - 07/22/21 0001       Assessment   Medical Diagnosis muscle weakness    Referring Provider (PT) Joetta Manners, MD    Onset Date/Surgical Date 07/02/21    Hand Dominance  Left              [facility] Adult PT Treatment/Exercise - 07/22/21 0001       Ambulation/Gait   Ambulation/Gait Assistance 6: Modified independent (Device/Increase time)    Ambulation Distance (Feet) 80 Feet   2 trials   Assistive device Straight cane    Gait Pattern Step-through pattern;Right flexed knee in stance;Trendelenburg;Narrow base of support    Gait Comments cues for cane placement with step through pattern.  cues for posture.      Lumbar Exercises: Stretches   Passive Hamstring Stretch Right;Left;2 reps;20 seconds   seated, straight back, hip hinge   Hip Flexor Stretch Right;2 reps;Left;1 rep;20 seconds   seated   Piriformis Stretch Right;Left;2 reps;20 seconds   seated, pulling knee towards chest.     Lumbar Exercises: Aerobic   Nustep L4: arms/legs x 5 min for warm up.      Lumbar Exercises: Standing   Other Standing Lumbar Exercises Lateral step ups RLE, 4" x 10, 6" x 10, LLE on 6" x 10.  retro gait with intermittent UE on counter;  forward marching with  high knees    Other Standing Lumbar Exercises side stepping with increased step height R/L      Lumbar Exercises: Seated   Sit to Stand 5 reps   eccentric lowering, no hands.     Shoulder Exercises: Stretch   Wall Stretch - Flexion 5 reps   sliding arms up cabinets     Moist Heat Therapy   Number Minutes Moist Heat 10 Minutes    Moist Heat Location Lumbar Spine      Electrical Stimulation   Electrical Stimulation Location bilat lower back    Electrical Stimulation Action TENS    Electrical Stimulation Parameters 10 min, intensity to tolerance    Electrical Stimulation Goals Pain                    PT Education - 07/22/21 1236     Education Details TENS info.    Person(s) Educated Patient    Methods Explanation;Handout    Comprehension Verbalized understanding                 PT Long Term Goals - 07/08/21 1433       PT LONG TERM GOAL #1   Title The patient will be indep with HEP.     Time 6    Period Weeks    Target Date 08/19/21      PT LONG TERM GOAL #2   Title The patient will report neck pain < or equal to 2/10.    Baseline 8/10 today-- responds well to initial HEP    Time 6    Period Weeks    Target Date 08/19/21      PT LONG TERM GOAL #3   Title The patient will demonstrate moving floor<>stand using chair/couch/firm surface for UE support mod indep.    Time 6    Period Weeks    Target Date 08/19/21      PT LONG TERM GOAL #4   Title The patient will improve Berg balance score from 45/56 to > or equal to 50/56 demonstrating dec'd risk for falls.    Time 6    Period Weeks    Target Date 08/19/21      PT LONG TERM GOAL #5   Title The patient will be further assessed on gait speed and demo improvement of 0.5 ft/sec.    Time 6    Period Weeks    Target Date 08/19/21                   Plan - 07/22/21 1117     Clinical Impression Statement Pt observed with narrow gait pattern, antalgic. Encouraged pt to consider using cane in community. RLE much tighter than LLE with stretches. She reported reduction of back pain with exercises to 2/10, and further reduction with MHP/ estim. Progressing gradually towards goals.    Comorbidities arthritis, spondylolisthesis    Rehab Potential Good    PT Frequency 2x / week    PT Duration 6 weeks    PT Treatment/Interventions Gait training;Stair training;ADLs/Self Care Home Management;Patient/family education;Therapeutic activities;Therapeutic exercise;Balance training;Neuromuscular re-education;Manual techniques;Dry needling;Taping;Aquatic Therapy;Electrical Stimulation;Moist Heat    PT Next Visit Plan Progress HEP; initially focus on c-spine due to severity of pain today, work on Financial controller, LE strengthening, balance and gait *hip abductors weak -- strengthen to improve gait    PT Home Exercise Plan Access Code: [email or web address]    Consulted and Agree with Plan of Care Patient  Patient  will benefit from skilled therapeutic intervention in order to improve the following deficits and impairments:  Pain, Hypomobility, Decreased range of motion, Decreased strength, Abnormal gait, Decreased activity tolerance, Decreased balance, Impaired flexibility, Increased muscle spasms  Visit Diagnosis: Muscle weakness (generalized)  Cervicalgia  Chronic bilateral low back pain without sciatica  Other abnormalities of gait and mobility     Problem List Patient Active Problem List   Diagnosis Date Noted   Greater trochanteric bursitis of right hip 05/16/2019   Impingement syndrome of left shoulder 12/29/2017   Laceration of leg, left 06/24/2016   Primary osteoarthritis of left hip 05/25/2016   Toe pain, right 10/07/2015   Left cervical radiculopathy 03/27/2015   Spondylolisthesis at L4-L5 level 02/17/2015   Trochanteric bursitis of left hip 02/22/2014   Osteoarthritis of right knee 01/25/2014    Mayer Camel, PTA 07/22/21 12:37 PM   [facility] [place] [address] [place], [place], [address] Phone: [phone]   Fax:  [phone]  Name: Robin Malone MRN: [id number] Date of Birth: 31-Jul-1934

## 2021-07-24 ENCOUNTER — Other Ambulatory Visit: Payer: Self-pay

## 2021-07-24 ENCOUNTER — Ambulatory Visit: Payer: Medicare HMO | Admitting: Rehabilitative and Restorative Service Providers"

## 2021-07-24 DIAGNOSIS — R2689 Other abnormalities of gait and mobility: Secondary | ICD-10-CM | POA: Diagnosis not present

## 2021-07-24 DIAGNOSIS — M6281 Muscle weakness (generalized): Secondary | ICD-10-CM | POA: Diagnosis not present

## 2021-07-24 DIAGNOSIS — M542 Cervicalgia: Secondary | ICD-10-CM

## 2021-07-24 DIAGNOSIS — M545 Low back pain, unspecified: Secondary | ICD-10-CM | POA: Diagnosis not present

## 2021-07-24 DIAGNOSIS — G8929 Other chronic pain: Secondary | ICD-10-CM

## 2021-07-24 NOTE — Therapy (Signed)
[facility] [place] [address] [place], [place], [address] Phone: [phone]   Fax:  [phone]  Physical Therapy Treatment  Patient Details  Name: Robin Malone MRN: [id number] Date of Birth: Oct 29, 1934 Referring Provider (PT): Joetta Manners, MD   Encounter Date: 07/24/2021   PT End of Session - 07/24/21 1107     Visit Number 6    Number of Visits 12    Date for PT Re-Evaluation 08/19/21    Authorization Type humana- requested 12 visits    Authorization - Visit Number 6    Authorization - Number of Visits 12    Progress Note Due on Visit 10    PT Start Time 1104    PT Stop Time 1145    PT Time Calculation (min) 41 min    Activity Tolerance Patient tolerated treatment well    Behavior During Therapy [facility] for tasks assessed/performed             No past medical history on file.  Past Surgical History:  Procedure Laterality Date   BACK SURGERY     Capsulotomy MPJ Right 10/01/2016   RT #2 Toe   Hammer Toe Repair Right 10/01/2016   RT #2 Toe    There were no vitals filed for this visit.   Subjective Assessment - 07/24/21 1106     Subjective The patient reports she has been cleaning out cabinets and going up and down the stool.  She has not had time to do HEP.  Her neck is without pain today.    Pertinent History Neck surgery in the 90s, back surgery inthe 90s, hypothyroidism    Patient Stated Goals help strengthen my legs, reduce pain in back and neck    Currently in Pain? No/denies                [facility] PT Assessment - 07/24/21 1116       Assessment   Medical Diagnosis muscle weakness    Referring Provider (PT) Joetta Manners, MD    Onset Date/Surgical Date 07/02/21    Hand Dominance Left                           [facility] Adult PT Treatment/Exercise - 07/24/21 1117       Neuro Re-ed    Neuro Re-ed Details  Compliant surface standing with 1 lb reaches into punching; rocker baord  lateral and a/p planes with min A and intermittent UE support.  Marching in place, head turn standing on foam.  Sidestepping and single leg standing.  Cone tapping for single leg stance control.      Exercises   Exercises Neck;Lumbar;Shoulder      Neck Exercises: Standing   Other Standing Exercises W exercise on foam with min A      Lumbar Exercises: Standing   Other Standing Lumbar Exercises standing anti-rotation red band R And L sides x 10 reps    Other Standing Lumbar Exercises side/lateral step ups to 4" step with cone tapping for single leg support with intermittent UE support      Lumbar Exercises: Supine   Bridge 10 reps      Lumbar Exercises: Sidelying   Clam Right;Left;10 reps    Hip Abduction Right;Left;10 reps    Hip Abduction Weights (lbs) needed tactile assist for tecvhnique-- weaker on the R side      Manual Therapy   Manual Therapy Soft tissue mobilization;Manual Traction  Soft tissue mobilization STM bilat upper trap, suboccipitals and paraspinal muscles c-spine    Manual Traction gentle cervical manual traction                    PT Education - 07/24/21 1144     Education Details HEP updated    Person(s) Educated Patient    Methods Explanation;Demonstration;Handout    Comprehension Verbalized understanding;Returned demonstration                 PT Long Term Goals - 07/24/21 1245       PT LONG TERM GOAL #1   Title The patient will be indep with HEP.    Time 6    Period Weeks    Status On-going      PT LONG TERM GOAL #2   Title The patient will report neck pain < or equal to 2/10.    Baseline 8/10 today at eval, 0/10 today    Time 6    Period Weeks    Status Achieved      PT LONG TERM GOAL #3   Title The patient will demonstrate moving floor<>stand using chair/couch/firm surface for UE support mod indep.    Time 6    Period Weeks    Status On-going      PT LONG TERM GOAL #4   Title The patient will improve Berg balance  score from 45/56 to > or equal to 50/56 demonstrating dec'd risk for falls.    Time 6    Period Weeks    Status On-going      PT LONG TERM GOAL #5   Title The patient will be further assessed on gait speed and demo improvement of 0.5 ft/sec.    Time 6    Period Weeks    Status On-going                   Plan - 07/24/21 1244     Clinical Impression Statement The patient continues to improve with neck pain.  PT is focusing more on balance, hip strenth, and core stability working to [employer] and functional strengthening.  PT also working to engage UEs in functional movements during balance tasks (L side has weakness).  Plan to continue to progress to LTGs.    PT Treatment/Interventions Gait training;Stair training;ADLs/Self Care Home Management;Patient/family education;Therapeutic activities;Therapeutic exercise;Balance training;Neuromuscular re-education;Manual techniques;Dry needling;Taping;Aquatic Therapy;Electrical Stimulation;Moist Heat    PT Next Visit Plan Measure gait speed-- shifting focus from neck/upper back to balance/gait and LE strengthening.  Progress to LTGs.    PT Home Exercise Plan Access Code: [email or web address]    Consulted and Agree with Plan of Care Patient             Patient will benefit from skilled therapeutic intervention in order to improve the following deficits and impairments:     Visit Diagnosis: Muscle weakness (generalized)  Cervicalgia  Chronic bilateral low back pain without sciatica  Other abnormalities of gait and mobility     Problem List Patient Active Problem List   Diagnosis Date Noted   Greater trochanteric bursitis of right hip 05/16/2019   Impingement syndrome of left shoulder 12/29/2017   Laceration of leg, left 06/24/2016   Primary osteoarthritis of left hip 05/25/2016   Toe pain, right 10/07/2015   Left cervical radiculopathy 03/27/2015   Spondylolisthesis at L4-L5 level 02/17/2015   Trochanteric bursitis of left hip  02/22/2014   Osteoarthritis of right knee 01/25/2014   Margretta Ditty, MPT  Gelisa Tieken 07/24/2021, 12:47 PM  [facility] [address] [place], [place], [address] Phone: [phone]   Fax:  [phone]  Name: Robin Malone MRN: [id number] Date of Birth: 1934-07-30

## 2021-07-24 NOTE — Patient Instructions (Signed)
Access Code: [email or web address] URL: [email or web address] Date: 07/24/2021 Prepared by: Margretta Ditty  Exercises Hooklying Shoulder T - 2 x daily - 7 x weekly - 1 sets - 10 reps Supine Lower Trunk Rotation - 1 x daily - 7 x weekly - 1 sets - 5 reps - 5-10 seconds hold Seated Scapular Retraction with External Rotation - 2 x daily - 7 x weekly - 1 sets - 10 reps Sit to Stand - 2 x daily - 7 x weekly - 1 sets - 5-10 reps Doorway Pec Stretch at 60 Elevation - 2 x daily - 7 x weekly - 1 sets - 3 reps - 30 seconds hold Standing Single Leg Stance with Counter Support - 2 x daily - 7 x weekly - 1 sets - 3 reps - 10-15 seconds hold

## 2021-07-27 ENCOUNTER — Encounter: Payer: Medicare HMO | Admitting: Rehabilitative and Restorative Service Providers"

## 2021-07-29 ENCOUNTER — Encounter: Payer: Self-pay | Admitting: Physical Therapy

## 2021-07-29 ENCOUNTER — Other Ambulatory Visit: Payer: Self-pay

## 2021-07-29 ENCOUNTER — Ambulatory Visit: Payer: Medicare HMO | Admitting: Physical Therapy

## 2021-07-29 DIAGNOSIS — G8929 Other chronic pain: Secondary | ICD-10-CM | POA: Diagnosis not present

## 2021-07-29 DIAGNOSIS — M542 Cervicalgia: Secondary | ICD-10-CM

## 2021-07-29 DIAGNOSIS — M6281 Muscle weakness (generalized): Secondary | ICD-10-CM

## 2021-07-29 DIAGNOSIS — M545 Low back pain, unspecified: Secondary | ICD-10-CM | POA: Diagnosis not present

## 2021-07-29 NOTE — Therapy (Signed)
[facility] [address] [place], [place], [address] Phone: [phone]   Fax:  [phone]  Physical Therapy Treatment  Patient Details  Name: Robin Malone MRN: [id number] Date of Birth: [birth year] Referring Provider (PT): Joetta Manners, MD   Encounter Date: 07/29/2021   PT End of Session - 07/29/21 1147     Visit Number 7    Number of Visits 12    Date for PT Re-Evaluation 08/19/21    Authorization Type humana- requested 12 visits    Authorization - Visit Number 7    Authorization - Number of Visits 12    Progress Note Due on Visit 10    PT Start Time 1102    PT Stop Time 1141    PT Time Calculation (min) 39 min    Equipment Utilized During Treatment Gait belt    Activity Tolerance Patient tolerated treatment well    Behavior During Therapy [facility] for tasks assessed/performed             History reviewed. No pertinent past medical history.  Past Surgical History:  Procedure Laterality Date   BACK SURGERY     Capsulotomy MPJ Right 10/01/2016   RT #2 Toe   Hammer Toe Repair Right 10/01/2016   RT #2 Toe    There were no vitals filed for this visit.   Subjective Assessment - 07/29/21 1113     Subjective Pt reports she has been doing bridge, backwards walking and LAQ.   she had some back pain this morning; resolved with rest.    Patient Stated Goals help strengthen my legs, reduce pain in back and neck    Currently in Pain? No/denies    Pain Score 0-No pain                [facility] PT Assessment - 07/29/21 0001       Assessment   Medical Diagnosis muscle weakness    Referring Provider (PT) Joetta Manners, MD    Onset Date/Surgical Date 07/02/21    Hand Dominance Left      Ambulation/Gait   Ambulation Distance (Feet) 17 Feet    Assistive device None    Ambulation Surface Level;Indoor    Gait velocity 2.60 ft/ sec              [facility] Adult PT Treatment/Exercise - 07/29/21 0001        Lumbar Exercises: Stretches   Piriformis Stretch Right;Left;1 rep;20 seconds   after clams     Lumbar Exercises: Standing   Other Standing Lumbar Exercises standing anti-rotation red band R And L sides x 5 reps    Other Standing Lumbar Exercises standing hip abdct x 10 ea with cues for foot position, hip ext x 10 each leg, with cues for form.      Lumbar Exercises: Seated   Sit to Stand 5 reps   eccentric lowering, no hands. 2 sets   Other Seated Lumbar Exercises seated row with red band x 10 reps, 2 sets      Lumbar Exercises: Sidelying   Clam Right;Left;10 reps   2 sets on RLE            Balance Exercises - 07/29/21 0001       Balance Exercises: Standing   SLS Eyes open;2 reps   12 sec each leg   Rockerboard Lateral;EO;5 reps   side of board taps to balancing in middle.   Retro Gait 2 reps   2 reps  of 20 ft. No UE support   Other Standing Exercises toe taps to 6" step x 10, 2 sets                    PT Long Term Goals - 07/24/21 1245       PT LONG TERM GOAL #1   Title The patient will be indep with HEP.    Time 6    Period Weeks    Status On-going      PT LONG TERM GOAL #2   Title The patient will report neck pain < or equal to 2/10.    Baseline 8/10 today at eval, 0/10 today    Time 6    Period Weeks    Status Achieved      PT LONG TERM GOAL #3   Title The patient will demonstrate moving floor<>stand using chair/couch/firm surface for UE support mod indep.    Time 6    Period Weeks    Status On-going      PT LONG TERM GOAL #4   Title The patient will improve Berg balance score from 45/56 to > or equal to 50/56 demonstrating dec'd risk for falls.    Time 6    Period Weeks    Status On-going      PT LONG TERM GOAL #5   Title The patient will be further assessed on gait speed and demo improvement of 0.5 ft/sec.    Time 6    Period Weeks    Status On-going                   Plan - 07/29/21 1147     Clinical Impression Statement  Improved SLS to 12 sec each leg without support.  Pt tolerated all strengthening and balance exercises well, without increase in back pain.  Gait speed recorded for goal. Hip abdct in standing added to HEP.  Progressing well towards goals.    Rehab Potential Good    PT Frequency 2x / week    PT Duration 6 weeks    PT Treatment/Interventions Gait training;Stair training;ADLs/Self Care Home Management;Patient/family education;Therapeutic activities;Therapeutic exercise;Balance training;Neuromuscular re-education;Manual techniques;Dry needling;Taping;Aquatic Therapy;Electrical Stimulation;Moist Heat    PT Next Visit Plan continue balance/gait and LE strengthening.  Work towards transitions to/from floor for LTG#3  Progress to LTGs.    PT Home Exercise Plan Access Code: [email or web address]    Consulted and Agree with Plan of Care Patient             Patient will benefit from skilled therapeutic intervention in order to improve the following deficits and impairments:  Pain, Hypomobility, Decreased range of motion, Decreased strength, Abnormal gait, Decreased activity tolerance, Decreased balance, Impaired flexibility, Increased muscle spasms  Visit Diagnosis: Muscle weakness (generalized)  Chronic bilateral low back pain without sciatica  Cervicalgia     Problem List Patient Active Problem List   Diagnosis Date Noted   Greater trochanteric bursitis of right hip 05/16/2019   Impingement syndrome of left shoulder 12/29/2017   Laceration of leg, left 06/24/2016   Primary osteoarthritis of left hip 05/25/2016   Toe pain, right 10/07/2015   Left cervical radiculopathy 03/27/2015   Spondylolisthesis at L4-L5 level 02/17/2015   Trochanteric bursitis of left hip 02/22/2014   Osteoarthritis of right knee 01/25/2014   Mayer Camel, PTA 07/29/21 11:50 AM   [facility] [place] [address] [place], [place], [address] Phone: [phone]    Fax:  [phone]  Name: Rick Warnick MRN: [id number] Date of Birth: 15-Sep-1934

## 2021-07-31 ENCOUNTER — Encounter: Payer: Medicare HMO | Admitting: Physical Therapy

## 2021-08-05 ENCOUNTER — Other Ambulatory Visit: Payer: Self-pay

## 2021-08-05 ENCOUNTER — Ambulatory Visit: Payer: Medicare HMO | Admitting: Rehabilitative and Restorative Service Providers"

## 2021-08-05 DIAGNOSIS — R2689 Other abnormalities of gait and mobility: Secondary | ICD-10-CM | POA: Diagnosis not present

## 2021-08-05 DIAGNOSIS — M545 Low back pain, unspecified: Secondary | ICD-10-CM

## 2021-08-05 DIAGNOSIS — M542 Cervicalgia: Secondary | ICD-10-CM | POA: Diagnosis not present

## 2021-08-05 DIAGNOSIS — M6281 Muscle weakness (generalized): Secondary | ICD-10-CM | POA: Diagnosis not present

## 2021-08-05 DIAGNOSIS — G8929 Other chronic pain: Secondary | ICD-10-CM

## 2021-08-05 NOTE — Therapy (Addendum)
Robin Malone, Robin Malone, Robin Malone Phone: [phone]   Fax:  [phone]  Physical Therapy Treatment and Discharge Summary  Patient Details  Name: Robin Malone MRN: [id number] Date of Birth: 1934-03-26 Referring Provider (Robin Malone): Ward Givens, MD   Encounter Date: 08/05/2021   Robin Malone End of Session - 08/05/21 1109     Visit Number 8    Number of Visits 12    Date for Robin Malone Re-Evaluation 08/19/21    Authorization Type humana- requested 12 visits    Authorization - Visit Number 8    Authorization - Number of Visits 12    Progress Note Due on Visit 10    Robin Malone Start Time 1107    Robin Malone Stop Time 1147    Robin Malone Time Calculation (min) 40 min    Equipment Utilized During Treatment Gait belt    Activity Tolerance Patient tolerated treatment well    Behavior During Therapy [facility] for tasks assessed/performed             No past medical history on file.  Past Surgical History:  Procedure Laterality Date   BACK SURGERY     Capsulotomy MPJ Right 10/01/2016   RT #2 Toe   Hammer Toe Repair Right 10/01/2016   RT #2 Toe    There were no vitals filed for this visit.   Subjective Assessment - 08/05/21 1108     Subjective The patient reports she was sore after last session in her low back.  She is no longer having neck pain.    Pertinent History Neck surgery in the 90s, back surgery inthe 90s, hypothyroidism    Patient Stated Goals help strengthen my legs, reduce pain in back and neck    Currently in Pain? No/denies                [facility] Robin Malone Assessment - 08/05/21 1111       Assessment   Medical Diagnosis muscle weakness    Referring Provider (Robin Malone) Ward Givens, MD    Onset Date/Surgical Date 07/02/21    Hand Dominance Left                           [facility] Adult Robin [facility]one Treatment/Exercise - 08/05/21 1111       Ambulation/Gait   Ambulation/Gait Yes    Ambulation/Gait Assistance 6: Modified  independent (Device/Increase time)    Ambulation Distance (Feet) 200 Feet    Assistive device None    Ambulation Surface Level;Indoor    Gait Comments The patient and Robin Malone worked on arm swing and heel strike.      Therapeutic Activites    Therapeutic Activities Other Therapeutic Activities    Other Therapeutic Activities floor<>stand initiated holding surface and moving to 1/2 kneel<>return to standing      Neuro Re-ed    Neuro Re-ed Details  cone tapping, wide ot narrow through agility ladder with handheld assist      Exercises   Exercises Lumbar      Lumbar Exercises: Stretches   Active Hamstring Stretch Right;Left;1 rep;60 seconds      Lumbar Exercises: Aerobic   Nustep level 5 x 4 minutes LEs only      Lumbar Exercises: Standing   Other Standing Lumbar Exercises step ups R and L x 10 reps    Other Standing Lumbar Exercises cone tapping to 10 reps R and L with dec'ing UE support  Lumbar Exercises: Supine   Bent Knee Raise 10 reps    Bridge 10 reps      Lumbar Exercises: Sidelying   Clam Right;Left;10 reps    Hip Abduction Right;Left;10 reps    Hip Abduction Weights (lbs) needed tactile assist for tecvhnique-- weaker on the R side    Other Sidelying Lumbar Exercises hip hike/depression x 10 reps with ball under distal LE                 Balance Exercises - 08/05/21 1118       Balance Exercises: Standing   Retro Gait 5 reps    Sidestepping 5 reps    Marching 10 reps    Heel Raises 10 reps    Toe Raise 10 reps   walking near counter                   Robin Malone Long Term Goals - 07/24/21 1245       Robin Malone LONG TERM GOAL #1   Title The patient will be indep with HEP.    Time 6    Period Weeks    Status On-going      Robin Malone LONG TERM GOAL #2   Title The patient will report neck pain < or equal to 2/10.    Baseline 8/10 today at eval, 0/10 today    Time 6    Period Weeks    Status Achieved      Robin Malone LONG TERM GOAL #3   Title The patient will  demonstrate moving floor<>stand using chair/couch/firm surface for UE support mod indep.    Time 6    Period Weeks    Status On-going      Robin Malone LONG TERM GOAL #4   Title The patient will improve Berg balance score from 45/56 to > or equal to 50/56 demonstrating dec'd risk for falls.    Time 6    Period Weeks    Status On-going      Robin Malone LONG TERM GOAL #5   Title The patient will be further assessed on gait speed and demo improvement of 0.5 ft/sec.    Time 6    Period Weeks    Status On-going                   Plan - 08/05/21 1248     Clinical Impression Statement The patient has met goal for neck.  She continues to work on Cytogeneticist for balance/mobility.  Robin Malone to continue to work to [employer].    Robin Malone Treatment/Interventions Gait training;Stair training;ADLs/Self Care Home Management;Patient/family education;Therapeutic activities;Therapeutic exercise;Balance training;Neuromuscular re-education;Manual techniques;Dry needling;Taping;Aquatic Therapy;Electrical Stimulation;Moist Heat    Robin Malone Next Visit Plan continue balance/gait and LE strengthening.  Work towards transitions to/from floor for LTG#3  Progress to LTGs.    Robin Malone Home Exercise Plan Access Code: [email or web address]    Consulted and Agree with Plan of Care Patient             Patient will benefit from skilled therapeutic intervention in order to improve the following deficits and impairments:     Visit Diagnosis: Muscle weakness (generalized)  Chronic bilateral low back pain without sciatica  Cervicalgia  Other abnormalities of gait and mobility  PHYSICAL THERAPY DISCHARGE SUMMARY  Visits from Start of Care: 8  Current functional level related to goals / functional outcomes: See above-- patient had met goal for neck.   *She called to cancel remaining visits due to increased low back pain  Remaining deficits: See above for last known patient status.   Education / Equipment: HEP   Patient agrees to  discharge. Patient goals were partially met. Patient is being discharged due to not returning since the last visit.    Problem List Patient Active Problem List   Diagnosis Date Noted   Greater trochanteric bursitis of right hip 05/16/2019   Impingement syndrome of left shoulder 12/29/2017   Laceration of leg, left 06/24/2016   Primary osteoarthritis of left hip 05/25/2016   Toe pain, right 10/07/2015   Left cervical radiculopathy 03/27/2015   Spondylolisthesis at L4-L5 level 02/17/2015   Trochanteric bursitis of left hip 02/22/2014   Osteoarthritis of right knee 01/25/2014   Robin Malone, Robin Malone  Robin Malone 08/05/2021, 12:50 PM  [facility] [place], [place], [address] Phone: [phone]   Fax:  [phone]  Name: Robin Malone MRN: [id number] Date of Birth: [birth year]

## 2021-08-07 ENCOUNTER — Encounter: Payer: Medicare HMO | Admitting: Rehabilitative and Restorative Service Providers"

## 2021-08-10 ENCOUNTER — Telehealth: Payer: Self-pay | Admitting: Rehabilitative and Restorative Service Providers"

## 2021-08-10 ENCOUNTER — Encounter: Payer: Medicare HMO | Admitting: Physical Therapy

## 2021-08-10 NOTE — Telephone Encounter (Signed)
Pt had left a message for PT clinic to cancel remaining visits due to increased low back pain with therapy.   No answer, therefore, left a voicemail for patient.  Margretta Ditty, MPT

## 2021-08-12 ENCOUNTER — Encounter: Payer: Medicare HMO | Admitting: Physical Therapy

## 2023-02-11 ENCOUNTER — Inpatient Hospital Stay (HOSPITAL_COMMUNITY)
Admission: EM | Admit: 2023-02-11 | Discharge: 2023-02-16 | DRG: 552 | Disposition: A | Discharge status: Skilled Nursing Facility | Payer: Medicare HMO | Attending: Internal Medicine | Admitting: Internal Medicine

## 2023-02-11 ENCOUNTER — Other Ambulatory Visit: Payer: Self-pay

## 2023-02-11 ENCOUNTER — Encounter (HOSPITAL_COMMUNITY): Payer: Self-pay

## 2023-02-11 DIAGNOSIS — I4891 Unspecified atrial fibrillation: Secondary | ICD-10-CM | POA: Diagnosis not present

## 2023-02-11 DIAGNOSIS — M48061 Spinal stenosis, lumbar region without neurogenic claudication: Secondary | ICD-10-CM | POA: Diagnosis present

## 2023-02-11 DIAGNOSIS — Z87891 Personal history of nicotine dependence: Secondary | ICD-10-CM

## 2023-02-11 DIAGNOSIS — K219 Gastro-esophageal reflux disease without esophagitis: Secondary | ICD-10-CM | POA: Diagnosis present

## 2023-02-11 DIAGNOSIS — M4316 Spondylolisthesis, lumbar region: Secondary | ICD-10-CM | POA: Diagnosis present

## 2023-02-11 DIAGNOSIS — E039 Hypothyroidism, unspecified: Secondary | ICD-10-CM | POA: Diagnosis present

## 2023-02-11 DIAGNOSIS — Z881 Allergy status to other antibiotic agents status: Secondary | ICD-10-CM

## 2023-02-11 DIAGNOSIS — Z79890 Hormone replacement therapy (postmenopausal): Secondary | ICD-10-CM

## 2023-02-11 DIAGNOSIS — G8929 Other chronic pain: Secondary | ICD-10-CM | POA: Diagnosis present

## 2023-02-11 DIAGNOSIS — X58XXXA Exposure to other specified factors, initial encounter: Secondary | ICD-10-CM | POA: Diagnosis present

## 2023-02-11 DIAGNOSIS — S39012A Strain of muscle, fascia and tendon of lower back, initial encounter: Principal | ICD-10-CM

## 2023-02-11 DIAGNOSIS — N3941 Urge incontinence: Secondary | ICD-10-CM | POA: Diagnosis present

## 2023-02-11 DIAGNOSIS — M5144 Schmorl's nodes, thoracic region: Principal | ICD-10-CM | POA: Diagnosis present

## 2023-02-11 DIAGNOSIS — M62838 Other muscle spasm: Secondary | ICD-10-CM | POA: Diagnosis present

## 2023-02-11 DIAGNOSIS — R52 Pain, unspecified: Secondary | ICD-10-CM | POA: Diagnosis present

## 2023-02-11 DIAGNOSIS — E782 Mixed hyperlipidemia: Secondary | ICD-10-CM | POA: Insufficient documentation

## 2023-02-11 DIAGNOSIS — Z66 Do not resuscitate: Secondary | ICD-10-CM | POA: Diagnosis present

## 2023-02-11 DIAGNOSIS — E876 Hypokalemia: Secondary | ICD-10-CM | POA: Diagnosis present

## 2023-02-11 DIAGNOSIS — R5383 Other fatigue: Secondary | ICD-10-CM | POA: Diagnosis present

## 2023-02-11 DIAGNOSIS — I10 Essential (primary) hypertension: Secondary | ICD-10-CM | POA: Diagnosis present

## 2023-02-11 DIAGNOSIS — F1010 Alcohol abuse, uncomplicated: Secondary | ICD-10-CM | POA: Diagnosis present

## 2023-02-11 DIAGNOSIS — G4709 Other insomnia: Secondary | ICD-10-CM | POA: Diagnosis present

## 2023-02-11 DIAGNOSIS — Z888 Allergy status to other drugs, medicaments and biological substances status: Secondary | ICD-10-CM

## 2023-02-11 DIAGNOSIS — Z1152 Encounter for screening for COVID-19: Secondary | ICD-10-CM

## 2023-02-11 DIAGNOSIS — Z981 Arthrodesis status: Secondary | ICD-10-CM

## 2023-02-11 DIAGNOSIS — R300 Dysuria: Secondary | ICD-10-CM | POA: Diagnosis present

## 2023-02-11 DIAGNOSIS — Z79899 Other long term (current) drug therapy: Secondary | ICD-10-CM

## 2023-02-11 DIAGNOSIS — Z885 Allergy status to narcotic agent status: Secondary | ICD-10-CM

## 2023-02-11 HISTORY — DX: Other chronic pain: G89.29

## 2023-02-11 HISTORY — DX: Disorder of thyroid, unspecified: E07.9

## 2023-02-11 HISTORY — DX: Essential (primary) hypertension: I10

## 2023-02-11 HISTORY — DX: Pure hypercholesterolemia, unspecified: E78.00

## 2023-02-11 HISTORY — DX: Gastro-esophageal reflux disease without esophagitis: K21.9

## 2023-02-11 MED ORDER — ONDANSETRON HCL 4 MG/2ML IJ SOLN
4.0000 mg | Freq: Once | INTRAMUSCULAR | Status: AC
  Administered 2023-02-12: 4 mg via INTRAVENOUS
  Filled 2023-02-11: qty 2

## 2023-02-11 MED ORDER — HYDROMORPHONE HCL 1 MG/ML IJ SOLN
0.5000 mg | Freq: Once | INTRAMUSCULAR | Status: AC
  Administered 2023-02-12: 0.5 mg via INTRAVENOUS
  Filled 2023-02-11: qty 0.5

## 2023-02-11 NOTE — ED Triage Notes (Signed)
Pt arrived from home via [facility] reporting primarily lumbar back pain X 4 days after reporting her recent dental appointment exacerbated her back pain. Pt reports OTC medications and ETOH have not helped relieve her pain. Pt does endorse previous back surgeries.

## 2023-02-11 NOTE — ED Provider Notes (Signed)
[place]ENC[facility] DE[facility]MENT AT [facility] Provider Note   CSN: [id number] Arrival date & time: 02/11/23  2329     History  Chief Complaint  Patient presents with   Back Pain    Robin Malone is a 89 y.o. female.  Patient presents to the emergency department by ambulance from home.  Patient complaining of low back pain.  Patient reports a history of chronic back pain, has had multiple lumbar surgeries.  This week she started to notice increased pain.  Pain started after she sat for an extended period of time in the dental chair.  Since then she has noticed difficulty getting out of bed because movements make the pain worse.  She has intermittent sharp stabbing spasms of pain.  Pain does not radiate to the lower legs.  No numbness, tingling, weakness of extremities.  No change in bowel or bladder function.       Home Medications Prior to Admission medications   Medication Sig Start Date End Date Taking? Authorizing Provider  diazepam (VALIUM) 5 MG tablet Take 1 tab PO 1 hour before procedure or imaging. Patient not taking: Reported on 07/08/2021 06/07/17   Monica Becton, MD  fluticasone [facility]) 50 MCG/ACT nasal spray Place into the nose.    [provider]  gabapentin (NEURONTIN) 600 MG tablet TAKE 1 TABLET AT BEDTIME. 02/21/17   Monica Becton, MD  levothyroxine (SYNTHROID, LEVOTHROID) 75 MCG tablet Take by mouth. 06/16/17   [provider]  lisinopril-hydrochlorothiazide (PRINZIDE,ZESTORETIC) 20-12.5 MG per tablet Take 1 tablet by mouth daily.    [provider]  lovastatin (MEVACOR) 40 MG tablet Take 40 mg by mouth at bedtime. Taking 2 at bedtime.    [provider]  montelukast (SINGULAIR) 10 MG tablet  02/17/15   [provider]  mupirocin ointment (BACTROBAN) 2 %  12/27/14   [provider]  ondansetron (ZOFRAN) 4 MG tablet Take 1 tablet (4 mg total) by mouth every 8 (eight) hours as needed for  nausea or vomiting. Patient not taking: Reported on 07/08/2021 02/04/18   Elson Areas, PA-C      Allergies    Codeine, Hydrocodone-acetaminophen, Levofloxacin, Tramadol, and Doxycycline    Review of Systems   Review of Systems  Physical Exam Updated Vital Signs BP (!) 149/78   Pulse 96   Temp 98.6 F (37 C) (Oral)   Resp 12   Ht 5\' 3"  (1.6 m)   Wt 72.1 kg   SpO2 96%   BMI 28.17 kg/m  Physical Exam Vitals and nursing note reviewed.  Constitutional:      General: She is not in acute distress.    Appearance: She is well-developed.  HENT:     Head: Normocephalic and atraumatic.     Mouth/Throat:     Mouth: Mucous membranes are moist.  Eyes:     General: Vision grossly intact. Gaze aligned appropriately.     Extraocular Movements: Extraocular movements intact.     Conjunctiva/sclera: Conjunctivae normal.  Cardiovascular:     Rate and Rhythm: Normal rate and regular rhythm.     Pulses: Normal pulses.     Heart sounds: Normal heart sounds, S1 normal and S2 normal. No murmur heard.    No friction rub. No gallop.  Pulmonary:     Effort: Pulmonary effort is normal. No respiratory distress.     Breath sounds: Normal breath sounds.  Abdominal:     General: Bowel sounds are normal.  Palpations: Abdomen is soft.     Tenderness: There is no abdominal tenderness. There is no guarding or rebound.     Hernia: No hernia is present.  Musculoskeletal:        General: No swelling.     Cervical back: Full passive range of motion without pain, normal range of motion and neck supple. No spinous process tenderness or muscular tenderness. Normal range of motion.     Right lower leg: No edema.     Left lower leg: No edema.  Skin:    General: Skin is warm and dry.     Capillary Refill: Capillary refill takes less than 2 seconds.     Findings: No ecchymosis, erythema, rash or wound.  Neurological:     General: No focal deficit present.     Mental Status: She is alert and oriented to  person, place, and time.     GCS: GCS eye subscore is 4. GCS verbal subscore is 5. GCS motor subscore is 6.     Cranial Nerves: Cranial nerves 2-12 are intact.     Sensory: Sensation is intact.     Motor: Motor function is intact.     Coordination: Coordination is intact.     Deep Tendon Reflexes:     Reflex Scores:      Patellar reflexes are 2+ on the right side and 2+ on the left side.    Comments: Normal strength in lower extremities, no foot drop, no saddle anesthesia  Psychiatric:        Attention and Perception: Attention normal.        Mood and Affect: Mood normal.        Speech: Speech normal.        Behavior: Behavior normal.     ED Results / Procedures / Treatments   Labs (all labs ordered are listed, but only abnormal results are displayed) Labs Reviewed  BASIC METABOLIC PANEL - Abnormal; Notable for the following components:      Result Value   Potassium 3.0 (*)    All other components within normal limits  CBC - Abnormal; Notable for the following components:   RBC 3.05 (*)    Hemoglobin 11.0 (*)    HCT 34.3 (*)    MCV 112.5 (*)    MCH 36.1 (*)    All other components within normal limits  TROPONIN I (HIGH SENSITIVITY)  TROPONIN I (HIGH SENSITIVITY)    EKG EKG Interpretation  Date/Time:  Saturday February 12 2023 02:18:37 EST Ventricular Rate:  156 PR Interval:    QRS Duration: 85 QT Interval:  269 QTC Calculation: 434 R Axis:   48 Text Interpretation: Atrial fibrillation with rapid V-rate Ventricular premature complex Low voltage, precordial leads Repolarization abnormality, prob rate related Confirmed by Gilda Crease [phone]) on 02/12/2023 2:54:41 AM   EKG Interpretation  Date/Time:  Saturday February 12 2023 03:41:14 EST Ventricular Rate:  95 PR Interval:  173 QRS Duration: 103 QT Interval:  397 QTC Calculation: 500 R Axis:   53 Text Interpretation: Sinus rhythm Low voltage, precordial leads Minimal ST depression, diffuse leads Borderline  prolonged QT interval Confirmed by Gilda Crease [phone]) on 02/12/2023 3:50:06 AM        Radiology CT Lumbar Spine Wo Contrast  Result Date: 02/12/2023 CLINICAL DATA:  Low back pain x4 days EXAM: CT LUMBAR SPINE WITHOUT CONTRAST TECHNIQUE: Multidetector CT imaging of the lumbar spine was performed without intravenous contrast administration. Multiplanar CT image reconstructions were also  generated. RADIATION DOSE REDUCTION: This exam was performed according to the departmental dose-optimization program which includes automated exposure control, adjustment of the mA and/or kV according to patient size and/or use of iterative reconstruction technique. COMPARISON:  MRI lumbar spine dated 01/30/2021 FINDINGS: Segmentation: 5 lumbar type vertebral bodies. Alignment: Grade 2 spondylolisthesis at L4-5, chronic. Vertebrae: No acute fracture or focal pathologic process. Paraspinal and other soft tissues: Atherosclerotic calcifications the abdominal aorta and branch vessels. 10 mm hyperdense/hemorrhagic left lower pole renal cyst (series 4/image 39), benign (Bosniak II). No focal is recommended. Disc levels: Moderate degenerative changes, most prominent at L2-3. Spinal canal is mildly narrowed but patent. IMPRESSION: No acute fracture or dislocation is seen. Grade 2 spondylolisthesis at L4-5, chronic. Moderate degenerative changes, most prominent at L2-3. Spinal canal is mildly narrowed but patent. Electronically Signed   By: Charline Bills M.D.   On: 02/12/2023 00:38    Procedures Procedures    Medications Ordered in ED Medications  diltiazem (CARDIZEM) 1 mg/mL load via infusion 5 mg (has no administration in time range)    And  diltiazem (CARDIZEM) 125 mg in dextrose 5% 125 mL (1 mg/mL) infusion (has no administration in time range)  HYDROmorphone (DILAUDID) injection 0.5 mg (0.5 mg Intravenous Given 02/12/23 0006)  ondansetron (ZOFRAN) injection 4 mg (4 mg Intravenous Given 02/12/23 0006)   diazepam (VALIUM) injection 2.5 mg (2.5 mg Intravenous Given 02/12/23 0202)  ketorolac (TORADOL) 30 MG/ML injection 15 mg (15 mg Intravenous Given 02/12/23 0202)  metoCLOPramide (REGLAN) injection 5 mg (5 mg Intravenous Given 02/12/23 0213)  lactated ringers bolus 1,000 mL (0 mLs Intravenous Stopped 02/12/23 0332)  metoprolol tartrate (LOPRESSOR) tablet 25 mg (25 mg Oral Given 02/12/23 0346)    ED Course/ Medical Decision Making/ A&P         CHA2DS2-VASc Score: 5                    Medical Decision Making Amount and/or Complexity of Data Reviewed Labs: ordered. Radiology: ordered.  Risk Prescription drug management.   Differential Diagnosis considered includes, but not limited to: Muscle strain; acute disc herniation; lumbar fracture; cauda equina syndrome; infection; malignancy; AAA  Presents to the emergency department for evaluation of 4 days of progressively worsening low back pain.  She has a history of chronic low back pain with prior surgeries.  Patient indicates that minimal movements of her lower back worsen the pain.  This does seem very musculoskeletal in nature.  She does not have any radicular pain.  Lower extremity strength, sensation, reflexes are normal.  Perform CT lumbar spine because of her increased risk of occult compression fracture.  She does not have any fractures noted.  Patient treated with Dilaudid initially.  This did not seem to help her pain at all.  She was then given low-dose Toradol and Valium because she was having intermittent severe spasms while lying in bed.  Around this time, patient's heart rate jumped up.  She was found to be in atrial fibrillation with rapid ventricular response.  I do not see a prior history of this in her records.  She was given a fluid bolus.  Heart rate remained elevated.  While arranging to initiate Cardizem bolus and drip, however, she converted back to sinus rhythm.  She was given oral Lopressor to help prevent recurrence.  Patient  will be admitted for further cardiac monitoring and treatment of her back pain.         Final Clinical Impression(s) /  ED Diagnoses Final diagnoses:  Strain of lumbar region, initial encounter  Atrial fibrillation with R[facility])    Rx / DC Orders ED Discharge Orders     None         Harini Dearmond, Canary Brim, MD 02/12/23 [phone]

## 2023-02-12 ENCOUNTER — Emergency Department (HOSPITAL_COMMUNITY): Payer: Medicare HMO

## 2023-02-12 ENCOUNTER — Other Ambulatory Visit: Payer: Self-pay

## 2023-02-12 ENCOUNTER — Other Ambulatory Visit (HOSPITAL_COMMUNITY): Payer: Self-pay | Admitting: *Deleted

## 2023-02-12 ENCOUNTER — Observation Stay (HOSPITAL_COMMUNITY): Payer: Medicare HMO

## 2023-02-12 DIAGNOSIS — I4891 Unspecified atrial fibrillation: Secondary | ICD-10-CM | POA: Diagnosis not present

## 2023-02-12 DIAGNOSIS — Z888 Allergy status to other drugs, medicaments and biological substances status: Secondary | ICD-10-CM | POA: Diagnosis not present

## 2023-02-12 DIAGNOSIS — Z87891 Personal history of nicotine dependence: Secondary | ICD-10-CM | POA: Diagnosis not present

## 2023-02-12 DIAGNOSIS — Z981 Arthrodesis status: Secondary | ICD-10-CM | POA: Diagnosis not present

## 2023-02-12 DIAGNOSIS — E782 Mixed hyperlipidemia: Secondary | ICD-10-CM | POA: Diagnosis present

## 2023-02-12 DIAGNOSIS — Z79890 Hormone replacement therapy (postmenopausal): Secondary | ICD-10-CM | POA: Diagnosis not present

## 2023-02-12 DIAGNOSIS — M48061 Spinal stenosis, lumbar region without neurogenic claudication: Secondary | ICD-10-CM | POA: Diagnosis present

## 2023-02-12 DIAGNOSIS — M4316 Spondylolisthesis, lumbar region: Secondary | ICD-10-CM

## 2023-02-12 DIAGNOSIS — I10 Essential (primary) hypertension: Secondary | ICD-10-CM | POA: Insufficient documentation

## 2023-02-12 DIAGNOSIS — G4709 Other insomnia: Secondary | ICD-10-CM | POA: Diagnosis present

## 2023-02-12 DIAGNOSIS — Z881 Allergy status to other antibiotic agents status: Secondary | ICD-10-CM | POA: Diagnosis not present

## 2023-02-12 DIAGNOSIS — Z1152 Encounter for screening for COVID-19: Secondary | ICD-10-CM | POA: Diagnosis not present

## 2023-02-12 DIAGNOSIS — X58XXXA Exposure to other specified factors, initial encounter: Secondary | ICD-10-CM | POA: Diagnosis present

## 2023-02-12 DIAGNOSIS — Z885 Allergy status to narcotic agent status: Secondary | ICD-10-CM | POA: Diagnosis not present

## 2023-02-12 DIAGNOSIS — F1010 Alcohol abuse, uncomplicated: Secondary | ICD-10-CM | POA: Diagnosis present

## 2023-02-12 DIAGNOSIS — R52 Pain, unspecified: Secondary | ICD-10-CM | POA: Diagnosis not present

## 2023-02-12 DIAGNOSIS — E876 Hypokalemia: Secondary | ICD-10-CM | POA: Diagnosis not present

## 2023-02-12 DIAGNOSIS — G8929 Other chronic pain: Secondary | ICD-10-CM | POA: Diagnosis present

## 2023-02-12 DIAGNOSIS — M5144 Schmorl's nodes, thoracic region: Secondary | ICD-10-CM | POA: Diagnosis present

## 2023-02-12 DIAGNOSIS — K219 Gastro-esophageal reflux disease without esophagitis: Secondary | ICD-10-CM | POA: Diagnosis present

## 2023-02-12 DIAGNOSIS — E039 Hypothyroidism, unspecified: Secondary | ICD-10-CM | POA: Diagnosis present

## 2023-02-12 DIAGNOSIS — Z66 Do not resuscitate: Secondary | ICD-10-CM | POA: Diagnosis present

## 2023-02-12 DIAGNOSIS — Z79899 Other long term (current) drug therapy: Secondary | ICD-10-CM | POA: Diagnosis not present

## 2023-02-12 DIAGNOSIS — R300 Dysuria: Secondary | ICD-10-CM | POA: Diagnosis present

## 2023-02-12 DIAGNOSIS — M62838 Other muscle spasm: Secondary | ICD-10-CM | POA: Diagnosis present

## 2023-02-12 DIAGNOSIS — N3941 Urge incontinence: Secondary | ICD-10-CM | POA: Diagnosis present

## 2023-02-12 LAB — CBC
HCT: 34.3 % — ABNORMAL LOW (ref 36.0–46.0)
Hemoglobin: 11 g/dL — ABNORMAL LOW (ref 12.0–15.0)
MCH: 36.1 pg — ABNORMAL HIGH (ref 26.0–34.0)
MCHC: 32.1 g/dL (ref 30.0–36.0)
MCV: 112.5 fL — ABNORMAL HIGH (ref 80.0–100.0)
Platelets: 189 10*3/uL (ref 150–400)
RBC: 3.05 MIL/uL — ABNORMAL LOW (ref 3.87–5.11)
RDW: 15 % (ref 11.5–15.5)
WBC: 8.6 10*3/uL (ref 4.0–10.5)
nRBC: 0 % (ref 0.0–0.2)

## 2023-02-12 LAB — CBC WITH DIFFERENTIAL/PLATELET
Abs Immature Granulocytes: 0.06 10*3/uL (ref 0.00–0.07)
Basophils Absolute: 0 10*3/uL (ref 0.0–0.1)
Basophils Relative: 0 %
Eosinophils Absolute: 0.1 10*3/uL (ref 0.0–0.5)
Eosinophils Relative: 1 %
HCT: 34.7 % — ABNORMAL LOW (ref 36.0–46.0)
Hemoglobin: 10.9 g/dL — ABNORMAL LOW (ref 12.0–15.0)
Immature Granulocytes: 1 %
Lymphocytes Relative: 20 %
Lymphs Abs: 1.8 10*3/uL (ref 0.7–4.0)
MCH: 36.3 pg — ABNORMAL HIGH (ref 26.0–34.0)
MCHC: 31.4 g/dL (ref 30.0–36.0)
MCV: 115.7 fL — ABNORMAL HIGH (ref 80.0–100.0)
Monocytes Absolute: 0.7 10*3/uL (ref 0.1–1.0)
Monocytes Relative: 8 %
Neutro Abs: 6.3 10*3/uL (ref 1.7–7.7)
Neutrophils Relative %: 70 %
Platelets: 184 10*3/uL (ref 150–400)
RBC: 3 MIL/uL — ABNORMAL LOW (ref 3.87–5.11)
RDW: 15.2 % (ref 11.5–15.5)
WBC: 8.9 10*3/uL (ref 4.0–10.5)
nRBC: 0 % (ref 0.0–0.2)

## 2023-02-12 LAB — ECHOCARDIOGRAM COMPLETE
Area-P 1/2: 4.89 cm2
Est EF: 55
Height: 63 in
S' Lateral: 2.8 cm
Weight: 2544 oz

## 2023-02-12 LAB — URINALYSIS, ROUTINE W REFLEX MICROSCOPIC
Bilirubin Urine: NEGATIVE
Glucose, UA: NEGATIVE mg/dL
Hgb urine dipstick: NEGATIVE
Ketones, ur: NEGATIVE mg/dL
Nitrite: NEGATIVE
Protein, ur: NEGATIVE mg/dL
Specific Gravity, Urine: 1.01 (ref 1.005–1.030)
pH: 6 (ref 5.0–8.0)

## 2023-02-12 LAB — COMPREHENSIVE METABOLIC PANEL
ALT: 13 U/L (ref 0–44)
AST: 21 U/L (ref 15–41)
Albumin: 3.6 g/dL (ref 3.5–5.0)
Alkaline Phosphatase: 57 U/L (ref 38–126)
Anion gap: 11 (ref 5–15)
BUN: 13 mg/dL (ref 8–23)
CO2: 23 mmol/L (ref 22–32)
Calcium: 8.5 mg/dL — ABNORMAL LOW (ref 8.9–10.3)
Chloride: 104 mmol/L (ref 98–111)
Creatinine, Ser: 0.57 mg/dL (ref 0.44–1.00)
GFR, Estimated: >60 mL/min (ref >60)
Glucose, Bld: 98 mg/dL (ref 70–99)
Potassium: 3.3 mmol/L — ABNORMAL LOW (ref 3.5–5.1)
Sodium: 138 mmol/L (ref 135–145)
Total Bilirubin: 0.7 mg/dL (ref 0.3–1.2)
Total Protein: 6.7 g/dL (ref 6.5–8.1)

## 2023-02-12 LAB — BASIC METABOLIC PANEL
Anion gap: 12 (ref 5–15)
BUN: 13 mg/dL (ref 8–23)
CO2: 25 mmol/L (ref 22–32)
Calcium: 9.2 mg/dL (ref 8.9–10.3)
Chloride: 100 mmol/L (ref 98–111)
Creatinine, Ser: 0.69 mg/dL (ref 0.44–1.00)
GFR, Estimated: >60 mL/min (ref >60)
Glucose, Bld: 93 mg/dL (ref 70–99)
Potassium: 3 mmol/L — ABNORMAL LOW (ref 3.5–5.1)
Sodium: 137 mmol/L (ref 135–145)

## 2023-02-12 LAB — TROPONIN I (HIGH SENSITIVITY)
Troponin I (High Sensitivity): 5 ng/L (ref <18)
Troponin I (High Sensitivity): 6 ng/L (ref <18)

## 2023-02-12 LAB — MAGNESIUM: Magnesium: 2.1 mg/dL (ref 1.7–2.4)

## 2023-02-12 LAB — TSH: TSH: 3.28 u[IU]/mL (ref 0.350–4.500)

## 2023-02-12 MED ORDER — POTASSIUM CHLORIDE CRYS ER 20 MEQ PO TBCR
40.0000 meq | EXTENDED_RELEASE_TABLET | Freq: Two times a day (BID) | ORAL | Status: AC
  Administered 2023-02-12 (×2): 40 meq via ORAL
  Filled 2023-02-12: qty 2

## 2023-02-12 MED ORDER — METHOCARBAMOL 500 MG PO TABS
500.0000 mg | ORAL_TABLET | Freq: Three times a day (TID) | ORAL | Status: DC
  Administered 2023-02-12 – 2023-02-15 (×8): 500 mg via ORAL
  Filled 2023-02-12 (×8): qty 1

## 2023-02-12 MED ORDER — FOSFOMYCIN TROMETHAMINE 3 G PO PACK
3.0000 g | PACK | Freq: Once | ORAL | Status: AC
  Administered 2023-02-12: 3 g via ORAL
  Filled 2023-02-12: qty 3

## 2023-02-12 MED ORDER — OXYCODONE HCL 5 MG PO TABS
5.0000 mg | ORAL_TABLET | ORAL | Status: DC | PRN
  Administered 2023-02-12 – 2023-02-15 (×8): 5 mg via ORAL
  Filled 2023-02-12 (×9): qty 1

## 2023-02-12 MED ORDER — DILTIAZEM LOAD VIA INFUSION
5.0000 mg | Freq: Once | INTRAVENOUS | Status: DC
  Filled 2023-02-12: qty 5

## 2023-02-12 MED ORDER — ACETAMINOPHEN 650 MG RE SUPP: 650.0000 mg | Freq: Four times a day (QID) | RECTAL | Status: DC | PRN

## 2023-02-12 MED ORDER — POTASSIUM CHLORIDE CRYS ER 20 MEQ PO TBCR: 40.0000 meq | EXTENDED_RELEASE_TABLET | Freq: Two times a day (BID) | ORAL | Status: DC

## 2023-02-12 MED ORDER — LORAZEPAM 1 MG PO TABS
1.0000 mg | ORAL_TABLET | ORAL | Status: AC | PRN
  Administered 2023-02-13: 1 mg via ORAL
  Filled 2023-02-12: qty 1

## 2023-02-12 MED ORDER — DIAZEPAM 5 MG/ML IJ SOLN
2.5000 mg | Freq: Once | INTRAMUSCULAR | Status: AC
  Administered 2023-02-12: 2.5 mg via INTRAVENOUS
  Filled 2023-02-12: qty 2

## 2023-02-12 MED ORDER — LEVOTHYROXINE SODIUM 75 MCG PO TABS
75.0000 ug | ORAL_TABLET | Freq: Every day | ORAL | Status: DC
  Administered 2023-02-12 – 2023-02-16 (×5): 75 ug via ORAL
  Filled 2023-02-12 (×2): qty 1
  Filled 2023-02-12: qty 2
  Filled 2023-02-12 (×2): qty 1

## 2023-02-12 MED ORDER — METHOCARBAMOL 500 MG PO TABS
500.0000 mg | ORAL_TABLET | Freq: Four times a day (QID) | ORAL | Status: DC
  Administered 2023-02-12: 500 mg via ORAL
  Filled 2023-02-12: qty 1

## 2023-02-12 MED ORDER — MORPHINE SULFATE (PF) 2 MG/ML IV SOLN: 2.0000 mg | INTRAVENOUS | Status: DC | PRN

## 2023-02-12 MED ORDER — LACTATED RINGERS IV BOLUS
1000.0000 mL | Freq: Once | INTRAVENOUS | Status: AC
  Administered 2023-02-12: 1000 mL via INTRAVENOUS

## 2023-02-12 MED ORDER — KETOROLAC TROMETHAMINE 30 MG/ML IJ SOLN
15.0000 mg | Freq: Once | INTRAMUSCULAR | Status: AC
  Administered 2023-02-12: 15 mg via INTRAVENOUS
  Filled 2023-02-12: qty 1

## 2023-02-12 MED ORDER — METOCLOPRAMIDE HCL 5 MG/ML IJ SOLN
5.0000 mg | Freq: Once | INTRAMUSCULAR | Status: AC
  Administered 2023-02-12: 5 mg via INTRAVENOUS
  Filled 2023-02-12: qty 2

## 2023-02-12 MED ORDER — DILTIAZEM HCL-DEXTROSE 125-5 MG/125ML-% IV SOLN (PREMIX): 5.0000 mg/h | INTRAVENOUS | Status: DC

## 2023-02-12 MED ORDER — THIAMINE MONONITRATE 100 MG PO TABS
100.0000 mg | ORAL_TABLET | Freq: Every day | ORAL | Status: DC
  Administered 2023-02-12 – 2023-02-16 (×5): 100 mg via ORAL
  Filled 2023-02-12 (×5): qty 1

## 2023-02-12 MED ORDER — METOPROLOL TARTRATE 25 MG PO TABS
25.0000 mg | ORAL_TABLET | Freq: Two times a day (BID) | ORAL | Status: DC
  Administered 2023-02-12 – 2023-02-16 (×9): 25 mg via ORAL
  Filled 2023-02-12 (×9): qty 1

## 2023-02-12 MED ORDER — PHENAZOPYRIDINE HCL 100 MG PO TABS: 100.0000 mg | ORAL_TABLET | Freq: Three times a day (TID) | ORAL | Status: DC

## 2023-02-12 MED ORDER — POTASSIUM CHLORIDE 20 MEQ PO PACK
40.0000 meq | PACK | Freq: Once | ORAL | Status: AC
  Administered 2023-02-12: 40 meq via ORAL
  Filled 2023-02-12: qty 2

## 2023-02-12 MED ORDER — ENOXAPARIN SODIUM 80 MG/0.8ML IJ SOSY
1.0000 mg/kg | PREFILLED_SYRINGE | Freq: Two times a day (BID) | INTRAMUSCULAR | Status: DC
  Administered 2023-02-12 – 2023-02-16 (×9): 72.5 mg via SUBCUTANEOUS
  Filled 2023-02-12 (×9): qty 0.8

## 2023-02-12 MED ORDER — PRAVASTATIN SODIUM 40 MG PO TABS
40.0000 mg | ORAL_TABLET | Freq: Every day | ORAL | Status: DC
  Administered 2023-02-12 – 2023-02-15 (×4): 40 mg via ORAL
  Filled 2023-02-12 (×4): qty 1

## 2023-02-12 MED ORDER — GABAPENTIN 600 MG PO TABS: 600.0000 mg | ORAL_TABLET | Freq: Every day | ORAL | Status: DC

## 2023-02-12 MED ORDER — LISINOPRIL-HYDROCHLOROTHIAZIDE 20-12.5 MG PO TABS: 1.0000 | ORAL_TABLET | Freq: Every day | ORAL | Status: DC

## 2023-02-12 MED ORDER — FOLIC ACID 1 MG PO TABS
1.0000 mg | ORAL_TABLET | Freq: Every day | ORAL | Status: DC
  Administered 2023-02-12 – 2023-02-16 (×5): 1 mg via ORAL
  Filled 2023-02-12 (×5): qty 1

## 2023-02-12 MED ORDER — DIAZEPAM 5 MG PO TABS
5.0000 mg | ORAL_TABLET | Freq: Three times a day (TID) | ORAL | Status: DC | PRN
  Administered 2023-02-12: 5 mg via ORAL
  Filled 2023-02-12: qty 1

## 2023-02-12 MED ORDER — LORAZEPAM 2 MG/ML IJ SOLN
1.0000 mg | INTRAMUSCULAR | Status: AC | PRN
  Administered 2023-02-12: 2 mg via INTRAVENOUS
  Filled 2023-02-12: qty 1

## 2023-02-12 MED ORDER — PHENAZOPYRIDINE HCL 100 MG PO TABS
100.0000 mg | ORAL_TABLET | Freq: Three times a day (TID) | ORAL | Status: AC
  Administered 2023-02-12 – 2023-02-14 (×6): 100 mg via ORAL
  Filled 2023-02-12 (×6): qty 1

## 2023-02-12 MED ORDER — METHYLPREDNISOLONE SODIUM SUCC 125 MG IJ SOLR
125.0000 mg | Freq: Every day | INTRAMUSCULAR | Status: AC
  Administered 2023-02-12 – 2023-02-14 (×3): 125 mg via INTRAVENOUS
  Filled 2023-02-12 (×3): qty 2

## 2023-02-12 MED ORDER — METOPROLOL TARTRATE 25 MG PO TABS
25.0000 mg | ORAL_TABLET | ORAL | Status: AC
  Administered 2023-02-12: 25 mg via ORAL
  Filled 2023-02-12: qty 1

## 2023-02-12 MED ORDER — ADULT MULTIVITAMIN W/MINERALS CH
1.0000 | ORAL_TABLET | Freq: Every day | ORAL | Status: DC
  Administered 2023-02-12 – 2023-02-16 (×5): 1 via ORAL
  Filled 2023-02-12 (×5): qty 1

## 2023-02-12 MED ORDER — HYDROCHLOROTHIAZIDE 12.5 MG PO TABS
12.5000 mg | ORAL_TABLET | Freq: Every day | ORAL | Status: DC
  Administered 2023-02-12 – 2023-02-16 (×5): 12.5 mg via ORAL
  Filled 2023-02-12 (×5): qty 1

## 2023-02-12 MED ORDER — ACETAMINOPHEN 325 MG PO TABS: 650.0000 mg | ORAL_TABLET | Freq: Four times a day (QID) | ORAL | Status: DC | PRN

## 2023-02-12 MED ORDER — ONDANSETRON HCL 4 MG/2ML IJ SOLN
4.0000 mg | Freq: Four times a day (QID) | INTRAMUSCULAR | Status: DC | PRN
  Administered 2023-02-12 – 2023-02-14 (×2): 4 mg via INTRAVENOUS
  Filled 2023-02-12 (×2): qty 2

## 2023-02-12 MED ORDER — HEPARIN SODIUM (PORCINE) 5000 UNIT/ML IJ SOLN: 5000.0000 [IU] | Freq: Three times a day (TID) | INTRAMUSCULAR | Status: DC

## 2023-02-12 MED ORDER — PERFLUTREN LIPID MICROSPHERE
1.0000 mL | INTRAVENOUS | Status: AC | PRN
  Administered 2023-02-12: 3 mL via INTRAVENOUS

## 2023-02-12 MED ORDER — MONTELUKAST SODIUM 10 MG PO TABS
10.0000 mg | ORAL_TABLET | Freq: Every day | ORAL | Status: DC
  Administered 2023-02-12 – 2023-02-15 (×4): 10 mg via ORAL
  Filled 2023-02-12 (×4): qty 1

## 2023-02-12 MED ORDER — LIDOCAINE 5 % EX PTCH
2.0000 | MEDICATED_PATCH | CUTANEOUS | Status: DC
  Administered 2023-02-12 – 2023-02-16 (×5): 2 via TRANSDERMAL
  Filled 2023-02-12 (×5): qty 2

## 2023-02-12 MED ORDER — ONDANSETRON HCL 4 MG PO TABS
4.0000 mg | ORAL_TABLET | Freq: Four times a day (QID) | ORAL | Status: DC | PRN
  Administered 2023-02-12: 4 mg via ORAL
  Filled 2023-02-12: qty 1

## 2023-02-12 MED ORDER — LISINOPRIL 10 MG PO TABS
20.0000 mg | ORAL_TABLET | Freq: Every day | ORAL | Status: DC
  Administered 2023-02-12 – 2023-02-16 (×5): 20 mg via ORAL
  Filled 2023-02-12 (×5): qty 2

## 2023-02-12 MED ORDER — POLYVINYL ALCOHOL 1.4 % OP SOLN
1.0000 [drp] | OPHTHALMIC | Status: DC | PRN
  Administered 2023-02-14 – 2023-02-16 (×2): 1 [drp] via OPHTHALMIC
  Filled 2023-02-12 (×2): qty 15

## 2023-02-12 MED ORDER — THIAMINE HCL 100 MG/ML IJ SOLN: 100.0000 mg | Freq: Every day | INTRAMUSCULAR | Status: DC

## 2023-02-12 MED ORDER — PANTOPRAZOLE SODIUM 40 MG IV SOLR
40.0000 mg | Freq: Two times a day (BID) | INTRAVENOUS | Status: DC
  Administered 2023-02-12 – 2023-02-16 (×9): 40 mg via INTRAVENOUS
  Filled 2023-02-12 (×9): qty 10

## 2023-02-12 NOTE — ED Notes (Signed)
ED TO INPATIENT HANDOFF REPORT  ED Nurse Name and Phone #: Winnie Barsky [phone]  S Name/Age/Gender Robin Malone 89 y.o. female Room/Bed: APA07/APA07  Code Status   Code Status: DNR  Home/SNF/Other Skilled nursing facility Patient oriented to: self, place, time, and situation  Is this baseline? Yes   Triage Complete: Triage complete  Chief Complaint Atrial fibrillation with RVR ([place]richard) [I48.91] Uncontrolled pain [R52]  Triage Note Pt arrived from home via [facility] reporting primarily lumbar back pain X 4 days after reporting her recent dental appointment exacerbated her back pain. Pt reports OTC medications and ETOH have not helped relieve her pain. Pt does endorse previous back surgeries.   Allergies Allergies  Allergen Reactions   Codeine Nausea And Vomiting   Hydrocodone-Acetaminophen Other (See Comments)   Levofloxacin Other (See Comments)    Joint Pain    Tramadol Other (See Comments)   Doxycycline Rash    Level of Care/Admitting Diagnosis ED Disposition     ED Disposition  Admit   Condition  --   [facility]: [facility] [id number]  Level of Care: Telemetry [5]  Covid Evaluation: Asymptomatic - no recent exposure (last 10 days) testing not required  Diagnosis: Uncontrolled pain [721020]  Admitting Physician: Murlean Iba [[id number]]  Attending Physician: Murlean Iba [[id number]]          B Medical/Surgery History Past Medical History:  Diagnosis Date   Chronic back pain    GERD (gastroesophageal reflux disease)    Hypercholesteremia    Hypertension    Thyroid disease    Past Surgical History:  Procedure Laterality Date   BACK SURGERY     Capsulotomy MPJ Right 10/01/2016   RT #2 Toe   Hammer Toe Repair Right 10/01/2016   RT #2 Toe     A IV Location/Drains/Wounds Patient Lines/Drains/Airways Status     Active Line/Drains/Airways     Name Placement date Placement time Site Days   Peripheral IV 02/12/23 20 G Left  Antecubital 02/12/23  0223  Antecubital  less than 1   External Urinary Catheter 02/12/23  0622  --  less than 1            Intake/Output Last 24 hours  Intake/Output Summary (Last 24 hours) at 02/12/2023 1323 Last data filed at 02/12/2023 [id number] Gross per 24 hour  Intake 1000 ml  Output --  Net 1000 ml    Labs/Imaging Results for orders placed or performed during the hospital encounter of 02/11/23 (from the past 48 hour(s))  Basic metabolic panel     Status: Abnormal   Collection Time: 02/11/23 11:53 PM  Result Value Ref Range   Sodium 137 135 - 145 mmol/L   Potassium 3.0 (L) 3.5 - 5.1 mmol/L   Chloride 100 98 - 111 mmol/L   CO2 25 22 - 32 mmol/L   Glucose, Bld 93 70 - 99 mg/dL    Comment: Glucose reference range applies only to samples taken after fasting for at least 8 hours.   BUN 13 8 - 23 mg/dL   Creatinine, Ser 0.69 0.44 - 1.00 mg/dL   Calcium 9.2 8.9 - 10.3 mg/dL   GFR, Estimated >60 >60 mL/min    Comment: (NOTE) Calculated using the CKD-EPI Creatinine Equation (2021)    Anion gap 12 5 - 15    Comment: Performed at [facility], [address], [place], [place] [address]  Troponin I (High Sensitivity)     Status: None   Collection Time:  02/11/23 11:53 PM  Result Value Ref Range   Troponin I (High Sensitivity) 5 <18 ng/L    Comment: (NOTE) Elevated high sensitivity troponin I (hsTnI) values and significant  changes across serial measurements may suggest ACS but many other  chronic and acute conditions are known to elevate hsTnI results.  Refer to the "Links" section for chest pain algorithms and additional  guidance. Performed at [facility], [address] [place], [place] [address]   CBC     Status: Abnormal   Collection Time: 02/12/23 12:34 AM  Result Value Ref Range   WBC 8.6 4.0 - 10.5 K/uL   RBC 3.05 (L) 3.87 - 5.11 MIL/uL   Hemoglobin 11.0 (L) 12.0 - 15.0 g/dL   HCT 34.3 (L) 36.0 - 46.0 %   MCV 112.5 (H) 80.0 - 100.0 fL   MCH 36.1 (H) 26.0 - 34.0 pg    MCHC 32.1 30.0 - 36.0 g/dL   RDW 15.0 11.5 - 15.5 %   Platelets 189 150 - 400 K/uL   nRBC 0.0 0.0 - 0.2 %    Comment: Performed at [facility], [address] [place], [place] [address]  Troponin I (High Sensitivity)     Status: None   Collection Time: 02/12/23  3:33 AM  Result Value Ref Range   Troponin I (High Sensitivity) 6 <18 ng/L    Comment: (NOTE) Elevated high sensitivity troponin I (hsTnI) values and significant  changes across serial measurements may suggest ACS but many other  chronic and acute conditions are known to elevate hsTnI results.  Refer to the "Links" section for chest pain algorithms and additional  guidance. Performed at [facility], [address] [place], [place] [address]   TSH     Status: None   Collection Time: 02/12/23  3:33 AM  Result Value Ref Range   TSH 3.280 0.350 - 4.500 uIU/mL    Comment: Performed by a 3rd Generation assay with a functional sensitivity of <=0.01 uIU/mL. Performed at [facility], [address], [place], [place] [address]   Comprehensive metabolic panel     Status: Abnormal   Collection Time: 02/12/23  7:06 AM  Result Value Ref Range   Sodium 138 135 - 145 mmol/L   Potassium 3.3 (L) 3.5 - 5.1 mmol/L   Chloride 104 98 - 111 mmol/L   CO2 23 22 - 32 mmol/L   Glucose, Bld 98 70 - 99 mg/dL    Comment: Glucose reference range applies only to samples taken after fasting for at least 8 hours.   BUN 13 8 - 23 mg/dL   Creatinine, Ser 0.57 0.44 - 1.00 mg/dL   Calcium 8.5 (L) 8.9 - 10.3 mg/dL   Total Protein 6.7 6.5 - 8.1 g/dL   Albumin 3.6 3.5 - 5.0 g/dL   AST 21 15 - 41 U/L   ALT 13 0 - 44 U/L   Alkaline Phosphatase 57 38 - 126 U/L   Total Bilirubin 0.7 0.3 - 1.2 mg/dL   GFR, Estimated >60 >60 mL/min    Comment: (NOTE) Calculated using the CKD-EPI Creatinine Equation (2021)    Anion gap 11 5 - 15    Comment: Performed at [facility], [address], [place], [place] [address]  Magnesium     Status: None   Collection  Time: 02/12/23  7:06 AM  Result Value Ref Range   Magnesium 2.1 1.7 - 2.4 mg/dL    Comment: Performed at [facility], [address], [place],  [place] [address]  CBC with Differential/Platelet     Status: Abnormal   Collection Time: 02/12/23  7:06 AM  Result Value Ref Range   WBC 8.9 4.0 - 10.5 K/uL   RBC 3.00 (L) 3.87 - 5.11 MIL/uL   Hemoglobin 10.9 (L) 12.0 - 15.0 g/dL   HCT 34.7 (L) 36.0 - 46.0 %   MCV 115.7 (H) 80.0 - 100.0 fL   MCH 36.3 (H) 26.0 - 34.0 pg   MCHC 31.4 30.0 - 36.0 g/dL   RDW 15.2 11.5 - 15.5 %   Platelets 184 150 - 400 K/uL   nRBC 0.0 0.0 - 0.2 %   Neutrophils Relative % 70 %   Neutro Abs 6.3 1.7 - 7.7 K/uL   Lymphocytes Relative 20 %   Lymphs Abs 1.8 0.7 - 4.0 K/uL   Monocytes Relative 8 %   Monocytes Absolute 0.7 0.1 - 1.0 K/uL   Eosinophils Relative 1 %   Eosinophils Absolute 0.1 0.0 - 0.5 K/uL   Basophils Relative 0 %   Basophils Absolute 0.0 0.0 - 0.1 K/uL   WBC Morphology MORPHOLOGY UNREMARKABLE    Smear Review MORPHOLOGY UNREMARKABLE    Immature Granulocytes 1 %   Abs Immature Granulocytes 0.06 0.00 - 0.07 K/uL   Stomatocytes PRESENT     Comment: Performed at [facility], [address], [place], [place] [address]  Urinalysis, Routine w reflex microscopic -Urine, Clean Catch     Status: Abnormal   Collection Time: 02/12/23  7:22 AM  Result Value Ref Range   Color, Urine YELLOW YELLOW   APPearance CLEAR CLEAR   Specific Gravity, Urine 1.010 1.005 - 1.030   pH 6.0 5.0 - 8.0   Glucose, UA NEGATIVE NEGATIVE mg/dL   Hgb urine dipstick NEGATIVE NEGATIVE   Bilirubin Urine NEGATIVE NEGATIVE   Ketones, ur NEGATIVE NEGATIVE mg/dL   Protein, ur NEGATIVE NEGATIVE mg/dL   Nitrite NEGATIVE NEGATIVE   Leukocytes,Ua TRACE (A) NEGATIVE   RBC / HPF 0-5 0 - 5 RBC/hpf   WBC, UA 0-5 0 - 5 WBC/hpf   Bacteria, UA RARE (A) NONE SEEN   Squamous Epithelial / HPF 0-5 0 - 5 /HPF   Mucus PRESENT     Comment: Performed at [facility], [address], [place],  [place] [address]   ECHOCARDIOGRAM COMPLETE  Result Date: 02/12/2023    ECHOCARDIOGRAM REPORT   Patient Name:   Robin Malone Date of Exam: 02/12/2023 Medical Rec #:  [id number]      Height:       63.0 in Accession #:    [id number]     Weight:       159.0 lb Date of Birth:  [birth year]      BSA:          1.754 m Patient Age:    52 years       BP:           174/79 mmHg Patient Gender: F              HR:           80 bpm. Exam Location:  [facility] Procedure: 2D Echo, Cardiac Doppler and Color Doppler Indications:    Atrial Fibrillation I48.91  History:        Patient has no prior history of Echocardiogram examinations.                 Risk Factors:Hypertension and Dyslipidemia. GERD.  Sonographer:    Alvino Chapel  RCS Referring Phys: [id number] ASIA B [place]  Sonographer Comments: Technically difficult study due to poor echo windows. Patient c/o extreme back spasms. Could not obtain correct positioning of patient. IMPRESSIONS  1. Left ventricular ejection fraction, by estimation, is 55%. The left ventricle has normal function. The left ventricle has no regional wall motion abnormalities. Left ventricular diastolic parameters are consistent with Grade II diastolic dysfunction (pseudonormalization).  2. Right ventricular systolic function is normal. The right ventricular size is mildly enlarged. There is mildly elevated pulmonary artery systolic pressure. The estimated right ventricular systolic pressure is 123XX123 mmHg.  3. Left atrial size was mildly dilated.  4. The mitral valve is grossly normal. Trivial mitral valve regurgitation. No evidence of mitral stenosis.  5. The aortic valve is grossly normal. There is mild thickening of the aortic valve. Aortic valve regurgitation is not visualized. No aortic stenosis is present.  6. The inferior vena cava is normal in size with greater than 50% respiratory variability, suggesting right atrial pressure of 3 mmHg. FINDINGS  Left Ventricle: Left ventricular ejection fraction, by  estimation, is 55%. The left ventricle has normal function. The left ventricle has no regional wall motion abnormalities. Definity contrast agent was given IV to delineate the left ventricular endocardial borders. The left ventricular internal cavity size was normal in size. There is no left ventricular hypertrophy. Left ventricular diastolic parameters are consistent with Grade II diastolic dysfunction (pseudonormalization). Right Ventricle: The right ventricular size is mildly enlarged. No increase in right ventricular wall thickness. Right ventricular systolic function is normal. There is mildly elevated pulmonary artery systolic pressure. The tricuspid regurgitant velocity is 3.01 m/s, and with an assumed right atrial pressure of 3 mmHg, the estimated right ventricular systolic pressure is 123XX123 mmHg. Left Atrium: Left atrial size was mildly dilated. Right Atrium: Right atrial size was normal in size. Pericardium: There is no evidence of pericardial effusion. Mitral Valve: The mitral valve is grossly normal. Trivial mitral valve regurgitation. No evidence of mitral valve stenosis. Tricuspid Valve: The tricuspid valve is normal in structure. Tricuspid valve regurgitation is mild . No evidence of tricuspid stenosis. Aortic Valve: The aortic valve is grossly normal. There is mild thickening of the aortic valve. Aortic valve regurgitation is not visualized. No aortic stenosis is present. Pulmonic Valve: The pulmonic valve was normal in structure. Pulmonic valve regurgitation is not visualized. No evidence of pulmonic stenosis. Aorta: The aortic root is normal in size and structure. Venous: The inferior vena cava is normal in size with greater than 50% respiratory variability, suggesting right atrial pressure of 3 mmHg. IAS/Shunts: The interatrial septum was not well visualized.  LEFT VENTRICLE PLAX 2D LVIDd:         4.60 cm   Diastology LVIDs:         2.80 cm   LV e' medial:    5.33 cm/s LV PW:         0.90 cm   LV  E/e' medial:  17.4 LV IVS:        1.00 cm   LV e' lateral:   8.70 cm/s LVOT diam:     1.60 cm   LV E/e' lateral: 10.6 LV SV:         41 LV SV Index:   24 LVOT Area:     2.01 cm  LEFT ATRIUM             Index        RIGHT ATRIUM  Index LA diam:        3.30 cm 1.88 cm/m   RA Area:     17.10 cm LA Vol (A2C):   64.6 ml 36.83 ml/m  RA Volume:   39.50 ml  22.52 ml/m LA Vol (A4C):   66.7 ml 38.03 ml/m LA Biplane Vol: 71.0 ml 40.48 ml/m  AORTIC VALVE LVOT Vmax:   80.30 cm/s LVOT Vmean:  57.400 cm/s LVOT VTI:    0.206 m  AORTA Ao Root diam: 2.70 cm MITRAL VALVE                TRICUSPID VALVE MV Area (PHT): 4.89 cm     TR Peak grad:   36.2 mmHg MV Decel Time: 155 msec     TR Vmax:        301.00 cm/s MV E velocity: 92.50 cm/s MV A velocity: 110.00 cm/s  SHUNTS MV E/A ratio:  0.84         Systemic VTI:  0.21 m                             Systemic Diam: 1.60 cm Cherlynn Kaiser MD Electronically signed by Cherlynn Kaiser MD Signature Date/Time: 02/12/2023/12:24:56 PM    Final    CT Lumbar Spine Wo Contrast  Result Date: 02/12/2023 CLINICAL DATA:  Low back pain x4 days EXAM: CT LUMBAR SPINE WITHOUT CONTRAST TECHNIQUE: Multidetector CT imaging of the lumbar spine was performed without intravenous contrast administration. Multiplanar CT image reconstructions were also generated. RADIATION DOSE REDUCTION: This exam was performed according to the departmental dose-optimization program which includes automated exposure control, adjustment of the mA and/or kV according to patient size and/or use of iterative reconstruction technique. COMPARISON:  MRI lumbar spine dated 01/30/2021 FINDINGS: Segmentation: 5 lumbar type vertebral bodies. Alignment: Grade 2 spondylolisthesis at L4-5, chronic. Vertebrae: No acute fracture or focal pathologic process. Paraspinal and other soft tissues: Atherosclerotic calcifications the abdominal aorta and branch vessels. 10 mm hyperdense/hemorrhagic left lower pole renal cyst (series  4/image 39), benign (Bosniak II). No focal is recommended. Disc levels: Moderate degenerative changes, most prominent at L2-3. Spinal canal is mildly narrowed but patent. IMPRESSION: No acute fracture or dislocation is seen. Grade 2 spondylolisthesis at L4-5, chronic. Moderate degenerative changes, most prominent at L2-3. Spinal canal is mildly narrowed but patent. Electronically Signed   By: Julian Hy M.D.   On: 02/12/2023 00:38    Pending Labs Unresulted Labs (From admission, onward)     Start     Ordered   02/13/23 0500  CBC  Daily,   R      02/12/23 0747   02/13/23 [id number]  Basic metabolic panel  Daily,   R      02/12/23 0747   02/13/23 0500  Magnesium  Tomorrow morning,   R        02/12/23 0747            Vitals/Pain Today's Vitals   02/12/23 1135 02/12/23 1200 02/12/23 1230 02/12/23 1247  BP:  138/64 (!) 155/75   Pulse:  69 70   Resp:  11 12   Temp: 98 F (36.7 C)     TempSrc: Oral     SpO2:  100% 100%   Weight:      Height:      PainSc:    Asleep    Isolation Precautions No active isolations  Medications Medications  pravastatin (PRAVACHOL) tablet 40 mg (has  no administration in time range)  levothyroxine (SYNTHROID) tablet 75 mcg (75 mcg Oral Given 02/12/23 0737)  montelukast (SINGULAIR) tablet 10 mg (has no administration in time range)  acetaminophen (TYLENOL) tablet 650 mg (has no administration in time range)    Or  acetaminophen (TYLENOL) suppository 650 mg (has no administration in time range)  oxyCODONE (Oxy IR/ROXICODONE) immediate release tablet 5 mg (5 mg Oral Given 02/12/23 0952)  ondansetron (ZOFRAN) tablet 4 mg ( Oral See Alternative 02/12/23 0957)    Or  ondansetron (ZOFRAN) injection 4 mg (4 mg Intravenous Given 02/12/23 0957)  lisinopril (ZESTRIL) tablet 20 mg (20 mg Oral Given 02/12/23 0951)    And  hydrochlorothiazide (HYDRODIURIL) tablet 12.5 mg (12.5 mg Oral Given 02/12/23 0951)  enoxaparin (LOVENOX) injection 72.5 mg (72.5 mg Subcutaneous  Given 02/12/23 0737)  polyvinyl alcohol (LIQUIFILM TEARS) 1.4 % ophthalmic solution 1 drop (has no administration in time range)  metoprolol tartrate (LOPRESSOR) tablet 25 mg (25 mg Oral Given 02/12/23 0951)  morphine (PF) 2 MG/ML injection 2 mg (has no administration in time range)  LORazepam (ATIVAN) tablet 1-4 mg (has no administration in time range)    Or  LORazepam (ATIVAN) injection 1-4 mg (has no administration in time range)  thiamine (VITAMIN B1) tablet 100 mg (100 mg Oral Given 02/12/23 0952)    Or  thiamine (VITAMIN B1) injection 100 mg ( Intravenous See Alternative 0000000 0000000)  folic acid (FOLVITE) tablet 1 mg (1 mg Oral Given 02/12/23 0951)  multivitamin with minerals tablet 1 tablet (1 tablet Oral Given 02/12/23 0951)  perflutren lipid microspheres (DEFINITY) IV suspension (3 mLs Intravenous Given 02/12/23 0936)  methocarbamol (ROBAXIN) tablet 500 mg (500 mg Oral Given 02/12/23 1114)  methylPREDNISolone sodium succinate (SOLU-MEDROL) 125 mg/2 mL injection 125 mg (125 mg Intravenous Given 02/12/23 1114)  pantoprazole (PROTONIX) injection 40 mg (40 mg Intravenous Given 02/12/23 1114)  lidocaine (LIDODERM) 5 % 2 patch (2 patches Transdermal Patch Applied 02/12/23 1114)  HYDROmorphone (DILAUDID) injection 0.5 mg (0.5 mg Intravenous Given 02/12/23 0006)  ondansetron (ZOFRAN) injection 4 mg (4 mg Intravenous Given 02/12/23 0006)  diazepam (VALIUM) injection 2.5 mg (2.5 mg Intravenous Given 02/12/23 0202)  ketorolac (TORADOL) 30 MG/ML injection 15 mg (15 mg Intravenous Given 02/12/23 0202)  metoCLOPramide (REGLAN) injection 5 mg (5 mg Intravenous Given 02/12/23 0213)  lactated ringers bolus 1,000 mL (0 mLs Intravenous Stopped 02/12/23 0332)  metoprolol tartrate (LOPRESSOR) tablet 25 mg (25 mg Oral Given 02/12/23 0346)  potassium chloride (KLOR-CON) packet 40 mEq (40 mEq Oral Given 02/12/23 0737)    Mobility Pt from home, pain making her unable to ambulate. Per PT, SNF is recommendation.     Focused  Assessments Pulmonary Assessment Handoff:  Lung sounds:   O2 Device: Room Air      R Recommendations: See Admitting Provider Note  Report given to:   Additional Notes:

## 2023-02-12 NOTE — Care Management Obs Status (Signed)
[place] NOTIFICATION   Patient Details  Name: Robin Malone MRN: [id number] Date of Birth: [birth year]   Medicare Observation Status Notification Given:  Yes    Royale Swamy Barrie Lyme, LCSW 02/12/2023, 12:26 PM

## 2023-02-12 NOTE — Evaluation (Signed)
Physical Therapy Evaluation Patient Details Name: Robin Malone MRN: [id number] DOB: 01/08/1934 Today's Date: 02/12/2023  History of Present Illness  Robin Malone is a 89 y.o. female with medical history significant of chronic back pain, GERD, hyper lipidemia, hypertension, thyroid disease, and more presents the ED with chief complaint of severe back pain.  Patient reports that it is a constant pain that would feel like a knife stabbing if she moved.  Patient reports that it started getting worse 6 days ago, and has been progressively worse since then.  Patient reports that she came here today because she could not even get up off of her couch.  She took 6 ibuprofen and 2 Tylenol without any relief.  Patient reports she has been drinking as much alcohol as she can to make the pain go away.  She denies any new incontinence or numbness.  She denies any chest pain, palpitations, dizziness.  Patient has no other complaints at this time.  Clinical Impression   Pt is a 89yo female presenting to [facility] due to reason stated in the HPI.   Pt was very limited in participation of today's Physical Therapy Evaluation, due increased levels of pain back stating 10/10. Pt performed max assist w/ bed mobility, unable to progressive OOB functional mobility assessment due to pain.   Based upon objective information, pt's functional deficits and impairments include safety and limitations in gait, mobility, and ADLs due to gross muscle weakness, balance deficits and elevated pain levels.  Based upon these deficits/impairments, pt would benefit from skilled acute physical therapy services to address the above deficits and improve their functional status.  PT recommends pt discharge to Skilled nursing short term rehab in order to improve pt's functional status, safety and independence with functional mobility and overall QOL.          Recommendations for follow up therapy are one component  of a multi-disciplinary discharge planning process, led by the attending physician.  Recommendations may be updated based on patient status, additional functional criteria and insurance authorization.  Follow Up Recommendations Skilled nursing-short term rehab (<3 hours/day) Can patient physically be transported by private vehicle: No    Assistance Recommended at Discharge Frequent or constant Supervision/Assistance  Patient can return home with the following  A lot of help with walking and/or transfers;A lot of help with bathing/dressing/bathroom;Assistance with cooking/housework    Equipment Recommendations None recommended by PT  Recommendations for Other Services  OT consult;Rehab consult    Functional Status Assessment Patient has had a recent decline in their functional status and demonstrates the ability to make significant improvements in function in a reasonable and predictable amount of time.     Precautions / Restrictions Precautions Precautions: Fall Restrictions Weight Bearing Restrictions: No      Mobility  Bed Mobility Overal bed mobility: Needs Assistance Bed Mobility: Rolling, Supine to Sit Rolling: Max assist   Supine to sit: Max assist     General bed mobility comments: Pt initially moving LEs to EOB with slow labored movements, max assist for trunk elevation but unable to complete movement as pt hesitant with elevated pain levels. Noted pt holding breathing cued to breathe deeply during mobility. Pt falling back to slight elevated HOB. second attempt for bed mobility with attempt use of log roll, pt requiring max assist to roll with heavy cuing provided. Attempted trunk elevation with max assist but continued to be limit in full elevation due pain. Pt showing continued hesistance with bed mobility  and further OOB mobility. Pt requiring max assist for lateral scooting and superior scooting for bed repositioning.    Transfers Overall transfer level: Needs  assistance                 General transfer comment: Unable to progress OOB mobility due to significant pain levels and pt hyperventaling during bed mobility.    Ambulation/Gait Ambulation/Gait assistance: Total assist             General Gait Details: Unable to progress OOB mobility due to significant pain levels and pt hyperventaling during bed mobility.  Stairs            Wheelchair Mobility    Modified Rankin (Stroke Patients Only)       Balance Overall balance assessment: Needs assistance Sitting-balance support: Bilateral upper extremity supported, No upper extremity supported Sitting balance-Leahy Scale: Poor Sitting balance - Comments: Half sitting position achieved, max assist required to maintain position. Postural control: Right lateral lean     Standing balance comment: Unable to assess standing balance due to significant pain levels and pt hyperventaling during bed mobility.                             Pertinent Vitals/Pain Pain Assessment Pain Assessment: Faces Pain Score: 10-Worst pain ever Faces Pain Scale: Hurts whole lot Pain Location: low back Pain Descriptors / Indicators: Aching Pain Intervention(s): Limited activity within patient's tolerance, Monitored during session    Home Living Family/patient expects to be discharged to:: Private residence Living Arrangements: Alone Available Help at Discharge: Family;Friend(s) Type of Home: House Home Access: Level entry       Home Layout: One level Home Equipment: Cane - quad      Prior Function Prior Level of Function : Independent/Modified Independent             Mobility Comments: Pt reports being at  modified independent level with ambulation and functional transfers. Just recently with new onset of pain, pt has been utilizing quad cane for mobility, with increased time and effort used for bed mobility at home. ADLs Comments: Pt reports prior to hospitalization  being independent with cooking, clothing, showering and toileting, but having aid perform cleaning duties around home.     Hand Dominance   Dominant Hand: Right    Extremity/Trunk Assessment   Upper Extremity Assessment Upper Extremity Assessment: Generalized weakness    Lower Extremity Assessment Lower Extremity Assessment: Generalized weakness    Cervical / Trunk Assessment Cervical / Trunk Assessment: Kyphotic  Communication   Communication: No difficulties  Cognition Arousal/Alertness: Awake/alert Behavior During Therapy: WFL for tasks assessed/performed, Anxious Overall Cognitive Status: Within Functional Limits for tasks assessed                                 General Comments: Noting flat affect due to increased pain levels.        General Comments      Exercises     Assessment/Plan    PT Assessment Patient needs continued PT services  PT Problem List Decreased strength;Decreased range of motion;Decreased activity tolerance;Decreased balance;Decreased mobility;Pain       PT Treatment Interventions Gait training;Functional mobility training;Therapeutic activities;Therapeutic exercise;Balance training;Neuromuscular re-education    PT Goals (Current goals can be found in the Care Plan section)  Acute Rehab PT Goals Patient Stated Goal: "go home" PT Goal Formulation: With  patient Time For Goal Achievement: 02/26/23 Potential to Achieve Goals: Good    Frequency Min 3X/week     Co-evaluation               AM-PAC PT "6 Clicks" Mobility  Outcome Measure Help needed turning from your back to your side while in a flat bed without using bedrails?: A Lot Help needed moving from lying on your back to sitting on the side of a flat bed without using bedrails?: A Lot Help needed moving to and from a bed to a chair (including a wheelchair)?: Total Help needed standing up from a chair using your arms (e.g., wheelchair or bedside chair)?:  Total Help needed to walk in hospital room?: Total Help needed climbing 3-5 steps with a railing? : Total 6 Click Score: 8    End of Session Equipment Utilized During Treatment: Oxygen Activity Tolerance: Patient limited by pain Patient left: in bed;with call bell/phone within reach Nurse Communication: Patient requests pain meds PT Visit Diagnosis: Muscle weakness (generalized) (M62.81);Other abnormalities of gait and mobility (R26.89);Pain Pain - part of body:  (Low back)    Time: [id number] PT Time Calculation (min) (ACUTE ONLY): 29 min   Charges:   PT Evaluation $PT Eval Moderate Complexity: 1 Mod          Wonda Olds PT, DPT Physical Therapist with [place] [phone] [phone] office  Wonda Olds 02/12/2023, 8:34 AM

## 2023-02-12 NOTE — Progress Notes (Signed)
*  PRELIMINARY RESULTS* Echocardiogram 2D Echocardiogram has been performed with Definity.  Samuel Germany 02/12/2023, 9:53 AM

## 2023-02-12 NOTE — Assessment & Plan Note (Signed)
Continue statin. 

## 2023-02-12 NOTE — Progress Notes (Signed)
PROGRESS NOTE    Robin Malone  [id number] DOB: [birth year] DOA: 02/11/2023 PCP: Verdell Carmine., MD  Brief Narrative:  Robin Malone is a 89 y.o. female with medical history significant of chronic back pain status post radioactive ablation of spinal nerves and L4-L5 fusion in 2003, GERD, hyperlipidemia, hypertension, thyroid disease, and more presents the ED with chief complaint of severe back pain.  Patient reported that pain had been progressively worsening for past week feeling like a knife stabbing if she moved.  Was unable to stand from couch today admission.  Had been taking 6 ibuprofen and 2 acetaminophen every 6 hours as well as drinking 1 to 2 glasses of wine and multiple shots of vodka for pain control without success.  Does "like her wine" at baseline.  Denies numbness or paresthesias but does note increased urge incontinence past few weeks.  On admission, noted to be in A-fib with RVR for 45 minutes and remained asymptomatic.  Converted to normal rhythm, though was found to have hypokalemia which was repleted.  Started on Lovenox for anticoagulation.  TTE performed showing grade 2 diastolic function with EF 55%.  Back pain did not improve with most recent value and hydrocodone.  Changed to a regimen of as needed morphine and oxycodone as well as methocarbamol for muscle spasms as well as supportive care with lidocaine patches and heating pads.  Problem List:   Principal Problem:   Atrial fibrillation with RVR (HCC) Active Problems:   Spondylolisthesis at L4-L5 level   Hypokalemia   Hypothyroidism   Mixed hyperlipidemia   HTN (hypertension)  Assessment and Plan:  Spondylolisthesis at L4-L5 level, chronic Chronic issue with extensive workup by specialist, PCP, pain clinic.  Acutely worsened likely by soft tissue injury.  Suspect main cause of acute worsening are muscle spasms.  Priority prior to discharge is better pain control given patient's unhealthy drinking habits to  help with pain as well as high intake of NSAIDs and acetaminophen. - Will likely require MRI on Monday given new or urinary continence and concern for impingement - Pain control with pain scale, morphine and oxycodone as needed ordered - Stop valium and start methocarbamol 500 mg QID for muscle spasms - Stop gabapentin in setting of multiple sedating medicines, unclear that this medicine is helping - PT/OT eval and treat - Supportive care with warming pads and lidocaine patch to minimize pharmacological intervention - Dysphagia diet to prevent aspiration while patient is unable to sit up straight  Atrial fibrillation with RVR Asymptomatic A-fib with RVR for approximately 45 minutes overnight.  No A-fib on evaluation in the morning.  Likely triggered by combination of pain and low potassium. - TTE showing G2DD with EF of 55% and mildly elevated pulmonary artery systolic pressure - Lovenox for anticoagulation - Optimize electrolytes - Patient will likely need to follow-up with cardiology outpatient for possible longer-term rhythm monitoring  Hypokalemia K 3.3 and Mg 2.1 in AM, repletion underway.  Possibly low in setting of reduced p.o. intake as well as increased alcohol intake. - Daily BMP - Goal K>4 and Mg>2  Hypothyroidism TSH WNL this admission. - Continue levothyroxine  Hypertension - Continue lisinopril and hydrochlorothiazide  Mixed hyperlipidemia - Continue statin  DVT prophylaxis: Lovenox Code Status: DNR Family Communication: Daughter updated at bedside 3/2 Disposition Plan: Patient is from: Home Anticipated d/c is to: SNF pending PT eval Anticipated d/c date is: 3/4 or 3/5 Patient currently: Pending better pain management, PT eval Admission status: Observation  Consultants:  None,  cardiology f/u outpatient  Procedures:  None  Antimicrobials:  None  Subjective: Patient seen and evaluated today with no new acute complaints or concerns. No acute concerns or  events noted overnight.  This morning, patient reports that she is feeling sharp muscle spasm like pain in her lower back.  She confirms that she has been taking Tylenol 650 mg 2 pills 3 times a day and ibuprofen at least 6 times a day as well as drinking 1 to 2 glasses of wine and vodka tonic.  She reports that her spinal pain is chronic and notes that she has had radioactive ablation of lumbar nerves at a pain clinic and an L4-L5 spinal fusion in 2003.  She has tried all sorts of medicines and been seen by multiple specialist.  She received cortisone injections on January 5 without improvement.  However, this episode appears to be acute following some jostling of her spine at a dentist appointment few weeks ago.  Does note some increased urinary urgency/incontinence new in the past few weeks.  Objective: Vitals:   02/12/23 0530 02/12/23 0630 02/12/23 0728 02/12/23 0729  BP: (!) 153/76  (!) 167/80   Pulse: 84 79  78  Resp: '10 11  17  '$ Temp:    98 F (36.7 C)  TempSrc:    Oral  SpO2: 99% 99%  100%  Weight:      Height:        Intake/Output Summary (Last 24 hours) at 02/12/2023 0800 Last data filed at 02/12/2023 [id number] Gross per 24 hour  Intake 1000 ml  Output --  Net 1000 ml   Filed Weights   02/11/23 2333  Weight: 72.1 kg    Examination: General: Elderly female resting in bed in discomfort but no acute distress.  Intermittently winces in pain. HEENT: NCAT.  MMM.  PERRLA. Lungs: CTAB.  Normal work of breathing on room air. Cardiovascular: Normal rate and rhythm.  No murmurs/rubs/gallops.  Capillary refill less than 2 seconds. Abdomen: Soft, nontender to palpation. Extremities: Peripheral edema bilaterally.  No sinus or clubbing.  Peripheral DP and PT pulses 2+ bilaterally Skin: Warm, dry.  No lesions or rashes grossly. Neuro: Alert and oriented x 4.  No focal neurological deficit. MSK: Limited range of motion in trunk, winces with movement, unable to raise head of bed greater than 10  degrees.  Equal and normal strength in bilateral lower and upper extremities.  Equal sensation in LE bilaterally.  Data Reviewed: I have personally reviewed following labs and imaging studies.  CBC: Recent Labs  Lab 02/12/23 0034 02/12/23 0706  WBC 8.6 8.9  NEUTROABS  --  6.3  HGB 11.0* 10.9*  HCT 34.3* 34.7*  MCV 112.5* 115.7*  PLT 189 [id number]   Basic Metabolic Panel: Recent Labs  Lab 02/11/23 2353  NA 137  K 3.0*  CL 100  CO2 25  GLUCOSE 93  BUN 13  CREATININE 0.69  CALCIUM 9.2   GFR: Estimated Creatinine Clearance: 48.1 mL/min (by C-G formula based on SCr of 0.69 mg/dL). Liver Function Tests: No results for input(s): "AST", "ALT", "ALKPHOS", "BILITOT", "PROT", "ALBUMIN" in the last 168 hours. No results for input(s): "LIPASE", "AMYLASE" in the last 168 hours. No results for input(s): "AMMONIA" in the last 168 hours. Coagulation Profile: No results for input(s): "INR", "PROTIME" in the last 168 hours. Cardiac Enzymes: No results for input(s): "CKTOTAL", "CKMB", "CKMBINDEX", "TROPONINI" in the last 168 hours. BNP (last 3 results) No results for input(s): "PROBNP" in the last 8760  hours. HbA1C: No results for input(s): "HGBA1C" in the last 72 hours. CBG: No results for input(s): "GLUCAP" in the last 168 hours. Lipid Profile: No results for input(s): "CHOL", "HDL", "LDLCALC", "TRIG", "CHOLHDL", "LDLDIRECT" in the last 72 hours. Thyroid Function Tests: Recent Labs    02/12/23 0333  TSH 3.280   Anemia Panel: No results for input(s): "VITAMINB12", "FOLATE", "FERRITIN", "TIBC", "IRON", "RETICCTPCT" in the last 72 hours. Sepsis Labs: No results for input(s): "PROCALCITON", "LATICACIDVEN" in the last 168 hours.  No results found for this or any previous visit (from the past 240 hour(s)).   Radiology Studies: CT Lumbar Spine Wo Contrast  Result Date: 02/12/2023 CLINICAL DATA:  Low back pain x4 days EXAM: CT LUMBAR SPINE WITHOUT CONTRAST TECHNIQUE: Multidetector CT  imaging of the lumbar spine was performed without intravenous contrast administration. Multiplanar CT image reconstructions were also generated. RADIATION DOSE REDUCTION: This exam was performed according to the departmental dose-optimization program which includes automated exposure control, adjustment of the mA and/or kV according to patient size and/or use of iterative reconstruction technique. COMPARISON:  MRI lumbar spine dated 01/30/2021 FINDINGS: Segmentation: 5 lumbar type vertebral bodies. Alignment: Grade 2 spondylolisthesis at L4-5, chronic. Vertebrae: No acute fracture or focal pathologic process. Paraspinal and other soft tissues: Atherosclerotic calcifications the abdominal aorta and branch vessels. 10 mm hyperdense/hemorrhagic left lower pole renal cyst (series 4/image 39), benign (Bosniak II). No focal is recommended. Disc levels: Moderate degenerative changes, most prominent at L2-3. Spinal canal is mildly narrowed but patent. IMPRESSION: No acute fracture or dislocation is seen. Grade 2 spondylolisthesis at L4-5, chronic. Moderate degenerative changes, most prominent at L2-3. Spinal canal is mildly narrowed but patent. Electronically Signed   By: Julian Hy M.D.   On: 02/12/2023 00:38    Scheduled Meds:  enoxaparin (LOVENOX) injection  1 mg/kg Subcutaneous Q000111Q   folic acid  1 mg Oral Daily   gabapentin  600 mg Oral QHS   lisinopril  20 mg Oral Daily   And   hydrochlorothiazide  12.5 mg Oral Daily   levothyroxine  75 mcg Oral Q0600   metoprolol tartrate  25 mg Oral BID   montelukast  10 mg Oral QHS   multivitamin with minerals  1 tablet Oral Daily   pravastatin  40 mg Oral q1800   thiamine  100 mg Oral Daily   Or   thiamine  100 mg Intravenous Daily   Continuous Infusions:   LOS: 0 days   Time spent: 35 minutes  Foye Haggart, MS4 [facility]  If 7PM-7AM, please contact night-coverage [email or web address] 02/12/2023, 8:00 AM

## 2023-02-12 NOTE — Assessment & Plan Note (Signed)
-   Continue Synthroid - TSH 3.280

## 2023-02-12 NOTE — Assessment & Plan Note (Signed)
-   Repleted 

## 2023-02-12 NOTE — Assessment & Plan Note (Addendum)
-   Patient was in A-fib with RVR for approximately 45 minutes - No history of A-fib - Asymptomatic - Blood pressure became soft, but corrected with bolus and when patient converted back to sinus spontaneously, blood pressure trended upwards - Likely secondary to hypokalemia - Echo in the a.m. - Patient does have a history of hypothyroidism, check TSH - Start Lovenox - Patient will likely need to follow-up with cardiology outpatient for possible longer-term rhythm monitoring

## 2023-02-12 NOTE — TOC Initial Note (Signed)
Transition of Care [facility]) - Initial/Assessment Note    Patient Details  Name: Robin Malone MRN: [id number] Date of Birth: 03/14/1934  Transition of Care [facility]) CM/SW Contact:    Boneta Lucks, RN Phone Number: 02/12/2023, 2:30 PM  Clinical Narrative:     Patient admitted with atrial Fib with RVR. TOC consulted for Substance abuse. Resources added to AVS. PT is recommending SNF. TOC spoke with daughter, she is agreeable. Requested PNC. Patient has been vaccinated. FL2 completed and sent out for bed offers. TOC will discuss offers with daughter. TOC following to start INS AUTH.              Expected Discharge Plan: Skilled Nursing Facility Barriers to Discharge: Continued Medical Work up   Patient Goals and CMS Choice Patient states their goals for this hospitalization and ongoing recovery are:: agreeable to SNF CMS Medicare.gov Compare Post Acute Care list provided to:: Other (Comment Required) Choice offered to / list presented to : Adult Children     Expected Discharge Plan and Services       Living arrangements for the past 2 months: Single Family Home                      Prior Living Arrangements/Services Living arrangements for the past 2 months: Single Family Home Lives with:: Self Patient language and need for interpreter reviewed:: Yes        Need for Family Participation in Patient Care: Yes (Comment) Care giver support system in place?: Yes (comment)   Criminal Activity/Legal Involvement Pertinent to Current Situation/Hospitalization: No - Comment as needed  Activities of Daily Living      Permission Sought/Granted        Emotional Assessment     Affect (typically observed): Accepting Orientation: : Oriented to Self, Oriented to Place, Oriented to  Time, Oriented to Situation Alcohol / Substance Use: Alcohol Use Psych Involvement: No (comment)  Admission diagnosis:  Atrial fibrillation with RVR (HCC) [I48.91] Uncontrolled pain [R52] Patient  Active Problem List   Diagnosis Date Noted   Atrial fibrillation with RVR ([place]) 02/12/2023   Hypokalemia 02/12/2023   Hypothyroidism 02/12/2023   Mixed hyperlipidemia 02/12/2023   HTN (hypertension) 02/12/2023   Uncontrolled pain 02/12/2023   Greater trochanteric bursitis of right hip 05/16/2019   Impingement syndrome of left shoulder 12/29/2017   Laceration of leg, left 06/24/2016   Primary osteoarthritis of left hip 05/25/2016   Toe pain, right 10/07/2015   Left cervical radiculopathy 03/27/2015   Spondylolisthesis at L4-L5 level 02/17/2015   Trochanteric bursitis of left hip 02/22/2014   Osteoarthritis of right knee 01/25/2014   PCP:  Verdell Carmine., MD Pharmacy:   [id number] [place], [place] - [address] [address]Phone: [phone]: [facility] [place] [address]Phone: [phone]: 8[facility] [phone] [address]Phone: [phone]: [phone]    Social Determinants of Health (SDOH) Social History: SDOH Screenings   Tobacco Use: Medium Risk (02/11/2023)   SDOH Interventions:

## 2023-02-12 NOTE — Assessment & Plan Note (Signed)
-   Continue lisinopril and hydrochlorothiazide

## 2023-02-12 NOTE — Assessment & Plan Note (Signed)
-   With chronic back pain - Back pain is acutely worse starting Monday - Pain control with pain scale - Continue Valium for muscle spasms - PT eval and treat - Continue to monitor

## 2023-02-12 NOTE — NC FL2 (Signed)
[place] LEVEL OF CARE FORM     IDENTIFICATION  Patient Name: Robin Malone Birthdate: 1934/06/08 Sex: female Admission Date (Current Location): 02/11/2023  [facility] and [place] Number:  [employer] and Address:  [facility] [address], [place]      Provider Number: [id number]  Attending Physician Name and Address:  Murlean Iba, MD  Relative Name and Phone Number:  Theodosia Blender (Daughter) [phone] [facility])    Current Level of Care: Hospital Recommended Level of Care: [place] Prior Approval Number:    Date Approved/Denied:   PASRR Number: [id number]  Discharge Plan: SNF    Current Diagnoses: Patient Active Problem List   Diagnosis Date Noted   Atrial fibrillation with RVR ([place]) 02/12/2023   Hypokalemia 02/12/2023   Hypothyroidism 02/12/2023   Mixed hyperlipidemia 02/12/2023   HTN (hypertension) 02/12/2023   Uncontrolled pain 02/12/2023   Greater trochanteric bursitis of right hip 05/16/2019   Impingement syndrome of left shoulder 12/29/2017   Laceration of leg, left 06/24/2016   Primary osteoarthritis of left hip 05/25/2016   Toe pain, right 10/07/2015   Left cervical radiculopathy 03/27/2015   Spondylolisthesis at L4-L5 level 02/17/2015   Trochanteric bursitis of left hip 02/22/2014   Osteoarthritis of right knee 01/25/2014    Orientation RESPIRATION BLADDER Height & Weight     Self, Time, Situation, Place  Normal Continent Weight: 72.1 kg Height:  '5\' 3"'$  (160 cm)  BEHAVIORAL SYMPTOMS/MOOD NEUROLOGICAL BOWEL NUTRITION STATUS      Continent Diet (see Dc summary)  AMBULATORY STATUS COMMUNICATION OF NEEDS Skin   Extensive Assist Verbally Normal                       Personal Care Assistance Level of Assistance  Bathing, Feeding, Dressing Bathing Assistance: Maximum assistance Feeding assistance: Limited assistance Dressing Assistance: Maximum assistance      Functional Limitations Info  Hearing, Sight, Speech Sight Info: Impaired Hearing Info: Impaired Speech Info: Adequate    SPECIAL CARE FACTORS FREQUENCY  PT (By licensed PT)     PT Frequency: 5 times a week              Contractures Contractures Info: Not present    Additional Factors Info  Code Status, Allergies Code Status Info: DNR Allergies Info: codeine, hydrocodone, levofloxacin, tramadol, doxycycline           Current Medications (02/12/2023):  This is the current hospital active medication list Current Facility-Administered Medications  Medication Dose Route Frequency Provider Last Rate Last Admin   acetaminophen (TYLENOL) tablet 650 mg  650 mg Oral Q6H PRN Zierle-Ghosh, Asia B, DO       Or   acetaminophen (TYLENOL) suppository 650 mg  650 mg Rectal Q6H PRN Zierle-Ghosh, Asia B, DO       enoxaparin (LOVENOX) injection 72.5 mg  1 mg/kg Subcutaneous Q12H Bryk, Veronda P, RPH   72.5 mg at [id number]   folic acid (FOLVITE) tablet 1 mg  1 mg Oral Daily Zierle-Ghosh, Asia B, DO   1 mg at 02/12/23 0951   lisinopril (ZESTRIL) tablet 20 mg  20 mg Oral Daily Zierle-Ghosh, Asia B, DO   20 mg at 02/12/23 0951   And   hydrochlorothiazide (HYDRODIURIL) tablet 12.5 mg  12.5 mg Oral Daily Zierle-Ghosh, Asia B, DO   12.5 mg at 02/12/23 0951   levothyroxine (SYNTHROID) tablet 75 mcg  75 mcg Oral Q0600 Zierle-Ghosh, [place] B,  DO   75 mcg at 02/12/23 0737   lidocaine (LIDODERM) 5 % 2 patch  2 patch Transdermal Q24H Johnson, Clanford L, MD   2 patch at 02/12/23 1114   LORazepam (ATIVAN) tablet 1-4 mg  1-4 mg Oral Q1H PRN Zierle-Ghosh, Asia B, DO       Or   LORazepam (ATIVAN) injection 1-4 mg  1-4 mg Intravenous Q1H PRN Zierle-Ghosh, Asia B, DO       methocarbamol (ROBAXIN) tablet 500 mg  500 mg Oral TID Johnson, Clanford L, MD       methylPREDNISolone sodium succinate (SOLU-MEDROL) 125 mg/2 mL injection 125 mg  125 mg Intravenous Daily Johnson, Clanford L, MD   125 mg at 02/12/23  1114   metoprolol tartrate (LOPRESSOR) tablet 25 mg  25 mg Oral BID Johnson, Clanford L, MD   25 mg at 02/12/23 0951   montelukast (SINGULAIR) tablet 10 mg  10 mg Oral QHS Zierle-Ghosh, Asia B, DO       morphine (PF) 2 MG/ML injection 2 mg  2 mg Intravenous Q4H PRN Johnson, Clanford L, MD       multivitamin with minerals tablet 1 tablet  1 tablet Oral Daily Zierle-Ghosh, Asia B, DO   1 tablet at 02/12/23 0951   ondansetron (ZOFRAN) tablet 4 mg  4 mg Oral Q6H PRN Zierle-Ghosh, Asia B, DO       Or   ondansetron (ZOFRAN) injection 4 mg  4 mg Intravenous Q6H PRN Zierle-Ghosh, Asia B, DO   4 mg at 02/12/23 0957   oxyCODONE (Oxy IR/ROXICODONE) immediate release tablet 5 mg  5 mg Oral Q4H PRN Zierle-Ghosh, Asia B, DO   5 mg at 02/12/23 0952   pantoprazole (PROTONIX) injection 40 mg  40 mg Intravenous Q12H Johnson, Clanford L, MD   40 mg at 02/12/23 1114   polyvinyl alcohol (LIQUIFILM TEARS) 1.4 % ophthalmic solution 1 drop  1 drop Both Eyes PRN Johnson, Clanford L, MD       potassium chloride SA (KLOR-CON M) CR tablet 40 mEq  40 mEq Oral BID Johnson, Clanford L, MD       pravastatin (PRAVACHOL) tablet 40 mg  40 mg Oral q1800 Zierle-Ghosh, Asia B, DO       thiamine (VITAMIN B1) tablet 100 mg  100 mg Oral Daily Zierle-Ghosh, Asia B, DO   100 mg at 02/12/23 [id number]   Or   thiamine (VITAMIN B1) injection 100 mg  100 mg Intravenous Daily Zierle-Ghosh, Asia B, DO       Current Outpatient Medications  Medication Sig Dispense Refill   diazepam (VALIUM) 5 MG tablet Take 1 tab PO 1 hour before procedure or imaging. (Patient not taking: Reported on 07/08/2021) 2 tablet 0   fluticasone (FLONASE) 50 MCG/ACT nasal spray Place into the nose.     gabapentin (NEURONTIN) 600 MG tablet TAKE 1 TABLET AT BEDTIME. 90 tablet 3   levothyroxine (SYNTHROID, LEVOTHROID) 75 MCG tablet Take by mouth.     lisinopril-hydrochlorothiazide (PRINZIDE,ZESTORETIC) 20-12.5 MG per tablet Take 1 tablet by mouth daily.     lovastatin (MEVACOR)  40 MG tablet Take 40 mg by mouth at bedtime. Taking 2 at bedtime.     montelukast (SINGULAIR) 10 MG tablet      mupirocin ointment (BACTROBAN) 2 %  (Patient not taking: Reported on 07/08/2021)  0   ondansetron (ZOFRAN) 4 MG tablet Take 1 tablet (4 mg total) by mouth every 8 (eight) hours as needed for nausea or vomiting. (Patient  not taking: Reported on 07/08/2021) 8 tablet 0     Discharge Medications: Please see discharge summary for a list of discharge medications.  Relevant Imaging Results:  Relevant Lab Results:   Additional Information SS# [id number]  Boneta Lucks, RN

## 2023-02-12 NOTE — Progress Notes (Signed)
ANTICOAGULATION CONSULT NOTE - Initial Consult  Pharmacy Consult for Lovenox Indication: atrial fibrillation  Allergies  Allergen Reactions   Codeine Nausea And Vomiting   Hydrocodone-Acetaminophen Other (See Comments)   Levofloxacin Other (See Comments)    Joint Pain    Tramadol Other (See Comments)   Doxycycline Rash    Patient Measurements: Height: '5\' 3"'$  (160 cm) Weight: 72.1 kg (159 lb) IBW/kg (Calculated) : 52.4  Vital Signs: Temp: 98.6 F (37 C) (03/01 2338) Temp Source: Oral (03/01 2338) BP: 153/76 (03/02 0530) Pulse Rate: 79 (03/02 0630)  Labs: Recent Labs    02/11/23 2353 02/12/23 0034 02/12/23 0333  HGB  --  11.0*  --   HCT  --  34.3*  --   PLT  --  189  --   CREATININE 0.69  --   --   TROPONINIHS 5  --  6    Estimated Creatinine Clearance: 48.1 mL/min (by C-G formula based on SCr of 0.69 mg/dL).   Medical History: Past Medical History:  Diagnosis Date   Chronic back pain    GERD (gastroesophageal reflux disease)    Hypercholesteremia    Hypertension    Thyroid disease     Assessment: 89yo female c/o back pain, during ED w/u HR jumped up and pt was found to be in Afib w/ RVR, to start LMWH.  Goal of Therapy:  Anti-Xa level 0.6-1 units/ml 4hrs after LMWH dose given Monitor platelets by anticoagulation protocol: Yes   Plan:  Lovenox 1 mg/kg SQ Q12H. Monitor CBC.  Wynona Neat, PharmD, BCPS  02/12/2023,6:53 AM

## 2023-02-12 NOTE — H&P (Signed)
History and Physical    Patient: Robin Malone [id number] DOB: 1934-01-20 DOA: 02/11/2023 DOS: the patient was seen and examined on 02/12/2023 PCP: Verdell Carmine., MD  Patient coming from: Home  Chief Complaint:  Chief Complaint  Patient presents with   Back Pain   HPI: Robin Malone is a 89 y.o. female with medical history significant of chronic back pain, GERD, hyper lipidemia, hypertension, thyroid disease, and more presents the ED with chief complaint of severe back pain.  Patient reports that it is a constant pain that would feel like a knife stabbing if she moved.  Patient reports that it started getting worse 6 days ago, and has been progressively worse since then.  Patient reports that she came here today because she could not even get up off of her couch.  She took 6 ibuprofen and 2 Tylenol without any relief.  Patient reports she has been drinking as much alcohol as she can to make the pain go away.  She denies any new incontinence or numbness.  She denies any chest pain, palpitations, dizziness.  Patient has no other complaints at this time.  Patient does not smoke.  She does drink alcohol see above.  She has been vaccinated for COVID.  Patient is DNR reporting that she has a living will that says she would rather have a natural passing if her heart stops beating. Review of Systems: As mentioned in the history of present illness. All other systems reviewed and are negative. Past Medical History:  Diagnosis Date   Chronic back pain    GERD (gastroesophageal reflux disease)    Hypercholesteremia    Hypertension    Thyroid disease    Past Surgical History:  Procedure Laterality Date   BACK SURGERY     Capsulotomy MPJ Right 10/01/2016   RT #2 Toe   Hammer Toe Repair Right 10/01/2016   RT #2 Toe   Social History:  reports that she has quit smoking. She has never used smokeless tobacco. She reports current alcohol use. She reports that she does not use drugs.  Allergies   Allergen Reactions   Codeine Nausea And Vomiting   Hydrocodone-Acetaminophen Other (See Comments)   Levofloxacin Other (See Comments)    Joint Pain    Tramadol Other (See Comments)   Doxycycline Rash    History reviewed. No pertinent family history.  Prior to Admission medications   Medication Sig Start Date End Date Taking? Authorizing Provider  diazepam (VALIUM) 5 MG tablet Take 1 tab PO 1 hour before procedure or imaging. Patient not taking: Reported on 07/08/2021 06/07/17   Silverio Decamp, MD  fluticasone [facility]) 50 MCG/ACT nasal spray Place into the nose.    [provider]  gabapentin (NEURONTIN) 600 MG tablet TAKE 1 TABLET AT BEDTIME. 02/21/17   Silverio Decamp, MD  levothyroxine (SYNTHROID, LEVOTHROID) 75 MCG tablet Take by mouth. 06/16/17   [provider]  lisinopril-hydrochlorothiazide (PRINZIDE,ZESTORETIC) 20-12.5 MG per tablet Take 1 tablet by mouth daily.    [provider]  lovastatin (MEVACOR) 40 MG tablet Take 40 mg by mouth at bedtime. Taking 2 at bedtime.    [provider]  montelukast (SINGULAIR) 10 MG tablet  02/17/15   [provider]  mupirocin ointment (BACTROBAN) 2 %  12/27/14   [provider]  ondansetron (ZOFRAN) 4 MG tablet Take 1 tablet (4 mg total) by mouth every 8 (eight) hours as needed for nausea or vomiting. Patient not taking: Reported on 07/08/2021 02/04/18  Fransico Meadow, [place]    Physical Exam: Vitals:   02/12/23 0343 02/12/23 0400 02/12/23 0430 02/12/23 0530  BP:  (!) 152/86 (!) 162/88 (!) 153/76  Pulse: 96 95 84 84  Resp: '12 12 13 10  '$ Temp:      TempSrc:      SpO2: 96% 99% 97% 99%  Weight:      Height:       1.  General: Patient lying supine in bed,  no acute distress   2. Psychiatric: Alert and oriented x 3, mood and behavior normal for situation, pleasant and cooperative with exam   3. Neurologic: Speech and language are normal, face is symmetric, moves all 4  extremities voluntarily, at baseline without acute deficits on limited exam, no saddle anesthesia   4. HEENMT:  Head is atraumatic, normocephalic, pupils reactive to light, neck is supple, trachea is midline, mucous membranes are moist   5. Respiratory : Lungs are clear to auscultation bilaterally without wheezing, rhonchi, rales, no cyanosis, no increase in work of breathing or accessory muscle use   6. Cardiovascular : Heart rate normal, rhythm is regular, no murmurs, rubs or gallops, peripheral edema present, peripheral pulses palpated   7. Gastrointestinal:  Abdomen is soft, nondistended, nontender to palpation bowel sounds active, no masses or organomegaly palpated   8. Skin:  Skin is warm, dry and intact without rashes, acute lesions, or ulcers on limited exam   9.Musculoskeletal:  No acute deformities or trauma, no asymmetry in tone, peripheral edema present, peripheral pulses palpated, no tenderness to palpation in the extremities  Data Reviewed: In the ED Temp 98.6, heart rate 92-150, respiratory rate 11-18, blood pressure 119/65-185/96, satting 96-100% No leukocytosis with a white blood cell count of 8.6, hemoglobin 11.0, platelets 189 Chemistry reveals a hypokalemia at 3.0 and is otherwise unremarkable Troponin 5, 6 Lumbar spine imaging shows a grade 2 spondylolisthesis at L4/L5 and moderate degenerative changes at L2/L3.  There is spinal canal narrowing but it is only mild. Initially patient was in sinus rhythm and the focus was on her back pain.  Patient then went into A-fib with RVR.  An EKG was done that showed a heart rate of 156.  Her blood pressure started to decrease show she was given fluids, and then her rhythm spontaneously corrected to sinus rhythm and another EKG was done showing sinus rhythm with a heart rate of 95 and a QTc of 500 Diltiazem drip was ordered but not started in the ED Valium was given for muscle spasms Dilaudid, Toradol given for pain Metoprolol  was given after patient converted back to sinus Reglan and Zofran was given in the ED as well    Assessment and Plan: * Atrial fibrillation with RVR ([place]) - Patient was in A-fib with RVR for approximately 45 minutes - No history of A-fib - Asymptomatic - Blood pressure became soft, but corrected with bolus and when patient converted back to sinus spontaneously, blood pressure trended upwards - Likely secondary to hypokalemia - Echo in the a.m. - Patient does have a history of hypothyroidism, check TSH - There was discussion about whether or not to start anticoagulation as patient was only out of rhythm for about 45 minutes, however since she was asymptomatic it is unknown how often she is going in and out of rhythm so Lovenox was started - Start Lovenox - Patient will likely need to follow-up with cardiology outpatient for possible longer-term rhythm monitoring  HTN (hypertension) - Continue lisinopril and  hydrochlorothiazide  Mixed hyperlipidemia - Continue statin  Hypothyroidism - Continue Synthroid - Check TSH  Hypokalemia - Potassium 3.0 - Patient reports decreased p.o. intake since Monday - Replace potassium with 40 mEq p.o. - Trend in the a.m.  Spondylolisthesis at L4-L5 level - With chronic back pain - Back pain is acutely worse starting Monday - Pain control with pain scale - Continue Valium for muscle spasms - PT eval and treat - Continue to monitor      Advance Care Planning:   Code Status: DNR  Consults: None at this time, however will need follow-up with cardiology  Family Communication: No family at bedside  Severity of Illness: The appropriate patient status for this patient is OBSERVATION. Observation status is judged to be reasonable and necessary in order to provide the required intensity of service to ensure the patient's safety. The patient's presenting symptoms, physical exam findings, and initial radiographic and laboratory data in the context  of their medical condition is felt to place them at decreased risk for further clinical deterioration. Furthermore, it is anticipated that the patient will be medically stable for discharge from the hospital within 2 midnights of admission.   Author: Rolla Plate, DO 02/12/2023 6:21 AM  For on call review [email or web address].

## 2023-02-12 NOTE — Plan of Care (Signed)
  Problem: Acute Rehab PT Goals(only PT should resolve) Goal: Pt Will Go Supine/Side To Sit Flowsheets (Taken 02/12/2023 0838) Pt will go Supine/Side to Sit: Independently Goal: Patient Will Perform Sitting Balance Flowsheets (Taken 02/12/2023 [phone]) Patient will perform sitting balance: Independently Goal: Patient Will Transfer Sit To/From Stand Flowsheets (Taken 02/12/2023 [phone]) Patient will transfer sit to/from stand:  Independently  with modified independence Goal: Pt Will Ambulate Flowsheets (Taken 02/12/2023 0838) Pt will Ambulate:  25 feet  Independently  with modified independence  with least restrictive assistive device  Wonda Olds PT, DPT Physical Therapist with [facility] [phone] office

## 2023-02-13 DIAGNOSIS — R52 Pain, unspecified: Secondary | ICD-10-CM | POA: Diagnosis not present

## 2023-02-13 DIAGNOSIS — E039 Hypothyroidism, unspecified: Secondary | ICD-10-CM | POA: Diagnosis not present

## 2023-02-13 DIAGNOSIS — I4891 Unspecified atrial fibrillation: Secondary | ICD-10-CM | POA: Diagnosis not present

## 2023-02-13 DIAGNOSIS — E876 Hypokalemia: Secondary | ICD-10-CM | POA: Diagnosis not present

## 2023-02-13 LAB — CBC
HCT: 32.4 % — ABNORMAL LOW (ref 36.0–46.0)
Hemoglobin: 10.1 g/dL — ABNORMAL LOW (ref 12.0–15.0)
MCH: 35.6 pg — ABNORMAL HIGH (ref 26.0–34.0)
MCHC: 31.2 g/dL (ref 30.0–36.0)
MCV: 114.1 fL — ABNORMAL HIGH (ref 80.0–100.0)
Platelets: 189 10*3/uL (ref 150–400)
RBC: 2.84 MIL/uL — ABNORMAL LOW (ref 3.87–5.11)
RDW: 14.8 % (ref 11.5–15.5)
WBC: 6.8 10*3/uL (ref 4.0–10.5)
nRBC: 0 % (ref 0.0–0.2)

## 2023-02-13 LAB — BASIC METABOLIC PANEL
Anion gap: 7 (ref 5–15)
BUN: 15 mg/dL (ref 8–23)
CO2: 27 mmol/L (ref 22–32)
Calcium: 8.4 mg/dL — ABNORMAL LOW (ref 8.9–10.3)
Chloride: 103 mmol/L (ref 98–111)
Creatinine, Ser: 0.61 mg/dL (ref 0.44–1.00)
GFR, Estimated: >60 mL/min (ref >60)
Glucose, Bld: 122 mg/dL — ABNORMAL HIGH (ref 70–99)
Potassium: 4.4 mmol/L (ref 3.5–5.1)
Sodium: 137 mmol/L (ref 135–145)

## 2023-02-13 LAB — MAGNESIUM: Magnesium: 2.1 mg/dL (ref 1.7–2.4)

## 2023-02-13 NOTE — Progress Notes (Signed)
PROGRESS NOTE   Robin Malone  [id number] DOB: 18-Jan-1934 DOA: 02/11/2023 PCP: Verdell Carmine., MD   Chief Complaint  Patient presents with   Back Pain   Level of care: Telemetry  Brief Admission History:  89 y.o. female with medical history significant of chronic back pain, GERD, hyper lipidemia, hypertension, thyroid disease, and more presents the ED with chief complaint of severe back pain.  Patient reports that it is a constant pain that would feel like a knife stabbing if she moved.  Patient reports that it started getting worse 6 days ago, and has been progressively worse since then.  Patient reports that she came here today because she could not even get up off of her couch.  She took 6 ibuprofen and 2 Tylenol without any relief.  Patient reports she has been drinking as much alcohol as she can to make the pain go away.  She denies any new incontinence or numbness.  She denies any chest pain, palpitations, dizziness.  Patient has no other complaints at this time.   Patient does not smoke.  She does drink alcohol see above.  She has been vaccinated for COVID.  Patient is DNR reporting that she has a living will that says she would rather have a natural passing if her heart stops beating.   Assessment and Plan: * Atrial fibrillation with RVR (HCC) - Patient was in A-fib with RVR for approximately 45 minutes - No history of A-fib per record search and per patient report - Asymptomatic - Blood pressure was initially soft, but corrected with IVF bolus  - Echo ordered - Patient does have a history of hypothyroidism, check TSH - Start Lovenox for full anticoagulation until able to tolerate oral better - Patient will likely need to follow-up with cardiology outpatient   Uncontrolled pain -- pain mgmt regimen ordered   HTN (hypertension) - Continue lisinopril and hydrochlorothiazide  Mixed hyperlipidemia - Continue statin  Hypothyroidism - Continue Synthroid - TSH  3.280  Hypokalemia - Repleted   Spondylolisthesis at L4-L5 level - With chronic back pain acutely exacerbated  - MRI lumbar spine pending  - Pain control measures - Continue robaxin for muscle spasms - PT eval and treat --> SNF recommended - IV solumedrol 125 mg x 3 doses  - lidocaine topical patches ordered    DVT prophylaxis: enoxaparin Code Status: DNR  Family Communication: daughter Disposition: Status is: Inpatient Remains inpatient appropriate because: uncontrolled pain    Consultants:   Procedures:   Antimicrobials:    Subjective: Pt reports feeling severe stiffness and muscle spasms in back. No loss of bowel or bladder control.   Objective: Vitals:   02/12/23 1548 02/12/23 2325 02/13/23 0354 02/13/23 0844  BP: (!) 166/79 138/62 135/62 128/60  Pulse: 76 75 70 83  Resp: '18 18 18 18  '$ Temp: 98.3 F (36.8 C) 98.3 F (36.8 C) 97.7 F (36.5 C) (!) 97.3 F (36.3 C)  TempSrc: Oral Oral Oral Oral  SpO2: 100% 98% 100% 100%  Weight:      Height:        Intake/Output Summary (Last 24 hours) at 02/13/2023 1343 Last data filed at 02/13/2023 0900 Gross per 24 hour  Intake 480 ml  Output 400 ml  Net 80 ml   Filed Weights   02/11/23 2333  Weight: 72.1 kg   Examination:  General exam: Appears calm and comfortable  Respiratory system: Clear to auscultation. Respiratory effort normal. Cardiovascular system: normal S1 & S2 heard. No JVD,  murmurs, rubs, gallops or clicks. No pedal edema. Gastrointestinal system: Abdomen is nondistended, soft and nontender. No organomegaly or masses felt. Normal bowel sounds heard. Central nervous system: Alert and oriented. No focal neurological deficits. Extremities: Symmetric 5 x 5 power. Skin: No rashes, lesions or ulcers. Psychiatry: Judgement and insight appear normal. Mood & affect appropriate.   Data Reviewed: I have personally reviewed following labs and imaging studies  CBC: Recent Labs  Lab 02/12/23 0034 02/12/23 0706  02/13/23 0430  WBC 8.6 8.9 6.8  NEUTROABS  --  6.3  --   HGB 11.0* 10.9* 10.1*  HCT 34.3* 34.7* 32.4*  MCV 112.5* 115.7* 114.1*  PLT 189 184 [id number]    Basic Metabolic Panel: Recent Labs  Lab 02/11/23 2353 02/12/23 0706 02/13/23 0430  NA 137 138 137  K 3.0* 3.3* 4.4  CL 100 104 103  CO2 '25 23 27  '$ GLUCOSE 93 98 122*  BUN '13 13 15  '$ CREATININE 0.69 0.57 0.61  CALCIUM 9.2 8.5* 8.4*  MG  --  2.1 2.1    CBG: No results for input(s): "GLUCAP" in the last 168 hours.  No results found for this or any previous visit (from the past 240 hour(s)).   Radiology Studies: ECHOCARDIOGRAM COMPLETE  Result Date: 02/12/2023    ECHOCARDIOGRAM REPORT   Patient Name:   Robin Malone Date of Exam: 02/12/2023 Medical Rec #:  [id number]      Height:       63.0 in Accession #:    [id number]     Weight:       159.0 lb Date of Birth:  [birth year]      BSA:          1.754 m Patient Age:    89 years       BP:           174/79 mmHg Patient Gender: F              HR:           80 bpm. Exam Location:  [facility] Procedure: 2D Echo, Cardiac Doppler and Color Doppler Indications:    Atrial Fibrillation I48.91  History:        Patient has no prior history of Echocardiogram examinations.                 Risk Factors:Hypertension and Dyslipidemia. GERD.  Sonographer:    Alvino Chapel RCS Referring Phys: [id number] ASIA B [place]  Sonographer Comments: Technically difficult study due to poor echo windows. Patient c/o extreme back spasms. Could not obtain correct positioning of patient. IMPRESSIONS  1. Left ventricular ejection fraction, by estimation, is 55%. The left ventricle has normal function. The left ventricle has no regional wall motion abnormalities. Left ventricular diastolic parameters are consistent with Grade II diastolic dysfunction (pseudonormalization).  2. Right ventricular systolic function is normal. The right ventricular size is mildly enlarged. There is mildly elevated pulmonary artery systolic pressure.  The estimated right ventricular systolic pressure is 123XX123 mmHg.  3. Left atrial size was mildly dilated.  4. The mitral valve is grossly normal. Trivial mitral valve regurgitation. No evidence of mitral stenosis.  5. The aortic valve is grossly normal. There is mild thickening of the aortic valve. Aortic valve regurgitation is not visualized. No aortic stenosis is present.  6. The inferior vena cava is normal in size with greater than 50% respiratory variability, suggesting right atrial pressure of 3 mmHg. FINDINGS  Left Ventricle: Left ventricular ejection fraction,  by estimation, is 55%. The left ventricle has normal function. The left ventricle has no regional wall motion abnormalities. Definity contrast agent was given IV to delineate the left ventricular endocardial borders. The left ventricular internal cavity size was normal in size. There is no left ventricular hypertrophy. Left ventricular diastolic parameters are consistent with Grade II diastolic dysfunction (pseudonormalization). Right Ventricle: The right ventricular size is mildly enlarged. No increase in right ventricular wall thickness. Right ventricular systolic function is normal. There is mildly elevated pulmonary artery systolic pressure. The tricuspid regurgitant velocity is 3.01 m/s, and with an assumed right atrial pressure of 3 mmHg, the estimated right ventricular systolic pressure is 123XX123 mmHg. Left Atrium: Left atrial size was mildly dilated. Right Atrium: Right atrial size was normal in size. Pericardium: There is no evidence of pericardial effusion. Mitral Valve: The mitral valve is grossly normal. Trivial mitral valve regurgitation. No evidence of mitral valve stenosis. Tricuspid Valve: The tricuspid valve is normal in structure. Tricuspid valve regurgitation is mild . No evidence of tricuspid stenosis. Aortic Valve: The aortic valve is grossly normal. There is mild thickening of the aortic valve. Aortic valve regurgitation is not  visualized. No aortic stenosis is present. Pulmonic Valve: The pulmonic valve was normal in structure. Pulmonic valve regurgitation is not visualized. No evidence of pulmonic stenosis. Aorta: The aortic root is normal in size and structure. Venous: The inferior vena cava is normal in size with greater than 50% respiratory variability, suggesting right atrial pressure of 3 mmHg. IAS/Shunts: The interatrial septum was not well visualized.  LEFT VENTRICLE PLAX 2D LVIDd:         4.60 cm   Diastology LVIDs:         2.80 cm   LV e' medial:    5.33 cm/s LV PW:         0.90 cm   LV E/e' medial:  17.4 LV IVS:        1.00 cm   LV e' lateral:   8.70 cm/s LVOT diam:     1.60 cm   LV E/e' lateral: 10.6 LV SV:         41 LV SV Index:   24 LVOT Area:     2.01 cm  LEFT ATRIUM             Index        RIGHT ATRIUM           Index LA diam:        3.30 cm 1.88 cm/m   RA Area:     17.10 cm LA Vol (A2C):   64.6 ml 36.83 ml/m  RA Volume:   39.50 ml  22.52 ml/m LA Vol (A4C):   66.7 ml 38.03 ml/m LA Biplane Vol: 71.0 ml 40.48 ml/m  AORTIC VALVE LVOT Vmax:   80.30 cm/s LVOT Vmean:  57.400 cm/s LVOT VTI:    0.206 m  AORTA Ao Root diam: 2.70 cm MITRAL VALVE                TRICUSPID VALVE MV Area (PHT): 4.89 cm     TR Peak grad:   36.2 mmHg MV Decel Time: 155 msec     TR Vmax:        301.00 cm/s MV E velocity: 92.50 cm/s MV A velocity: 110.00 cm/s  SHUNTS MV E/A ratio:  0.84         Systemic VTI:  0.21 m  Systemic Diam: 1.60 cm Cherlynn Kaiser MD Electronically signed by Cherlynn Kaiser MD Signature Date/Time: 02/12/2023/12:24:56 PM    Final    CT Lumbar Spine Wo Contrast  Result Date: 02/12/2023 CLINICAL DATA:  Low back pain x4 days EXAM: CT LUMBAR SPINE WITHOUT CONTRAST TECHNIQUE: Multidetector CT imaging of the lumbar spine was performed without intravenous contrast administration. Multiplanar CT image reconstructions were also generated. RADIATION DOSE REDUCTION: This exam was performed according to the  departmental dose-optimization program which includes automated exposure control, adjustment of the mA and/or kV according to patient size and/or use of iterative reconstruction technique. COMPARISON:  MRI lumbar spine dated 01/30/2021 FINDINGS: Segmentation: 5 lumbar type vertebral bodies. Alignment: Grade 2 spondylolisthesis at L4-5, chronic. Vertebrae: No acute fracture or focal pathologic process. Paraspinal and other soft tissues: Atherosclerotic calcifications the abdominal aorta and branch vessels. 10 mm hyperdense/hemorrhagic left lower pole renal cyst (series 4/image 39), benign (Bosniak II). No focal is recommended. Disc levels: Moderate degenerative changes, most prominent at L2-3. Spinal canal is mildly narrowed but patent. IMPRESSION: No acute fracture or dislocation is seen. Grade 2 spondylolisthesis at L4-5, chronic. Moderate degenerative changes, most prominent at L2-3. Spinal canal is mildly narrowed but patent. Electronically Signed   By: Julian Hy M.D.   On: 02/12/2023 00:38    Scheduled Meds:  enoxaparin (LOVENOX) injection  1 mg/kg Subcutaneous [id number]   folic acid  1 mg Oral Daily   lisinopril  20 mg Oral Daily   And   hydrochlorothiazide  12.5 mg Oral Daily   levothyroxine  75 mcg Oral Q0600   lidocaine  2 patch Transdermal Q24H   methocarbamol  500 mg Oral TID   methylPREDNISolone (SOLU-MEDROL) injection  125 mg Intravenous Daily   metoprolol tartrate  25 mg Oral BID   montelukast  10 mg Oral QHS   multivitamin with minerals  1 tablet Oral Daily   pantoprazole (PROTONIX) IV  40 mg Intravenous Q12H   phenazopyridine  100 mg Oral TID WC   pravastatin  40 mg Oral q1800   thiamine  100 mg Oral Daily   Or   thiamine  100 mg Intravenous Daily   Continuous Infusions:   LOS: 1 day   Time spent: 37 mins  Zykeria Laguardia Wynetta Emery, MD How to contact the [facility] Attending or Consulting provider [place] or covering provider during after hours [place], for this patient?  Check the  care team in [facility] and look for a) attending/consulting TR[facility] provider listed and b) the [facility] team listed Log into [email or web address] and use [place]'s universal password to access. If you do not have the password, please contact the hospital operator. Locate the [facility] provider you are looking for under [facility] and page to a number that you can be directly reached. If you still have difficulty reaching the provider, please page the [facility] (Director on Call) for the Hospitalists listed on amion for assistance.  02/13/2023, 1:43 PM

## 2023-02-13 NOTE — TOC Progression Note (Signed)
Transition of Care [facility]) - Progression Note    Patient Details  Name: Robin Malone MRN: [id number] Date of Birth: Apr 19, 1934  [facility]) CM/SW Contact  Ihor Gully, LCSW Phone Number: 02/13/2023, 2:57 PM  Clinical Narrative:    SNF auth started.   Expected Discharge Plan: [place] Barriers to Discharge: Continued Medical Work up  Expected Discharge Plan and Services       Living arrangements for the past 2 months: Single Family Home                                       Social Determinants of Health (SDOH) Interventions SDOH Screenings   Food Insecurity: No Food Insecurity (02/12/2023)  Housing: Low Risk  (02/12/2023)  Transportation Needs: No Transportation Needs (02/12/2023)  Utilities: Not At Risk (02/12/2023)  Tobacco Use: Medium Risk (02/11/2023)    Readmission Risk Interventions     No data to display

## 2023-02-13 NOTE — Assessment & Plan Note (Signed)
--   pain mgmt regimen ordered

## 2023-02-13 NOTE — Hospital Course (Signed)
Robin Malone is a 89 y.o. female with medical history significant of chronic back pain status post radioactive ablation of spinal nerves and L4-L5 fusion in 2003, GERD, hyperlipidemia, hypertension, thyroid disease, and more presents the ED with chief complaint of severe back pain.  Patient reported that pain had been progressively worsening for past week feeling like a knife stabbing if she moved.  Was unable to stand from couch today admission.  Had been taking 6 ibuprofen and 2 acetaminophen every 6 hours as well as drinking 1 to 2 glasses of wine and multiple shots of vodka for pain control without success.  Does "like her wine" at baseline.  Denies numbness or paresthesias but does note increased urge incontinence past few weeks.   On admission, noted to be in A-fib with RVR for 45 minutes and remained asymptomatic.  Converted to normal rhythm, though was found to have hypokalemia which was repleted.  Started on Lovenox for anticoagulation.  TTE performed showing grade 2 diastolic function with EF 55%.  Back pain did not improve with most recent value and hydrocodone.  Changed to a regimen of as needed morphine and oxycodone as well as methocarbamol for muscle spasms as well as supportive care with lidocaine patches and heating pads.

## 2023-02-14 ENCOUNTER — Inpatient Hospital Stay (HOSPITAL_COMMUNITY): Payer: Medicare HMO

## 2023-02-14 DIAGNOSIS — M4316 Spondylolisthesis, lumbar region: Secondary | ICD-10-CM | POA: Diagnosis not present

## 2023-02-14 DIAGNOSIS — M48061 Spinal stenosis, lumbar region without neurogenic claudication: Secondary | ICD-10-CM

## 2023-02-14 DIAGNOSIS — I4891 Unspecified atrial fibrillation: Secondary | ICD-10-CM | POA: Diagnosis not present

## 2023-02-14 DIAGNOSIS — R52 Pain, unspecified: Secondary | ICD-10-CM | POA: Diagnosis not present

## 2023-02-14 LAB — URINALYSIS, ROUTINE W REFLEX MICROSCOPIC
Bacteria, UA: NONE SEEN
Bilirubin Urine: NEGATIVE
Glucose, UA: 50 mg/dL — AB
Hgb urine dipstick: NEGATIVE
Ketones, ur: NEGATIVE mg/dL
Leukocytes,Ua: NEGATIVE
Nitrite: POSITIVE — AB
Protein, ur: NEGATIVE mg/dL
Specific Gravity, Urine: 1.009 (ref 1.005–1.030)
pH: 7 (ref 5.0–8.0)

## 2023-02-14 LAB — BASIC METABOLIC PANEL
Anion gap: 8 (ref 5–15)
BUN: 15 mg/dL (ref 8–23)
CO2: 29 mmol/L (ref 22–32)
Calcium: 8.3 mg/dL — ABNORMAL LOW (ref 8.9–10.3)
Chloride: 101 mmol/L (ref 98–111)
Creatinine, Ser: 0.62 mg/dL (ref 0.44–1.00)
GFR, Estimated: >60 mL/min (ref >60)
Glucose, Bld: 102 mg/dL — ABNORMAL HIGH (ref 70–99)
Potassium: 3.7 mmol/L (ref 3.5–5.1)
Sodium: 138 mmol/L (ref 135–145)

## 2023-02-14 MED ORDER — SODIUM CHLORIDE 0.9 % IV SOLN
1.0000 g | INTRAVENOUS | Status: DC
  Administered 2023-02-14: 1 g via INTRAVENOUS
  Filled 2023-02-14: qty 10

## 2023-02-14 MED ORDER — SALINE SPRAY 0.65 % NA SOLN
1.0000 | NASAL | Status: DC | PRN
  Filled 2023-02-14: qty 44

## 2023-02-14 MED ORDER — TRAZODONE HCL 50 MG PO TABS
50.0000 mg | ORAL_TABLET | Freq: Every day | ORAL | Status: DC
  Administered 2023-02-14 – 2023-02-15 (×2): 50 mg via ORAL
  Filled 2023-02-14 (×2): qty 1

## 2023-02-14 NOTE — Care Management Important Message (Signed)
Important Message  Patient Details  Name: Robin Malone MRN: [id number] Date of Birth: 27-Aug-1934   Medicare Important Message Given:  Yes     Tommy Medal 02/14/2023, 4:52 PM

## 2023-02-14 NOTE — Progress Notes (Signed)
Pt tearful and agitated because she could not go to sleep. PRN Ativan given for a CIWA score of 5. PRN Oxycodone given for reported 10/10 pain during the night. Pt calm this a.m. Vitals stable.

## 2023-02-14 NOTE — TOC Progression Note (Signed)
Transition of Care [facility]) - Progression Note    Patient Details  Name: Robin Malone MRN: [id number] Date of Birth: 10/10/1934  Transition of Care [facility]) CM/SW Contact  Salome Arnt,  Phone Number: 02/14/2023, 11:46 AM  Clinical Narrative: LCSW presented bed offers and pt chooses [facility]. Facility notified. CMA to update auth. Insurance requesting updated note due to pt's back pain during last treatment. PT updated.       Expected Discharge Plan: [place] Barriers to Discharge: Continued Medical Work up  Expected Discharge Plan and Services       Living arrangements for the past 2 months: Single Family Home                                       Social Determinants of Health (SDOH) Interventions SDOH Screenings   Food Insecurity: No Food Insecurity (02/12/2023)  Housing: Low Risk  (02/12/2023)  Transportation Needs: No Transportation Needs (02/12/2023)  Utilities: Not At Risk (02/12/2023)  Tobacco Use: Medium Risk (02/11/2023)    Readmission Risk Interventions     No data to display

## 2023-02-14 NOTE — Progress Notes (Signed)
PROGRESS NOTE    Robin Malone  [id number] DOB: May 19, 1934 DOA: 02/11/2023 PCP: Verdell Carmine., MD  Brief Narrative:  Robin Malone is a 89 y.o. female with medical history significant of chronic back pain status post radioactive ablation of spinal nerves and L4-L5 fusion in 2003, GERD, hyperlipidemia, hypertension, thyroid disease, and more presents the ED with chief complaint of severe back pain.  Patient reported that pain had been progressively worsening for past week feeling like a knife stabbing if she moved.  Was unable to stand from couch today admission.  Had been taking 6 ibuprofen and 2 acetaminophen every 6 hours as well as drinking 1 to 2 glasses of wine and multiple shots of vodka for pain control without success.  Does "like her wine" at baseline.  Denies numbness or paresthesias but does note increased urge incontinence past few weeks.   On admission, noted to be in A-fib with RVR for 45 minutes and remained asymptomatic.  Converted to normal rhythm, though was found to have hypokalemia which was repleted.  Started on Lovenox for anticoagulation.  TTE performed showing grade 2 diastolic function with EF 55%.  Back pain did not improve with most recent value and hydrocodone.  Changed to a regimen of as needed morphine and oxycodone as well as methocarbamol for muscle spasms and supportive care with lidocaine patches and heating pads.  Patient's pain remained poorly controlled with mild improvement and MRI spine was obtained 3/4, showing increased Schmorl's nodes and new prominent marrow edema.  Problem List:   Principal Problem:   Atrial fibrillation with RVR (HCC) Active Problems:   Spondylolisthesis at L4-L5 level   Hypokalemia   Hypothyroidism   Mixed hyperlipidemia   HTN (hypertension)   Uncontrolled pain  Assessment and Plan:  Spondylolisthesis at L4-L5 level, chronic Abnormal MRI spine, marrow edema Chronic issue with extensive workup by specialist, PCP, pain  clinic, but acutely worsened.  Priority prior to discharge is better pain control.  Pain currently poorly controlled, but mildly improved this morning.  Will continue current regimen and consider neurosurgery consult if not improving. - Pain control with pain scale, morphine and oxycodone as needed ordered - Methocarbamol 500 mg QID for muscle spasms - Stopped gabapentin 3/2 in setting of multiple sedating medicines - Supportive care with warming pads and lidocaine patch to minimize pharmacological intervention - Dysphagia diet to prevent aspiration while patient is unable to sit up straight  Uncontrolled pain - Pain management regimen as above - Adding trazodone given low mood and poor sleep  Atrial fibrillation with RVR, resolved Asymptomatic A-fib with RVR for approximately 45 minutes on admission.  NSR since.  Likely triggered by combination of pain and low potassium. - TTE showing G2DD with EF of 55% and mildly elevated pulmonary artery systolic pressure - Lovenox for anticoagulation - Optimize electrolytes - Patient will likely need to follow-up with cardiology outpatient for possible longer-term rhythm monitoring   Alcohol use disorder CIWA score of 5, received lorazepam x1 this morning. - CIWA precautions  Hypokalemia K 3.7 in AM.  Possibly low in setting of reduced p.o. intake as well as increased alcohol intake. - Daily BMP - Goal K>4 and Mg>2   Hypothyroidism TSH WNL this admission. - Continue levothyroxine   Hypertension - Continue lisinopril and hydrochlorothiazide   Mixed hyperlipidemia - Continue statin  DVT prophylaxis: Lovenox Code Status: Full Family Communication: Daughter Disposition Plan: Patient is from: Home Anticipated d/c is to: SNF Anticipated d/c date is: 3/5 Patient currently: Pending  MRI and pain management Admission Status is: Inpatient Remains inpatient appropriate because: Uncontrolled pain  Consultants:  None, cardiology f/u  outpatient   Procedures:  None   Antimicrobials:  None  Subjective: Patient seen and evaluated today with no new acute complaints or concerns. No acute concerns or events noted overnight.  This morning patient reports that pain is mildly improved, though still preventing her from sleeping.  Notes that she is very poorly and is tearful because of frustration and exhaustion.  Objective: Vitals:   02/13/23 0844 02/13/23 1721 02/13/23 2041 02/14/23 0348  BP: 128/60 (!) 116/57 (!) 145/69 131/62  Pulse: 83 80 81 70  Resp: '18 19 18 18  '$ Temp: (!) 97.3 F (36.3 C) 98.6 F (37 C) 98.2 F (36.8 C) 98.8 F (37.1 C)  TempSrc: Oral Oral Oral Oral  SpO2: 100% 100% 98% 97%  Weight:      Height:        Intake/Output Summary (Last 24 hours) at 02/14/2023 0732 Last data filed at 02/14/2023 0348 Gross per 24 hour  Intake 480 ml  Output 800 ml  Net -320 ml   Filed Weights   02/11/23 2333  Weight: 72.1 kg    Examination: General: Elderly female, appears younger than stated age.  Tearful affect.  Resting bed. HEENT: PERRLA.  MMM.  No scleral icterus. Lungs: CTAB.  No respiratory distress on room air. Cardiovascular: Normal S1/S2.  No murmurs/rubs or gallops. Abdomen: Nontender to palpation.  Soft and nondistended. Extremities: Warm, perfusing.  No peripheral edema.  No cyanosis or clubbing. Skin: Warm, dry.  No rashes grossly. Neuro: Equal strength and sensation bilaterally, including and lower extremities.  No numbness or paresthesias.  Alert and oriented x 4.  Data Reviewed: I have personally reviewed following labs and imaging studies.  CBC: Recent Labs  Lab 02/12/23 0034 02/12/23 0706 02/13/23 0430  WBC 8.6 8.9 6.8  NEUTROABS  --  6.3  --   HGB 11.0* 10.9* 10.1*  HCT 34.3* 34.7* 32.4*  MCV 112.5* 115.7* 114.1*  PLT 189 184 [id number]   Basic Metabolic Panel: Recent Labs  Lab 02/11/23 2353 02/12/23 0706 02/13/23 0430 02/14/23 0500  NA 137 138 137 138  K 3.0* 3.3* 4.4 3.7   CL 100 104 103 101  CO2 '25 23 27 29  '$ GLUCOSE 93 98 122* 102*  BUN '13 13 15 15  '$ CREATININE 0.69 0.57 0.61 0.62  CALCIUM 9.2 8.5* 8.4* 8.3*  MG  --  2.1 2.1  --    GFR: Estimated Creatinine Clearance: 48.1 mL/min (by C-G formula based on SCr of 0.62 mg/dL). Liver Function Tests: Recent Labs  Lab 02/12/23 0706  AST 21  ALT 13  ALKPHOS 57  BILITOT 0.7  PROT 6.7  ALBUMIN 3.6   No results for input(s): "LIPASE", "AMYLASE" in the last 168 hours. No results for input(s): "AMMONIA" in the last 168 hours. Coagulation Profile: No results for input(s): "INR", "PROTIME" in the last 168 hours. Cardiac Enzymes: No results for input(s): "CKTOTAL", "CKMB", "CKMBINDEX", "TROPONINI" in the last 168 hours. BNP (last 3 results) No results for input(s): "PROBNP" in the last 8760 hours. HbA1C: No results for input(s): "HGBA1C" in the last 72 hours. CBG: No results for input(s): "GLUCAP" in the last 168 hours. Lipid Profile: No results for input(s): "CHOL", "HDL", "LDLCALC", "TRIG", "CHOLHDL", "LDLDIRECT" in the last 72 hours. Thyroid Function Tests: Recent Labs    02/12/23 0333  TSH 3.280   Anemia Panel: No results for input(s): "VITAMINB12", "  FOLATE", "FERRITIN", "TIBC", "IRON", "RETICCTPCT" in the last 72 hours. Sepsis Labs: No results for input(s): "PROCALCITON", "LATICACIDVEN" in the last 168 hours.  No results found for this or any previous visit (from the past 240 hour(s)).   Radiology Studies: ECHOCARDIOGRAM COMPLETE  Result Date: 02/12/2023    ECHOCARDIOGRAM REPORT   Patient Name:   Robin Malone Date of Exam: 02/12/2023 Medical Rec #:  [id number]      Height:       63.0 in Accession #:    [id number]     Weight:       159.0 lb Date of Birth:  03-Nov-1934      BSA:          1.754 m Patient Age:    56 years       BP:           174/79 mmHg Patient Gender: F              HR:           80 bpm. Exam Location:  [facility] Procedure: 2D Echo, Cardiac Doppler and Color Doppler Indications:     Atrial Fibrillation I48.91  History:        Patient has no prior history of Echocardiogram examinations.                 Risk Factors:Hypertension and Dyslipidemia. GERD.  Sonographer:    Alvino Chapel RCS Referring Phys: [id number] ASIA B [place]  Sonographer Comments: Technically difficult study due to poor echo windows. Patient c/o extreme back spasms. Could not obtain correct positioning of patient. IMPRESSIONS  1. Left ventricular ejection fraction, by estimation, is 55%. The left ventricle has normal function. The left ventricle has no regional wall motion abnormalities. Left ventricular diastolic parameters are consistent with Grade II diastolic dysfunction (pseudonormalization).  2. Right ventricular systolic function is normal. The right ventricular size is mildly enlarged. There is mildly elevated pulmonary artery systolic pressure. The estimated right ventricular systolic pressure is 123XX123 mmHg.  3. Left atrial size was mildly dilated.  4. The mitral valve is grossly normal. Trivial mitral valve regurgitation. No evidence of mitral stenosis.  5. The aortic valve is grossly normal. There is mild thickening of the aortic valve. Aortic valve regurgitation is not visualized. No aortic stenosis is present.  6. The inferior vena cava is normal in size with greater than 50% respiratory variability, suggesting right atrial pressure of 3 mmHg. FINDINGS  Left Ventricle: Left ventricular ejection fraction, by estimation, is 55%. The left ventricle has normal function. The left ventricle has no regional wall motion abnormalities. Definity contrast agent was given IV to delineate the left ventricular endocardial borders. The left ventricular internal cavity size was normal in size. There is no left ventricular hypertrophy. Left ventricular diastolic parameters are consistent with Grade II diastolic dysfunction (pseudonormalization). Right Ventricle: The right ventricular size is mildly enlarged. No increase in right  ventricular wall thickness. Right ventricular systolic function is normal. There is mildly elevated pulmonary artery systolic pressure. The tricuspid regurgitant velocity is 3.01 m/s, and with an assumed right atrial pressure of 3 mmHg, the estimated right ventricular systolic pressure is 123XX123 mmHg. Left Atrium: Left atrial size was mildly dilated. Right Atrium: Right atrial size was normal in size. Pericardium: There is no evidence of pericardial effusion. Mitral Valve: The mitral valve is grossly normal. Trivial mitral valve regurgitation. No evidence of mitral valve stenosis. Tricuspid Valve: The tricuspid valve is normal in structure. Tricuspid valve  regurgitation is mild . No evidence of tricuspid stenosis. Aortic Valve: The aortic valve is grossly normal. There is mild thickening of the aortic valve. Aortic valve regurgitation is not visualized. No aortic stenosis is present. Pulmonic Valve: The pulmonic valve was normal in structure. Pulmonic valve regurgitation is not visualized. No evidence of pulmonic stenosis. Aorta: The aortic root is normal in size and structure. Venous: The inferior vena cava is normal in size with greater than 50% respiratory variability, suggesting right atrial pressure of 3 mmHg. IAS/Shunts: The interatrial septum was not well visualized.  LEFT VENTRICLE PLAX 2D LVIDd:         4.60 cm   Diastology LVIDs:         2.80 cm   LV e' medial:    5.33 cm/s LV PW:         0.90 cm   LV E/e' medial:  17.4 LV IVS:        1.00 cm   LV e' lateral:   8.70 cm/s LVOT diam:     1.60 cm   LV E/e' lateral: 10.6 LV SV:         41 LV SV Index:   24 LVOT Area:     2.01 cm  LEFT ATRIUM             Index        RIGHT ATRIUM           Index LA diam:        3.30 cm 1.88 cm/m   RA Area:     17.10 cm LA Vol (A2C):   64.6 ml 36.83 ml/m  RA Volume:   39.50 ml  22.52 ml/m LA Vol (A4C):   66.7 ml 38.03 ml/m LA Biplane Vol: 71.0 ml 40.48 ml/m  AORTIC VALVE LVOT Vmax:   80.30 cm/s LVOT Vmean:  57.400 cm/s LVOT  VTI:    0.206 m  AORTA Ao Root diam: 2.70 cm MITRAL VALVE                TRICUSPID VALVE MV Area (PHT): 4.89 cm     TR Peak grad:   36.2 mmHg MV Decel Time: 155 msec     TR Vmax:        301.00 cm/s MV E velocity: 92.50 cm/s MV A velocity: 110.00 cm/s  SHUNTS MV E/A ratio:  0.84         Systemic VTI:  0.21 m                             Systemic Diam: 1.60 cm Cherlynn Kaiser MD Electronically signed by Cherlynn Kaiser MD Signature Date/Time: 02/12/2023/12:24:56 PM    Final     Scheduled Meds:  enoxaparin (LOVENOX) injection  1 mg/kg Subcutaneous [id number]   folic acid  1 mg Oral Daily   lisinopril  20 mg Oral Daily   And   hydrochlorothiazide  12.5 mg Oral Daily   levothyroxine  75 mcg Oral Q0600   lidocaine  2 patch Transdermal Q24H   methocarbamol  500 mg Oral TID   methylPREDNISolone (SOLU-MEDROL) injection  125 mg Intravenous Daily   metoprolol tartrate  25 mg Oral BID   montelukast  10 mg Oral QHS   multivitamin with minerals  1 tablet Oral Daily   pantoprazole (PROTONIX) IV  40 mg Intravenous Q12H   phenazopyridine  100 mg Oral TID WC   pravastatin  40 mg  Oral q1800   thiamine  100 mg Oral Daily   Or   thiamine  100 mg Intravenous Daily   Continuous Infusions:   LOS: 2 days   Time spent: 35 minutes  Trenae Brunke, MS4 [facility]  If 7PM-7AM, please contact night-coverage [email or web address] 02/14/2023, 7:32 AM

## 2023-02-14 NOTE — Progress Notes (Signed)
Physical Therapy Treatment Patient Details Name: Robin Malone MRN: [id number] DOB: 1934-06-06 Today's Date: 02/14/2023   History of Present Illness Robin Malone is a 89 y.o. female with medical history significant of chronic back pain, GERD, hyper lipidemia, hypertension, thyroid disease, and more presents the ED with chief complaint of severe back pain.  Patient reports that it is a constant pain that would feel like a knife stabbing if she moved.  Patient reports that it started getting worse 6 days ago, and has been progressively worse since then.  Patient reports that she came here today because she could not even get up off of her couch.  She took 6 ibuprofen and 2 Tylenol without any relief.  Patient reports she has been drinking as much alcohol as she can to make the pain go away.  She denies any new incontinence or numbness.  She denies any chest pain, palpitations, dizziness.  Patient has no other complaints at this time.    PT Comments    Patient medicated for pain prior to out of bed activities and agreeable for therapy.  Patient  demonstrates fair/good return for rolling to side and siting up from side lying position using bed rail and Min/mod assist.  Patient demonstrates increased endurance/distance for gait training without loss of balance and limited mostly due to fatigue.  Patient tolerated sitting up in chair while on room air with SpO2 at 90% and her daughter present in room after therapy.  Patient will benefit from continued skilled physical therapy in hospital and recommended venue below to increase strength, balance, endurance for safe ADLs and gait.    Recommendations for follow up therapy are one component of a multi-disciplinary discharge planning process, led by the attending physician.  Recommendations may be updated based on patient status, additional functional criteria and insurance authorization.  Follow Up Recommendations  Skilled nursing-short term rehab (<3  hours/day) Can patient physically be transported by private vehicle: Yes   Assistance Recommended at Discharge Set up Supervision/Assistance  Patient can return home with the following A lot of help with walking and/or transfers;Assistance with cooking/housework;A little help with bathing/dressing/bathroom   Equipment Recommendations  None recommended by PT    Recommendations for Other Services       Precautions / Restrictions Precautions Precautions: Fall Restrictions Weight Bearing Restrictions: No     Mobility  Bed Mobility Overal bed mobility: Needs Assistance Bed Mobility: Rolling, Sidelying to Sit Rolling: Min assist Sidelying to sit: Min assist, Mod assist       General bed mobility comments: fair/good return for rolling to side and sitting up from side lying position with verbal/tactle cueing    Transfers Overall transfer level: Needs assistance Equipment used: Rolling walker (2 wheels) Transfers: Sit to/from Stand, Bed to chair/wheelchair/BSC Sit to Stand: Min assist   Step pivot transfers: Min assist       General transfer comment: slow labored movement with fair/good return for transferring to/from commode in bathroom using grab bar    Ambulation/Gait Ambulation/Gait assistance: Min assist Gait Distance (Feet): 40 Feet Assistive device: Rolling walker (2 wheels) Gait Pattern/deviations: Decreased step length - right, Decreased step length - left, Decreased stride length, Trunk flexed Gait velocity: decreased     General Gait Details: slow labored cadence without loss of balance using RW, limited mostly due to fatigue   Stairs             Wheelchair Mobility    Modified Rankin (Stroke Patients Only)  Balance Overall balance assessment: Needs assistance Sitting-balance support: Feet supported, No upper extremity supported Sitting balance-Leahy Scale: Fair Sitting balance - Comments: fair/good seated at EOB   Standing balance  support: During functional activity, Bilateral upper extremity supported Standing balance-Leahy Scale: Fair Standing balance comment: using RW                            Cognition Arousal/Alertness: Awake/alert Behavior During Therapy: WFL for tasks assessed/performed Overall Cognitive Status: Within Functional Limits for tasks assessed                                          Exercises      General Comments        Pertinent Vitals/Pain Pain Assessment Pain Assessment: Faces Faces Pain Scale: Hurts a little bit Pain Location: low back Pain Descriptors / Indicators: Spasm, Sore Pain Intervention(s): Limited activity within patient's tolerance, Monitored during session, Premedicated before session, Repositioned    Home Living                          Prior Function            PT Goals (current goals can now be found in the care plan section) Acute Rehab PT Goals Patient Stated Goal: return home after rehab PT Goal Formulation: With patient/family Time For Goal Achievement: 02/26/23 Potential to Achieve Goals: Good Progress towards PT goals: Progressing toward goals    Frequency    Min 3X/week      PT Plan Current plan remains appropriate    Co-evaluation              AM-PAC PT "6 Clicks" Mobility   Outcome Measure  Help needed turning from your back to your side while in a flat bed without using bedrails?: A Little Help needed moving from lying on your back to sitting on the side of a flat bed without using bedrails?: A Lot Help needed moving to and from a bed to a chair (including a wheelchair)?: A Little Help needed standing up from a chair using your arms (e.g., wheelchair or bedside chair)?: A Little Help needed to walk in hospital room?: A Little Help needed climbing 3-5 steps with a railing? : A Lot 6 Click Score: 16    End of Session   Activity Tolerance: Patient tolerated treatment well;Patient  limited by fatigue Patient left: in chair;with call bell/phone within reach;with family/visitor present Nurse Communication: Mobility status PT Visit Diagnosis: Muscle weakness (generalized) (M62.81);Other abnormalities of gait and mobility (R26.89);Pain;Unsteadiness on feet (R26.81)     Time: [id number] PT Time Calculation (min) (ACUTE ONLY): 30 min  Charges:  $Gait Training: 8-22 mins $Therapeutic Activity: 8-22 mins                     2:48 PM, 02/14/23 Lonell Grandchild, MPT Physical Therapist with [facility] [phone] office [phone] mobile phone

## 2023-02-15 DIAGNOSIS — E876 Hypokalemia: Secondary | ICD-10-CM | POA: Diagnosis not present

## 2023-02-15 DIAGNOSIS — R52 Pain, unspecified: Secondary | ICD-10-CM | POA: Diagnosis not present

## 2023-02-15 DIAGNOSIS — I4891 Unspecified atrial fibrillation: Secondary | ICD-10-CM | POA: Diagnosis not present

## 2023-02-15 DIAGNOSIS — M4316 Spondylolisthesis, lumbar region: Secondary | ICD-10-CM | POA: Diagnosis not present

## 2023-02-15 LAB — CBC
HCT: 33.4 % — ABNORMAL LOW (ref 36.0–46.0)
Hemoglobin: 10.5 g/dL — ABNORMAL LOW (ref 12.0–15.0)
MCH: 36.7 pg — ABNORMAL HIGH (ref 26.0–34.0)
MCHC: 31.4 g/dL (ref 30.0–36.0)
MCV: 116.8 fL — ABNORMAL HIGH (ref 80.0–100.0)
Platelets: 190 10*3/uL (ref 150–400)
RBC: 2.86 MIL/uL — ABNORMAL LOW (ref 3.87–5.11)
RDW: 14.8 % (ref 11.5–15.5)
WBC: 9.6 10*3/uL (ref 4.0–10.5)
nRBC: 0 % (ref 0.0–0.2)

## 2023-02-15 LAB — BASIC METABOLIC PANEL
Anion gap: 8 (ref 5–15)
BUN: 16 mg/dL (ref 8–23)
CO2: 30 mmol/L (ref 22–32)
Calcium: 8.5 mg/dL — ABNORMAL LOW (ref 8.9–10.3)
Chloride: 99 mmol/L (ref 98–111)
Creatinine, Ser: 0.55 mg/dL (ref 0.44–1.00)
GFR, Estimated: >60 mL/min (ref >60)
Glucose, Bld: 98 mg/dL (ref 70–99)
Potassium: 3.4 mmol/L — ABNORMAL LOW (ref 3.5–5.1)
Sodium: 137 mmol/L (ref 135–145)

## 2023-02-15 MED ORDER — MELATONIN 3 MG PO TABS
3.0000 mg | ORAL_TABLET | Freq: Every day | ORAL | Status: DC
  Administered 2023-02-15: 3 mg via ORAL
  Filled 2023-02-15: qty 1

## 2023-02-15 MED ORDER — OXYCODONE HCL 5 MG PO TABS: 5.0000 mg | ORAL_TABLET | Freq: Four times a day (QID) | ORAL | Status: DC

## 2023-02-15 MED ORDER — POTASSIUM CHLORIDE CRYS ER 20 MEQ PO TBCR
40.0000 meq | EXTENDED_RELEASE_TABLET | Freq: Once | ORAL | Status: AC
  Administered 2023-02-15: 40 meq via ORAL
  Filled 2023-02-15: qty 2

## 2023-02-15 MED ORDER — METHOCARBAMOL 500 MG PO TABS: 500.0000 mg | ORAL_TABLET | Freq: Three times a day (TID) | ORAL | Status: DC | PRN

## 2023-02-15 MED ORDER — SENNOSIDES-DOCUSATE SODIUM 8.6-50 MG PO TABS
1.0000 | ORAL_TABLET | Freq: Every day | ORAL | Status: DC
  Administered 2023-02-15: 1 via ORAL
  Filled 2023-02-15: qty 1

## 2023-02-15 MED ORDER — OXYCODONE HCL 5 MG PO TABS
5.0000 mg | ORAL_TABLET | Freq: Four times a day (QID) | ORAL | Status: DC
  Administered 2023-02-15 – 2023-02-16 (×4): 5 mg via ORAL
  Filled 2023-02-15 (×5): qty 1

## 2023-02-15 MED ORDER — POLYETHYLENE GLYCOL 3350 17 G PO PACK
17.0000 g | PACK | Freq: Two times a day (BID) | ORAL | Status: DC
  Administered 2023-02-15 – 2023-02-16 (×3): 17 g via ORAL
  Filled 2023-02-15 (×3): qty 1

## 2023-02-15 NOTE — Progress Notes (Signed)
Mobility Specialist Progress Note:    02/15/23 1431  Mobility  Activity Transferred from chair to bed  Level of Assistance Standby assist, set-up cues, supervision of patient - no hands on  Assistive Device Front wheel walker  Distance Ambulated (ft) 6 ft  Activity Response Tolerated well  Mobility Referral Yes  $Mobility charge 1 Mobility   Pt requested transfer from C>B. Tolerated session well, asx throughout. Left pt with all needs met, call bell in reach, floor mat placed, and alarm on.   Royetta Crochet Mobility Specialist Please contact via Solicitor or  Rehab office at [phone]

## 2023-02-15 NOTE — Progress Notes (Signed)
PROGRESS NOTE    Robin Malone  [id number] DOB: 1934-12-09 DOA: 02/11/2023 PCP: Verdell Carmine., MD  Brief Narrative:  Robin Malone is a 89 y.o. female with medical history significant of chronic back pain status post radioactive ablation of spinal nerves and L4-L5 fusion in 2003, GERD, hyperlipidemia, hypertension, thyroid disease, and more presents the ED with chief complaint of severe back pain.  Patient reported that pain had been progressively worsening for past week feeling like a knife stabbing if she moved.  Was unable to stand from couch today admission.  Had been taking 6 ibuprofen and 2 acetaminophen every 6 hours as well as drinking 1 to 2 glasses of wine and multiple shots of vodka for pain control without success.  Does "like her wine" at baseline.  Denies numbness or paresthesias but does note increased urge incontinence past few weeks.   On admission, noted to be in A-fib with RVR for 45 minutes and remained asymptomatic.  Converted to normal rhythm, though was found to have hypokalemia which was repleted.  Started on Lovenox for anticoagulation.  TTE performed showing grade 2 diastolic function with EF 55%.  Back pain did not improve with most recent value and hydrocodone.  Changed to a regimen of as needed morphine and oxycodone as well as methocarbamol for muscle spasms and supportive care with lidocaine patches and heating pads.  Patient's pain remained poorly controlled with mild improvement and MRI spine was obtained 3/4, showing increased Schmorl's nodes and new prominent marrow edema.  Patient finished 3-day Solu-Medrol course for Schmorl's inflammation.  Started on trazodone 3/4 for insomnia.  Noted not to have been received as needed meds and changed to scheduled oxycodone 3/5.  Also added melatonin 3/5 given persistent insomnia.  Problem List:   Principal Problem:   Atrial fibrillation with RVR (HCC) Active Problems:   Spondylolisthesis at L4-L5 level    Hypokalemia   Hypothyroidism   Mixed hyperlipidemia   HTN (hypertension)   Uncontrolled pain   Spinal stenosis of lumbar region without neurogenic claudication  Assessment and Plan:  Spondylolisthesis at L4-L5 level, chronic Schmorl's nodes, marrow edema on MRI Chronic issue with extensive workup by specialist, PCP, pain clinic, but acutely worsened.  Suspect Schmorl's nodes are responsible for some of acute pain with possible contribution of muscle spasms.  Pain currently still poorly controlled, but patient tolerating movement far better than on admission. - Pain control with pain scale, IV morphine as needed and oxycodone 5 mg every 6 hours now scheduled - Methocarbamol 500 mg every 8 hours as needed for muscle spasms - Stopped gabapentin 3/2 in setting of multiple sedating medicines - s/p 3-day course of IV Solu-Medrol - Supportive care with warming pads and lidocaine patch to minimize pharmacological intervention - Dysphagia diet to prevent aspiration while patient is unable to sit up straight   Uncontrolled pain Sleep difficulty Noticed the patient had not been receiving as needed pain meds and we will now schedule them.  Has not had a bowel movement in several days, will require bowel regiment especially given increased opiate intake. - Pain management regimen as above - Trazodone given last night for low mood and poor sleep, now also adding melatonin - Bowel regimen with Miralax twice daily started and recommended coffee as a laxative as well  Atrial fibrillation with RVR, resolved Asymptomatic A-fib with RVR for approximately 45 minutes on admission.  NSR since.  Likely triggered by combination of pain and low potassium. - TTE showing G2DD with EF  of 55% and mildly elevated pulmonary artery systolic pressure - Lovenox for anticoagulation - Optimize electrolytes - Patient will likely need to follow-up with cardiology outpatient for possible longer-term rhythm  monitoring  Dysuria Patient complained of urinary urgency and burning with urination overnight, UA with positive nitrites but negative leukocytes.  Suspect that Sx are more related to general medical condition and atrophy of muscles. - CTX ordered overnight, now discontinued - Pyridium ordered yesterday - CTM   Alcohol use disorder No signs of withdrawal or other symptoms. - CIWA precautions continuing   Hypokalemia K 3.4 in AM, does appear slow to replete.  Possibly low in setting of reduced p.o. intake as well as increased alcohol intake. - Daily BMP - Goal K>4 and Mg>2   Hypothyroidism TSH WNL this admission. - Continue levothyroxine   Hypertension - Continue lisinopril and hydrochlorothiazide   Mixed hyperlipidemia - Continue statin  DVT prophylaxis: Lovenox Code Status: Full Family Communication: Daughter updated in room 3/5 Disposition Plan: Patient is from: Home Anticipated d/c is to: SNF Anticipated d/c date is: 3/6 or 3/7 pending better pain control Patient currently: Pending better pain control Admission Status is: Inpatient Remains inpatient appropriate because: Uncontrolled pain, back injury workup  Consultants:  None  Procedures:  None  Antimicrobials:  CTX (3/5>???)  Subjective: Patient seen and evaluated today with no new acute complaints or concerns. No acute concerns or events noted overnight.  This morning, patient expresses continued frustration over lack of sleep and persistent pain.  Does say that he she is able to tolerate pain movement better than before.  Reports that this is a "new pain," that was known before.  Suspects that muscle spasms have relaxed but spine is causing more pain now.  Per patient and daughter agree that moving to SNF in the future would be a good idea if patient is able to tolerate.  Patient was unaware that she could request pain medicines every 4 hours and agrees that scheduled medicines would be a good idea.  Patient  agrees to work on movement in bed but is possible  Objective: Vitals:   02/14/23 0858 02/14/23 1207 02/14/23 2056 02/15/23 0430  BP: (!) 125/56 (!) 142/70 (!) 146/74 (!) 145/78  Pulse: 79 69 75 68  Resp:   18 20  Temp:  98.2 F (36.8 C) 98.7 F (37.1 C) (!) 97.3 F (36.3 C)  TempSrc:  Oral Oral Oral  SpO2:  90% 98% 98%  Weight:      Height:        Intake/Output Summary (Last 24 hours) at 02/15/2023 0729 Last data filed at 02/15/2023 0500 Gross per 24 hour  Intake 480 ml  Output 300 ml  Net 180 ml   Filed Weights   02/11/23 2333  Weight: 72.1 kg    Examination: General: Elderly female resting in bed in moderate discomfort.  Sitting up somewhat in bed and eating breakfast. HEENT: PERRLA.  EOM is intact. Lungs: CTAB.  Normal work of breathing on room air. Cardiovascular: Normal rate and rhythm.  No murmurs/rubs/gallops. Abdomen: Tenderness to palpation.  Soft, nondistended. GU: PureWick in place.  No suprapubic tenderness. Extremities: No peripheral edema bilaterally.  Capillary refill less than 2 seconds. Skin: Warm, dry.  No rashes grossly. Neuro: Alert and oriented x 4, no focal neurological deficit.  Equal strength and sensation in bilateral lower extremities.  Strength 5 out of 5.  Data Reviewed: I have personally reviewed following labs and imaging studies.  CBC: Recent Labs  Lab 02/12/23 0034 02/12/23 0706 02/13/23 0430  WBC 8.6 8.9 6.8  NEUTROABS  --  6.3  --   HGB 11.0* 10.9* 10.1*  HCT 34.3* 34.7* 32.4*  MCV 112.5* 115.7* 114.1*  PLT 189 184 [id number]   Basic Metabolic Panel: Recent Labs  Lab 02/11/23 2353 02/12/23 0706 02/13/23 0430 02/14/23 0500 02/15/23 0441  NA 137 138 137 138 137  K 3.0* 3.3* 4.4 3.7 3.4*  CL 100 104 103 101 99  CO2 '25 23 27 29 30  '$ GLUCOSE 93 98 122* 102* 98  BUN '13 13 15 15 16  '$ CREATININE 0.69 0.57 0.61 0.62 0.55  CALCIUM 9.2 8.5* 8.4* 8.3* 8.5*  MG  --  2.1 2.1  --   --    GFR: Estimated Creatinine Clearance: 48.1 mL/min  (by C-G formula based on SCr of 0.55 mg/dL). Liver Function Tests: Recent Labs  Lab 02/12/23 0706  AST 21  ALT 13  ALKPHOS 57  BILITOT 0.7  PROT 6.7  ALBUMIN 3.6   No results for input(s): "LIPASE", "AMYLASE" in the last 168 hours. No results for input(s): "AMMONIA" in the last 168 hours. Coagulation Profile: No results for input(s): "INR", "PROTIME" in the last 168 hours. Cardiac Enzymes: No results for input(s): "CKTOTAL", "CKMB", "CKMBINDEX", "TROPONINI" in the last 168 hours. BNP (last 3 results) No results for input(s): "PROBNP" in the last 8760 hours. HbA1C: No results for input(s): "HGBA1C" in the last 72 hours. CBG: No results for input(s): "GLUCAP" in the last 168 hours. Lipid Profile: No results for input(s): "CHOL", "HDL", "LDLCALC", "TRIG", "CHOLHDL", "LDLDIRECT" in the last 72 hours. Thyroid Function Tests: No results for input(s): "TSH", "T4TOTAL", "FREET4", "T3FREE", "THYROIDAB" in the last 72 hours. Anemia Panel: No results for input(s): "VITAMINB12", "FOLATE", "FERRITIN", "TIBC", "IRON", "RETICCTPCT" in the last 72 hours. Sepsis Labs: No results for input(s): "PROCALCITON", "LATICACIDVEN" in the last 168 hours.  No results found for this or any previous visit (from the past 240 hour(s)).   Radiology Studies: MR LUMBAR SPINE WO CONTRAST  Result Date: 02/14/2023 CLINICAL DATA:  Low back pain for the past week. EXAM: MRI LUMBAR SPINE WITHOUT CONTRAST TECHNIQUE: Multiplanar, multisequence MR imaging of the lumbar spine was performed. No intravenous contrast was administered. COMPARISON:  CT lumbar spine from 2 days ago. MRI lumbar spine dated January 30, 2021. FINDINGS: Segmentation:  Standard. Alignment: Unchanged 10 mm anterolisthesis at L4-L5. Unchanged mild stepwise retrolisthesis from L1-L2 through L3-L4. Unchanged trace anterolisthesis at L5-S1. Vertebrae: No fracture, evidence of discitis, or bone lesion. Progressive superior and inferior endplate Schmorl's  nodes at T11-T12 with new prominent reactive marrow edema. Mild reactive marrow edema at L1-L2. Conus medullaris and cauda equina: Conus extends to the L1 level. Conus and cauda equina appear normal. Paraspinal and other soft tissues: Postsurgical changes in the lower paraspinous soft tissues. No fluid collection. Otherwise negative. Disc levels: T12-L1: Unchanged mild disc bulging and bilateral facet arthropathy. No stenosis. L1-L2: Unchanged bulky circumferential disc osteophyte complex with superimposed left subarticular disc extrusion migrating inferiorly. Unchanged moderate bilateral facet arthropathy. Unchanged moderate to severe spinal canal stenosis and mild bilateral neuroforaminal stenosis. L2-L3: Unchanged bulky circumferential disc osteophyte complex and moderate bilateral facet arthropathy. Unchanged moderate to severe spinal canal stenosis with moderate left and mild right neuroforaminal stenosis. L3-L4: Unchanged mild disc bulging and moderate to severe bilateral facet arthropathy. Unchanged mild-to-moderate spinal canal and left lateral recess stenosis. Unchanged mild-to-moderate left and mild right neuroforaminal stenosis. L4-L5: Prior posterior decompression. Unchanged severe bilateral facet  arthropathy. Similar disc uncovering and mild disc bulging. Unchanged residual mild spinal canal and bilateral neuroforaminal stenosis. L5-S1: Unchanged mild disc bulging and severe bilateral facet arthropathy. Unchanged mild right lateral recess stenosis. No spinal canal or neuroforaminal stenosis. IMPRESSION: 1. No acute abnormality of the lumbar spine. 2. Progressive superior and inferior endplate Schmorl's nodes at T11-T12 with new prominent reactive marrow edema, which can be a source of pain. 3. Unchanged multilevel lumbar spondylosis as described above, worst at L1-L2 and L2-L3 where there is moderate to severe spinal canal stenosis. Electronically Signed   By: Titus Dubin M.D.   On: 02/14/2023 10:48     Scheduled Meds:  enoxaparin (LOVENOX) injection  1 mg/kg Subcutaneous [id number]   folic acid  1 mg Oral Daily   lisinopril  20 mg Oral Daily   And   hydrochlorothiazide  12.5 mg Oral Daily   levothyroxine  75 mcg Oral Q0600   lidocaine  2 patch Transdermal Q24H   methocarbamol  500 mg Oral TID   metoprolol tartrate  25 mg Oral BID   montelukast  10 mg Oral QHS   multivitamin with minerals  1 tablet Oral Daily   pantoprazole (PROTONIX) IV  40 mg Intravenous Q12H   pravastatin  40 mg Oral q1800   thiamine  100 mg Oral Daily   Or   thiamine  100 mg Intravenous Daily   traZODone  50 mg Oral QHS   Continuous Infusions:  cefTRIAXone (ROCEPHIN)  IV 1 g (02/14/23 2112)     LOS: 3 days   Time spent: 35 minutes  Eann Cleland, MS4 [facility]  If 7PM-7AM, please contact night-coverage [email or web address] 02/15/2023, 7:29 AM

## 2023-02-15 NOTE — Progress Notes (Signed)
Pt complained of urgency and burning during urination, MD made aware. Urinalysis ordered, positive nitrites noted. Notified MD, IV antibiotic ordered. Pt slept very little during the night. Pt hypertensive. PRN Oxycodone given and heat pack applied to lower back for 8/10 chronic pain.

## 2023-02-15 NOTE — TOC Progression Note (Signed)
Transition of Care [facility]) - Progression Note    Patient Details  Name: Robin Malone MRN: [id number] Date of Birth: April 11, 1934  Transition of Care [facility]) CM/SW Contact  Salome Arnt, [place] Phone Number: 02/15/2023, 10:01 AM  Clinical Narrative: SNF authorization received. Possible d/c tomorrow. [place] updated.       Expected Discharge Plan: [place] Barriers to Discharge: Continued Medical Work up  Expected Discharge Plan and Services       Living arrangements for the past 2 months: Single Family Home                                       Social Determinants of Health (SDOH) Interventions SDOH Screenings   Food Insecurity: No Food Insecurity (02/12/2023)  Housing: Low Risk  (02/12/2023)  Transportation Needs: No Transportation Needs (02/12/2023)  Utilities: Not At Risk (02/12/2023)  Tobacco Use: Medium Risk (02/11/2023)    Readmission Risk Interventions     No data to display

## 2023-02-16 LAB — BASIC METABOLIC PANEL
Anion gap: 6 (ref 5–15)
BUN: 16 mg/dL (ref 8–23)
CO2: 31 mmol/L (ref 22–32)
Calcium: 8.4 mg/dL — ABNORMAL LOW (ref 8.9–10.3)
Chloride: 101 mmol/L (ref 98–111)
Creatinine, Ser: 0.53 mg/dL (ref 0.44–1.00)
GFR, Estimated: >60 mL/min (ref >60)
Glucose, Bld: 93 mg/dL (ref 70–99)
Potassium: 3.8 mmol/L (ref 3.5–5.1)
Sodium: 138 mmol/L (ref 135–145)

## 2023-02-16 LAB — RESP PANEL BY RT-PCR (RSV, FLU A&B, COVID)  RVPGX2
Influenza A by PCR: NEGATIVE
Influenza B by PCR: NEGATIVE
Resp Syncytial Virus by PCR: NEGATIVE
SARS Coronavirus 2 by RT PCR: NEGATIVE

## 2023-02-16 LAB — MAGNESIUM: Magnesium: 2.2 mg/dL (ref 1.7–2.4)

## 2023-02-16 MED ORDER — SENNOSIDES-DOCUSATE SODIUM 8.6-50 MG PO TABS: 1.0000 | ORAL_TABLET | Freq: Every day | ORAL | 0 refills | Status: AC

## 2023-02-16 MED ORDER — METOPROLOL TARTRATE 25 MG PO TABS: 25.0000 mg | ORAL_TABLET | Freq: Two times a day (BID) | ORAL | 0 refills | Status: DC

## 2023-02-16 MED ORDER — APIXABAN 5 MG PO TABS: 5.0000 mg | ORAL_TABLET | Freq: Two times a day (BID) | ORAL | 0 refills | Status: DC

## 2023-02-16 MED ORDER — MELATONIN 3 MG PO TABS: 3.0000 mg | ORAL_TABLET | Freq: Every day | ORAL | 0 refills | Status: DC

## 2023-02-16 MED ORDER — POLYETHYLENE GLYCOL 3350 17 G PO PACK: 17.0000 g | PACK | Freq: Two times a day (BID) | ORAL | 0 refills | Status: DC

## 2023-02-16 MED ORDER — LIDOCAINE 5 % EX PTCH: 2.0000 | MEDICATED_PATCH | CUTANEOUS | 0 refills | Status: DC

## 2023-02-16 MED ORDER — POTASSIUM CHLORIDE CRYS ER 20 MEQ PO TBCR
40.0000 meq | EXTENDED_RELEASE_TABLET | Freq: Once | ORAL | Status: AC
  Administered 2023-02-16: 40 meq via ORAL
  Filled 2023-02-16: qty 2

## 2023-02-16 MED ORDER — METHOCARBAMOL 500 MG PO TABS: 500.0000 mg | ORAL_TABLET | Freq: Three times a day (TID) | ORAL | 0 refills | Status: DC | PRN

## 2023-02-16 MED ORDER — BISACODYL 10 MG RE SUPP: 10.0000 mg | Freq: Once | RECTAL | Status: DC

## 2023-02-16 MED ORDER — OXYCODONE HCL 5 MG PO TABS: 5.0000 mg | ORAL_TABLET | Freq: Four times a day (QID) | ORAL | 0 refills | Status: DC | PRN

## 2023-02-16 NOTE — Progress Notes (Signed)
Physical Therapy Treatment Patient Details Name: Robin Malone MRN: [id number] DOB: [birth year] Today's Date: 02/16/2023   History of Present Illness Robin Malone is a 89 y.o. female with medical history significant of chronic back pain, GERD, hyper lipidemia, hypertension, thyroid disease, and more presents the ED with chief complaint of severe back pain.  Patient reports that it is a constant pain that would feel like a knife stabbing if she moved.  Patient reports that it started getting worse 6 days ago, and has been progressively worse since then.  Patient reports that she came here today because she could not even get up off of her couch.  She took 6 ibuprofen and 2 Tylenol without any relief.  Patient reports she has been drinking as much alcohol as she can to make the pain go away.  She denies any new incontinence or numbness.  She denies any chest pain, palpitations, dizziness.  Patient has no other complaints at this time.    PT Comments    Patient presents seated on commode in bathroom (assisted by nursing staff) and agreeable for therapy.  Patient demonstrates labored movement for standing up from commode having to lean on RW for support, fair/ggood return for completing BLE ROM/strengthening exercises with verbal cues while seated in chair and tolerated walking into hallway with slow labored movement and limited mostly due to c/o fatigue.  Patient on room air during ambulation with SpO2 at 92% - nursing staff notified.  Patient will benefit from continued skilled physical therapy in hospital and recommended venue below to increase strength, balance, endurance for safe ADLs and gait.     Recommendations for follow up therapy are one component of a multi-disciplinary discharge planning process, led by the attending physician.  Recommendations may be updated based on patient status, additional functional criteria and insurance authorization.  Follow Up Recommendations  Skilled nursing-short  term rehab (<3 hours/day) Can patient physically be transported by private vehicle: Yes   Assistance Recommended at Discharge Set up Supervision/Assistance  Patient can return home with the following A lot of help with walking and/or transfers;Assistance with cooking/housework;A little help with bathing/dressing/bathroom   Equipment Recommendations  None recommended by PT    Recommendations for Other Services       Precautions / Restrictions Precautions Precautions: Fall Restrictions Weight Bearing Restrictions: No     Mobility  Bed Mobility               General bed mobility comments: Patient presents sitting on commode in bathroom (assisted by nursing staff)    Transfers Overall transfer level: Needs assistance Equipment used: Rolling walker (2 wheels) Transfers: Sit to/from Stand, Bed to chair/wheelchair/BSC Sit to Stand: Min assist   Step pivot transfers: Min assist       General transfer comment: increased time, labored movement    Ambulation/Gait Ambulation/Gait assistance: Min assist Gait Distance (Feet): 35 Feet Assistive device: Rolling walker (2 wheels) Gait Pattern/deviations: Decreased step length - right, Decreased step length - left, Decreased stride length, Trunk flexed Gait velocity: decreased     General Gait Details: slow labored unsteady cadecnce without loss of balance, on room air with SpO2 at 92% and limited mostly due to c/o fatigue   Stairs             Wheelchair Mobility    Modified Rankin (Stroke Patients Only)       Balance Overall balance assessment: Needs assistance Sitting-balance support: Feet supported, No upper extremity supported Sitting balance-Leahy Scale: [place] Sitting  balance - Comments: fair/good seated in chair   Standing balance support: During functional activity, Bilateral upper extremity supported Standing balance-Leahy Scale: Fair Standing balance comment: using RW                             Cognition Arousal/Alertness: Awake/alert Behavior During Therapy: WFL for tasks assessed/performed Overall Cognitive Status: Within Functional Limits for tasks assessed                                          Exercises General Exercises - Lower Extremity Long Arc Quad: Seated, AROM, Strengthening, Both, 10 reps Hip Flexion/Marching: Seated, AROM, Strengthening, Both, 10 reps Toe Raises: Seated, AROM, Strengthening, Both, 15 reps Heel Raises: Seated, AROM, Strengthening, Both, 15 reps    General Comments        Pertinent Vitals/Pain Pain Assessment Pain Assessment: Faces Faces Pain Scale: Hurts a little bit Pain Location: low back Pain Descriptors / Indicators: Sore Pain Intervention(s): Limited activity within patient's tolerance, Monitored during session, Repositioned    Home Living                          Prior Function            PT Goals (current goals can now be found in the care plan section) Acute Rehab PT Goals Patient Stated Goal: return home after rehab PT Goal Formulation: With patient/family Time For Goal Achievement: 02/26/23 Potential to Achieve Goals: Good Progress towards PT goals: Progressing toward goals    Frequency    Min 3X/week      PT Plan Current plan remains appropriate    Co-evaluation              AM-PAC PT "6 Clicks" Mobility   Outcome Measure  Help needed turning from your back to your side while in a flat bed without using bedrails?: A Little Help needed moving from lying on your back to sitting on the side of a flat bed without using bedrails?: A Lot Help needed moving to and from a bed to a chair (including a wheelchair)?: A Little Help needed standing up from a chair using your arms (e.g., wheelchair or bedside chair)?: A Little Help needed to walk in hospital room?: A Little Help needed climbing 3-5 steps with a railing? : A Lot 6 Click Score: 16    End of Session   Activity  Tolerance: Patient tolerated treatment well;Patient limited by fatigue Patient left: in chair;with call bell/phone within reach Nurse Communication: Mobility status PT Visit Diagnosis: Muscle weakness (generalized) (M62.81);Other abnormalities of gait and mobility (R26.89);Pain;Unsteadiness on feet (R26.81)     Time: [id number] PT Time Calculation (min) (ACUTE ONLY): 20 min  Charges:  $Gait Training: 8-22 mins $Therapeutic Exercise: 8-22 mins                     11:47 AM, 02/16/23 Lonell Grandchild, MPT Physical Therapist with [facility] [phone] office [phone] mobile phone

## 2023-02-16 NOTE — Discharge Summary (Signed)
Physician Discharge Summary  Robin Malone [id number] DOB: 1934-08-13 DOA: 02/11/2023  PCP: Verdell Carmine., MD  Admit date: 02/11/2023  Discharge date: 02/16/2023  Admitted From: Home  Disposition: SNF [facility])  Recommendations for Outpatient Follow-up:  Follow up with PCP in 1-2 weeks Please obtain BMP/CBC in one week. Significant falls risk, discontinued gabapentin for this reason. Significant GI bleed risk given recent high use of NSAIDs, steroid course inpatient, aspirin use, and high alcohol intake.  Please discuss alcohol use. Provided some oxycodone and methocarbamol PRN at discharge for SNF PT.  Please follow-up on pain management, concern for overuse of ibuprofen (6+ pills daily) and using alcohol (vodka) for pain control. Cardiology follow up for brief afib with RVR this hospitalization.  New Grade 2 Diastolic Dysfunction found on echocardiogram.  Started metoprolol and Eliquis. Please follow up on dysuria, determined not to be UTI, treated with pyridium 2 days.  Home Health: SNF  Equipment/Devices: Walker  Discharge Condition: Stable  CODE STATUS: Full  Diet recommendation: Dysphagia 3 (chopped)  Brief/Interim Summary: Robin Malone is a 89 y.o. female with medical history significant of chronic back pain status post radioactive ablation of spinal nerves and L4-L5 fusion in 2003, GERD, hyperlipidemia, hypertension, thyroid disease, and more presents the ED with chief complaint of severe back pain.  Patient reported that pain had been progressively worsening for past week feeling like a knife stabbing if she moved.  Was unable to stand from couch today admission.  Had been taking 6 ibuprofen and 2 acetaminophen every 6 hours as well as drinking 1 to 2 glasses of wine and multiple shots of vodka for pain control without success.  Does "like her wine" at baseline.  Denies numbness or paresthesias but does note increased urge incontinence past few weeks.   On admission,  noted to be in A-fib with RVR for 45 minutes and remained asymptomatic.  Converted to normal rhythm, though was found to have hypokalemia which was repleted.  Started on Lovenox for anticoagulation.  TTE performed showing grade 2 diastolic function with EF 55%.  Back pain did not improve with most recent value and hydrocodone.  Changed to a regimen of as needed morphine and oxycodone as well as methocarbamol for muscle spasms and supportive care with lidocaine patches and heating pads.  Patient's pain remained poorly controlled with mild improvement and MRI spine was obtained 3/4, showing increased Schmorl's nodes and new prominent marrow edema.  Patient finished 3-day Solu-Medrol course for Schmorl's inflammation.  Started on trazodone 3/4 for insomnia.  Noted not to have been received as needed meds and changed to scheduled oxycodone 3/5.  Also added melatonin 3/5 given persistent insomnia.  Pain control gradually improved and sleep as well.  Was able to ambulate with PT repeatedly through the hallways using walker.  Discharge Diagnoses:  Principal Problem:   Atrial fibrillation with RVR (HCC) Active Problems:   Spondylolisthesis at L4-L5 level   Hypokalemia   Hypothyroidism   Mixed hyperlipidemia   HTN (hypertension)   Uncontrolled pain   Spinal stenosis of lumbar region without neurogenic claudication   Hospitalized with acute on chronic lower spinal pain with an extensive chronic pain history in the setting of recent jostling of back during a dental appointment.  Discharge Instructions  Discharge Instructions     Diet - low sodium heart healthy   Complete by: As directed    Increase activity slowly   Complete by: As directed       Allergies as of  02/16/2023       Reactions   Codeine Nausea And Vomiting   Levofloxacin Other (See Comments)   Arthralgias    Norco [hydrocodone-acetaminophen] Nausea And Vomiting   Ultram [tramadol] Nausea And Vomiting   Vibra-tab [doxycycline] Rash         Medication List     STOP taking these medications    gabapentin 300 MG capsule Commonly known as: NEURONTIN   gabapentin 600 MG tablet Commonly known as: NEURONTIN   ibuprofen 200 MG tablet Commonly known as: ADVIL       TAKE these medications    acetaminophen 500 MG tablet Commonly known as: TYLENOL Take 1,000 mg by mouth 3 (three) times daily.   apixaban 5 MG Tabs tablet Commonly known as: ELIQUIS Take 1 tablet (5 mg total) by mouth 2 (two) times daily.   fluticasone 50 MCG/ACT nasal spray Commonly known as: FLONASE Place 1 spray into both nostrils at bedtime.   levothyroxine 75 MCG tablet Commonly known as: SYNTHROID Take 75 mcg by mouth at bedtime.   lidocaine 5 % Commonly known as: LIDODERM Place 2 patches onto the skin daily. Remove & Discard patch within 12 hours or as directed by MD Start taking on: February 17, 2023   lisinopril-hydrochlorothiazide 20-12.5 MG tablet Commonly known as: ZESTORETIC Take 1 tablet by mouth daily.   lovastatin 40 MG tablet Commonly known as: MEVACOR Take 80 mg by mouth at bedtime.   melatonin 3 MG Tabs tablet Take 1 tablet (3 mg total) by mouth at bedtime.   methocarbamol 500 MG tablet Commonly known as: ROBAXIN Take 1 tablet (500 mg total) by mouth every 8 (eight) hours as needed for muscle spasms.   metoprolol tartrate 25 MG tablet Commonly known as: LOPRESSOR Take 1 tablet (25 mg total) by mouth 2 (two) times daily.   montelukast 10 MG tablet Commonly known as: SINGULAIR Take 10 mg by mouth at bedtime as needed (allergies).   omeprazole 40 MG capsule Commonly known as: PRILOSEC Take 40 mg by mouth at bedtime.   oxyCODONE 5 MG immediate release tablet Commonly known as: Oxy IR/ROXICODONE Take 1 tablet (5 mg total) by mouth every 6 (six) hours as needed for severe pain or breakthrough pain.   polyethylene glycol 17 g packet Commonly known as: MIRALAX / GLYCOLAX Take 17 g by mouth 2 (two) times  daily.   senna-docusate 8.6-50 MG tablet Commonly known as: Senokot-S Take 1 tablet by mouth at bedtime.   VITAMIN B-12 PO Take 1 tablet by mouth daily.        Contact information for follow-up providers     Spry, Marsh Dolly., MD. Schedule an appointment as soon as possible for a visit in 1 week(s).   Specialty: Family Medicine Contact information: [address] [place] [address] [phone]              Contact information for after-discharge care     [facility] .   Service: Skilled Nursing Contact information: [address] [place] [address] [phone]                    Allergies  Allergen Reactions   Codeine Nausea And Vomiting   Levofloxacin Other (See Comments)    Arthralgias    Norco [Hydrocodone-Acetaminophen] Nausea And Vomiting   Ultram [Tramadol] Nausea And Vomiting   Vibra-Tab [Doxycycline] Rash    Consultations: None  Procedures/Studies: MR LUMBAR  SPINE WO CONTRAST  Result Date: 02/14/2023 CLINICAL DATA:  Low back pain for the past week. EXAM: MRI LUMBAR SPINE WITHOUT CONTRAST TECHNIQUE: Multiplanar, multisequence MR imaging of the lumbar spine was performed. No intravenous contrast was administered. COMPARISON:  CT lumbar spine from 2 days ago. MRI lumbar spine dated January 30, 2021. FINDINGS: Segmentation:  Standard. Alignment: Unchanged 10 mm anterolisthesis at L4-L5. Unchanged mild stepwise retrolisthesis from L1-L2 through L3-L4. Unchanged trace anterolisthesis at L5-S1. Vertebrae: No fracture, evidence of discitis, or bone lesion. Progressive superior and inferior endplate Schmorl's nodes at T11-T12 with new prominent reactive marrow edema. Mild reactive marrow edema at L1-L2. Conus medullaris and cauda equina: Conus extends to the L1 level. Conus and cauda equina appear normal. Paraspinal and other soft tissues: Postsurgical changes in the lower paraspinous  soft tissues. No fluid collection. Otherwise negative. Disc levels: T12-L1: Unchanged mild disc bulging and bilateral facet arthropathy. No stenosis. L1-L2: Unchanged bulky circumferential disc osteophyte complex with superimposed left subarticular disc extrusion migrating inferiorly. Unchanged moderate bilateral facet arthropathy. Unchanged moderate to severe spinal canal stenosis and mild bilateral neuroforaminal stenosis. L2-L3: Unchanged bulky circumferential disc osteophyte complex and moderate bilateral facet arthropathy. Unchanged moderate to severe spinal canal stenosis with moderate left and mild right neuroforaminal stenosis. L3-L4: Unchanged mild disc bulging and moderate to severe bilateral facet arthropathy. Unchanged mild-to-moderate spinal canal and left lateral recess stenosis. Unchanged mild-to-moderate left and mild right neuroforaminal stenosis. L4-L5: Prior posterior decompression. Unchanged severe bilateral facet arthropathy. Similar disc uncovering and mild disc bulging. Unchanged residual mild spinal canal and bilateral neuroforaminal stenosis. L5-S1: Unchanged mild disc bulging and severe bilateral facet arthropathy. Unchanged mild right lateral recess stenosis. No spinal canal or neuroforaminal stenosis. IMPRESSION: 1. No acute abnormality of the lumbar spine. 2. Progressive superior and inferior endplate Schmorl's nodes at T11-T12 with new prominent reactive marrow edema, which can be a source of pain. 3. Unchanged multilevel lumbar spondylosis as described above, worst at L1-L2 and L2-L3 where there is moderate to severe spinal canal stenosis. Electronically Signed   By: Titus Dubin M.D.   On: 02/14/2023 10:48   ECHOCARDIOGRAM COMPLETE  Result Date: 02/12/2023    ECHOCARDIOGRAM REPORT   Patient Name:   KRISA LONGHI Date of Exam: 02/12/2023 Medical Rec #:  [id number]      Height:       63.0 in Accession #:    [id number]     Weight:       159.0 lb Date of Birth:  1934/09/29      BSA:           1.754 m Patient Age:    89 years       BP:           174/79 mmHg Patient Gender: F              HR:           80 bpm. Exam Location:  [facility] Procedure: 2D Echo, Cardiac Doppler and Color Doppler Indications:    Atrial Fibrillation I48.91  History:        Patient has no prior history of Echocardiogram examinations.                 Risk Factors:Hypertension and Dyslipidemia. GERD.  Sonographer:    Alvino Chapel RCS Referring Phys: [id number] ASIA B [place]  Sonographer Comments: Technically difficult study due to poor echo windows. Patient c/o extreme back spasms. Could not obtain correct positioning of patient. IMPRESSIONS  1.  Left ventricular ejection fraction, by estimation, is 55%. The left ventricle has normal function. The left ventricle has no regional wall motion abnormalities. Left ventricular diastolic parameters are consistent with Grade II diastolic dysfunction (pseudonormalization).  2. Right ventricular systolic function is normal. The right ventricular size is mildly enlarged. There is mildly elevated pulmonary artery systolic pressure. The estimated right ventricular systolic pressure is 123XX123 mmHg.  3. Left atrial size was mildly dilated.  4. The mitral valve is grossly normal. Trivial mitral valve regurgitation. No evidence of mitral stenosis.  5. The aortic valve is grossly normal. There is mild thickening of the aortic valve. Aortic valve regurgitation is not visualized. No aortic stenosis is present.  6. The inferior vena cava is normal in size with greater than 50% respiratory variability, suggesting right atrial pressure of 3 mmHg. FINDINGS  Left Ventricle: Left ventricular ejection fraction, by estimation, is 55%. The left ventricle has normal function. The left ventricle has no regional wall motion abnormalities. Definity contrast agent was given IV to delineate the left ventricular endocardial borders. The left ventricular internal cavity size was normal in size. There is no left  ventricular hypertrophy. Left ventricular diastolic parameters are consistent with Grade II diastolic dysfunction (pseudonormalization). Right Ventricle: The right ventricular size is mildly enlarged. No increase in right ventricular wall thickness. Right ventricular systolic function is normal. There is mildly elevated pulmonary artery systolic pressure. The tricuspid regurgitant velocity is 3.01 m/s, and with an assumed right atrial pressure of 3 mmHg, the estimated right ventricular systolic pressure is 123XX123 mmHg. Left Atrium: Left atrial size was mildly dilated. Right Atrium: Right atrial size was normal in size. Pericardium: There is no evidence of pericardial effusion. Mitral Valve: The mitral valve is grossly normal. Trivial mitral valve regurgitation. No evidence of mitral valve stenosis. Tricuspid Valve: The tricuspid valve is normal in structure. Tricuspid valve regurgitation is mild . No evidence of tricuspid stenosis. Aortic Valve: The aortic valve is grossly normal. There is mild thickening of the aortic valve. Aortic valve regurgitation is not visualized. No aortic stenosis is present. Pulmonic Valve: The pulmonic valve was normal in structure. Pulmonic valve regurgitation is not visualized. No evidence of pulmonic stenosis. Aorta: The aortic root is normal in size and structure. Venous: The inferior vena cava is normal in size with greater than 50% respiratory variability, suggesting right atrial pressure of 3 mmHg. IAS/Shunts: The interatrial septum was not well visualized.  LEFT VENTRICLE PLAX 2D LVIDd:         4.60 cm   Diastology LVIDs:         2.80 cm   LV e' medial:    5.33 cm/s LV PW:         0.90 cm   LV E/e' medial:  17.4 LV IVS:        1.00 cm   LV e' lateral:   8.70 cm/s LVOT diam:     1.60 cm   LV E/e' lateral: 10.6 LV SV:         41 LV SV Index:   24 LVOT Area:     2.01 cm  LEFT ATRIUM             Index        RIGHT ATRIUM           Index LA diam:        3.30 cm 1.88 cm/m   RA Area:      17.10 cm LA Vol (A2C):  64.6 ml 36.83 ml/m  RA Volume:   39.50 ml  22.52 ml/m LA Vol (A4C):   66.7 ml 38.03 ml/m LA Biplane Vol: 71.0 ml 40.48 ml/m  AORTIC VALVE LVOT Vmax:   80.30 cm/s LVOT Vmean:  57.400 cm/s LVOT VTI:    0.206 m  AORTA Ao Root diam: 2.70 cm MITRAL VALVE                TRICUSPID VALVE MV Area (PHT): 4.89 cm     TR Peak grad:   36.2 mmHg MV Decel Time: 155 msec     TR Vmax:        301.00 cm/s MV E velocity: 92.50 cm/s MV A velocity: 110.00 cm/s  SHUNTS MV E/A ratio:  0.84         Systemic VTI:  0.21 m                             Systemic Diam: 1.60 cm Cherlynn Kaiser MD Electronically signed by Cherlynn Kaiser MD Signature Date/Time: 02/12/2023/12:24:56 PM    Final    CT Lumbar Spine Wo Contrast  Result Date: 02/12/2023 CLINICAL DATA:  Low back pain x4 days EXAM: CT LUMBAR SPINE WITHOUT CONTRAST TECHNIQUE: Multidetector CT imaging of the lumbar spine was performed without intravenous contrast administration. Multiplanar CT image reconstructions were also generated. RADIATION DOSE REDUCTION: This exam was performed according to the departmental dose-optimization program which includes automated exposure control, adjustment of the mA and/or kV according to patient size and/or use of iterative reconstruction technique. COMPARISON:  MRI lumbar spine dated 01/30/2021 FINDINGS: Segmentation: 5 lumbar type vertebral bodies. Alignment: Grade 2 spondylolisthesis at L4-5, chronic. Vertebrae: No acute fracture or focal pathologic process. Paraspinal and other soft tissues: Atherosclerotic calcifications the abdominal aorta and branch vessels. 10 mm hyperdense/hemorrhagic left lower pole renal cyst (series 4/image 39), benign (Bosniak II). No focal is recommended. Disc levels: Moderate degenerative changes, most prominent at L2-3. Spinal canal is mildly narrowed but patent. IMPRESSION: No acute fracture or dislocation is seen. Grade 2 spondylolisthesis at L4-5, chronic. Moderate degenerative changes,  most prominent at L2-3. Spinal canal is mildly narrowed but patent. Electronically Signed   By: Julian Hy M.D.   On: 02/12/2023 00:38     Discharge Exam: Vitals:   02/15/23 2120 02/16/23 0435  BP: (!) 139/59 (!) 128/56  Pulse: 70 65  Resp: 16 18  Temp: 97.9 F (36.6 C) 98 F (36.7 C)  SpO2: 97% 99%   Vitals:   02/15/23 1337 02/15/23 1830 02/15/23 2120 02/16/23 0435  BP: (!) 105/44 (!) 107/52 (!) 139/59 (!) 128/56  Pulse: 69 77 70 65  Resp: '19  16 18  '$ Temp: 98.5 F (36.9 C)  97.9 F (36.6 C) 98 F (36.7 C)  TempSrc:   Oral Oral  SpO2: 100%  97% 99%  Weight:      Height:       General: Healthy female resting in chair next to bed.  Pleasant and appropriate. HEENT: MMM.  No mucosal pallor. Lungs: CTAB.  Normal work of breathing on room air. Cardiovascular: Regular rate and rhythm.  No murmurs/rubs/gallops. Abdomen: Soft, nontender. GU: No Foley catheter. Extremities: No peripheral edema bilaterally.  Cap refill less than 2 seconds. Skin: Thin and papery skin, no significant rashes or lesions grossly. Neuro: Some psychomotor slowing.  And oriented x 4.  No focal neurological deficit. MSK: No tenderness to palpation over spine lumbar spine.  Observed able to ambulate in hallway with walker.   The results of significant diagnostics from this hospitalization (including imaging, microbiology, ancillary and laboratory) are listed below for reference.    Microbiology: No results found for this or any previous visit (from the past 240 hour(s)).   Labs: BNP (last 3 results) No results for input(s): "BNP" in the last 8760 hours. Basic Metabolic Panel: Recent Labs  Lab 02/12/23 0706 02/13/23 0430 02/14/23 0500 02/15/23 0441 02/16/23 0445  NA 138 137 138 137 138  K 3.3* 4.4 3.7 3.4* 3.8  CL 104 103 101 99 101  CO2 '23 27 29 30 31  '$ GLUCOSE 98 122* 102* 98 93  BUN '13 15 15 16 16  '$ CREATININE 0.57 0.61 0.62 0.55 0.53  CALCIUM 8.5* 8.4* 8.3* 8.5* 8.4*  MG 2.1 2.1  --    --  2.2   Liver Function Tests: Recent Labs  Lab 02/12/23 0706  AST 21  ALT 13  ALKPHOS 57  BILITOT 0.7  PROT 6.7  ALBUMIN 3.6   No results for input(s): "LIPASE", "AMYLASE" in the last 168 hours. No results for input(s): "AMMONIA" in the last 168 hours. CBC: Recent Labs  Lab 02/12/23 0034 02/12/23 0706 02/13/23 0430 02/15/23 1811  WBC 8.6 8.9 6.8 9.6  NEUTROABS  --  6.3  --   --   HGB 11.0* 10.9* 10.1* 10.5*  HCT 34.3* 34.7* 32.4* 33.4*  MCV 112.5* 115.7* 114.1* 116.8*  PLT 189 184 189 190   Cardiac Enzymes: No results for input(s): "CKTOTAL", "CKMB", "CKMBINDEX", "TROPONINI" in the last 168 hours. BNP: Invalid input(s): "POCBNP" CBG: No results for input(s): "GLUCAP" in the last 168 hours. D-Dimer No results for input(s): "DDIMER" in the last 72 hours. Hgb A1c No results for input(s): "HGBA1C" in the last 72 hours. Lipid Profile No results for input(s): "CHOL", "HDL", "LDLCALC", "TRIG", "CHOLHDL", "LDLDIRECT" in the last 72 hours. Thyroid function studies No results for input(s): "TSH", "T4TOTAL", "T3FREE", "THYROIDAB" in the last 72 hours.  Invalid input(s): "FREET3" Anemia work up No results for input(s): "VITAMINB12", "FOLATE", "FERRITIN", "TIBC", "IRON", "RETICCTPCT" in the last 72 hours. Urinalysis    Component Value Date/Time   COLORURINE AMBER (A) 02/14/2023 2011   APPEARANCEUR CLEAR 02/14/2023 2011   LABSPEC 1.009 02/14/2023 2011   PHURINE 7.0 02/14/2023 2011   GLUCOSEU 50 (A) 02/14/2023 2011   HGBUR NEGATIVE 02/14/2023 2011   BILIRUBINUR NEGATIVE 02/14/2023 2011   BILIRUBINUR small (A) 02/04/2018 1239   KETONESUR NEGATIVE 02/14/2023 2011   PROTEINUR NEGATIVE 02/14/2023 2011   UROBILINOGEN 0.2 02/04/2018 1239   NITRITE POSITIVE (A) 02/14/2023 2011   LEUKOCYTESUR NEGATIVE 02/14/2023 2011   Sepsis Labs Recent Labs  Lab 02/12/23 0034 02/12/23 0706 02/13/23 0430 02/15/23 1811  WBC 8.6 8.9 6.8 9.6   Microbiology No results found for  this or any previous visit (from the past 240 hour(s)).   Time coordinating discharge: 35 minutes  SIGNED:   Tishanna Dunford, MS4 [facility] 02/16/2023, 11:06 AM  If 7PM-7AM, please contact night-coverage [email or web address]

## 2023-02-16 NOTE — Progress Notes (Signed)
Mobility Specialist Progress Note:    02/16/23 0830  Mobility  Activity Transferred from bed to chair  Level of Assistance Contact guard assist, steadying assist  Assistive Device Front wheel walker  Distance Ambulated (ft) 6 ft  Activity Response Tolerated well  Mobility Referral Yes  $Mobility charge 1 Mobility   Pt was agreeable to mobility session. Tolerated transfer B>C well. Left pt in chair with all needs met.   Royetta Crochet Mobility Specialist Please contact via Solicitor or  Rehab office at [phone]

## 2023-02-16 NOTE — TOC Transition Note (Signed)
Transition of Care [facility]) - CM/SW Discharge Note   Patient Details  Name: Robin Malone MRN: [id number] Date of Birth: 1934/01/10  [facility]) CM/SW Contact:  Boneta Lucks, RN Phone Number: 02/16/2023, 12:13 PM   Clinical Narrative:   Insurance approved 3/4 - 3/6, next review 3/6, navi auth id [id number], plan auth id [id number] for [facility] Kerri updated TOC and ready, she will call daughter, Freida Busman.  COVID test ordered. RN will call report when COVID test has resulted.   Final next level of care: Skilled Nursing Facility Barriers to Discharge: Barriers Resolved   Patient Goals and CMS Choice CMS Medicare.gov Compare Post Acute Care list provided to:: Other (Comment Required) Choice offered to / list presented to : Adult Children  Discharge Placement          Patient to be transferred to facility by: [facility] staff   Patient and family notified of of transfer: 02/16/23  Discharge Plan and Services Additional resources added to the After Visit Summary for         Social Determinants of Health (SDOH) Interventions SDOH Screenings   Food Insecurity: No Food Insecurity (02/12/2023)  Housing: Low Risk  (02/12/2023)  Transportation Needs: No Transportation Needs (02/12/2023)  Utilities: Not At Risk (02/12/2023)  Tobacco Use: Medium Risk (02/11/2023)

## 2023-02-16 NOTE — Progress Notes (Signed)
Nsg Discharge Note  Admit Date:  02/11/2023 Discharge date: 02/16/2023   [name] to be D/C'd Skilled nursing facility per MD order.  AVS completed.  Patient/caregiver able to verbalize understanding.  Discharge Medication: Allergies as of 02/16/2023       Reactions   Codeine Nausea And Vomiting   Levofloxacin Other (See Comments)   Arthralgias    Norco [hydrocodone-acetaminophen] Nausea And Vomiting   Ultram [tramadol] Nausea And Vomiting   Vibra-tab [doxycycline] Rash        Medication List     STOP taking these medications    gabapentin 300 MG capsule Commonly known as: NEURONTIN   gabapentin 600 MG tablet Commonly known as: NEURONTIN   ibuprofen 200 MG tablet Commonly known as: ADVIL       TAKE these medications    acetaminophen 500 MG tablet Commonly known as: TYLENOL Take 1,000 mg by mouth 3 (three) times daily.   apixaban 5 MG Tabs tablet Commonly known as: ELIQUIS Take 1 tablet (5 mg total) by mouth 2 (two) times daily.   fluticasone 50 MCG/ACT nasal spray Commonly known as: FLONASE Place 1 spray into both nostrils at bedtime.   levothyroxine 75 MCG tablet Commonly known as: SYNTHROID Take 75 mcg by mouth at bedtime.   lidocaine 5 % Commonly known as: LIDODERM Place 2 patches onto the skin daily. Remove & Discard patch within 12 hours or as directed by MD Start taking on: February 17, 2023   lisinopril-hydrochlorothiazide 20-12.5 MG tablet Commonly known as: ZESTORETIC Take 1 tablet by mouth daily.   lovastatin 40 MG tablet Commonly known as: MEVACOR Take 80 mg by mouth at bedtime.   melatonin 3 MG Tabs tablet Take 1 tablet (3 mg total) by mouth at bedtime.   methocarbamol 500 MG tablet Commonly known as: ROBAXIN Take 1 tablet (500 mg total) by mouth every 8 (eight) hours as needed for muscle spasms.   metoprolol tartrate 25 MG tablet Commonly known as: LOPRESSOR Take 1 tablet (25 mg total) by mouth 2 (two) times daily.    montelukast 10 MG tablet Commonly known as: SINGULAIR Take 10 mg by mouth at bedtime as needed (allergies).   omeprazole 40 MG capsule Commonly known as: PRILOSEC Take 40 mg by mouth at bedtime.   oxyCODONE 5 MG immediate release tablet Commonly known as: Oxy IR/ROXICODONE Take 1 tablet (5 mg total) by mouth every 6 (six) hours as needed for severe pain or breakthrough pain.   polyethylene glycol 17 g packet Commonly known as: MIRALAX / GLYCOLAX Take 17 g by mouth 2 (two) times daily.   senna-docusate 8.6-50 MG tablet Commonly known as: Senokot-S Take 1 tablet by mouth at bedtime.   VITAMIN B-12 PO Take 1 tablet by mouth daily.        Discharge Assessment: Vitals:   02/16/23 0435 02/16/23 1333  BP: (!) 128/56 (!) 108/42  Pulse: 65 74  Resp: 18   Temp: 98 F (36.7 C) 98.9 F (37.2 C)  SpO2: 99% 95%   Skin clean, dry and intact without evidence of skin break down, no evidence of skin tears noted. IV catheter discontinued intact. Site without signs and symptoms of complications - no redness or edema noted at insertion site, patient denies c/o pain - only slight tenderness at site.  Dressing with slight pressure applied.  D/c Instructions-Education: Discharge instructions given to patient/family with verbalized understanding. D/c education completed with patient/family including follow up instructions, medication list, d/c activities limitations if indicated, with  other d/c instructions as indicated by MD - patient able to verbalize understanding, all questions fully answered. Patient instructed to return to ED, call 911, or call MD for any changes in condition.  Patient escorted via [place], and D/C home via private auto.  Kathie Rhodes, RN 02/16/2023 1:48 PM

## 2023-02-17 ENCOUNTER — Encounter: Payer: Self-pay | Admitting: Adult Health

## 2023-02-17 ENCOUNTER — Non-Acute Institutional Stay (SKILLED_NURSING_FACILITY): Payer: Medicare HMO | Admitting: Adult Health

## 2023-02-17 DIAGNOSIS — M4316 Spondylolisthesis, lumbar region: Secondary | ICD-10-CM

## 2023-02-17 DIAGNOSIS — J3089 Other allergic rhinitis: Secondary | ICD-10-CM | POA: Insufficient documentation

## 2023-02-17 DIAGNOSIS — K219 Gastro-esophageal reflux disease without esophagitis: Secondary | ICD-10-CM

## 2023-02-17 DIAGNOSIS — I4891 Unspecified atrial fibrillation: Secondary | ICD-10-CM

## 2023-02-17 DIAGNOSIS — M7542 Impingement syndrome of left shoulder: Secondary | ICD-10-CM

## 2023-02-17 DIAGNOSIS — K5909 Other constipation: Secondary | ICD-10-CM

## 2023-02-17 DIAGNOSIS — M48061 Spinal stenosis, lumbar region without neurogenic claudication: Secondary | ICD-10-CM

## 2023-02-17 DIAGNOSIS — E039 Hypothyroidism, unspecified: Secondary | ICD-10-CM

## 2023-02-17 DIAGNOSIS — I10 Essential (primary) hypertension: Secondary | ICD-10-CM

## 2023-02-17 DIAGNOSIS — E782 Mixed hyperlipidemia: Secondary | ICD-10-CM | POA: Diagnosis not present

## 2023-02-17 DIAGNOSIS — I5030 Unspecified diastolic (congestive) heart failure: Secondary | ICD-10-CM | POA: Insufficient documentation

## 2023-02-17 DIAGNOSIS — I5032 Chronic diastolic (congestive) heart failure: Secondary | ICD-10-CM

## 2023-02-17 NOTE — Progress Notes (Signed)
Location:  [facility] Room Number: SO/129/P Place of Service:  SNF (31)   CODE STATUS: DNR  Allergies  Allergen Reactions   Codeine Nausea And Vomiting   Levofloxacin Other (See Comments)    Arthralgias    Norco [Hydrocodone-Acetaminophen] Nausea And Vomiting   Ultram [Tramadol] Nausea And Vomiting   Vibra-Tab [Doxycycline] Rash    Chief Complaint  Patient presents with   Hospitalization Follow-up    HPI:  She is a 89 year old woman who has been hospitalized from 02-11-23 through 02-16-23.  Her medical history includes: chronic back pain: status post radioactive ablation of spinal nerves and an L4-L5 fusion in 2003. She presented to the ED with worsening back pain for the past week feeling like a knife stabbing is she moved. She has ben taking 6 ibuprofen and 2 tylenol every 6 hours along with wine and vodka for pain management without success. Does consume wine routinely. She denied any numbness or paresthesias; she did note increased urge incontinence over the past several weeks.   In the ED she was found to be in afib with rvr for 45 minutes and remained asymptomatic. She did convert back to nsr. She was started on lovenox for anticoagulation. The TTE demonstrated grade 2 diastolic function with EF 55%.  Her pain was not well controlled with oxycodone,  robaxin and lidocaine patches. She did finish solu-medrol course for schmorl's inflammation.  She is here for short term rehab with her goal to return back home. She is complaining of nausea today. She states that if she is still she does not hurt. She states the pain starts in her lower back and goes to her left shoulder (does have impingement). She will continue to be followed for her chronic illnesses including:  Primary hypertension:Chronic non-seasonal allergic rhinitis: GERD without esophagitis:Chronic constipation:   Past Medical History:  Diagnosis Date   Chronic back pain    GERD (gastroesophageal reflux  disease)    Hypercholesteremia    Hypertension    Thyroid disease     Past Surgical History:  Procedure Laterality Date   BACK SURGERY     Capsulotomy MPJ Right 10/01/2016   RT #2 Toe   Hammer Toe Repair Right 10/01/2016   RT #2 Toe    Social History   Socioeconomic History   Marital status: Married    Spouse name: Not on file   Number of children: Not on file   Years of education: Not on file   Highest education level: Not on file  Occupational History   Not on file  Tobacco Use   Smoking status: Former   Smokeless tobacco: Never  Vaping Use   Vaping Use: Never used  Substance and Sexual Activity   Alcohol use: Yes    Alcohol/week: 0.0 standard drinks of alcohol   Drug use: Never   Sexual activity: Not Currently  Other Topics Concern   Not on file  Social History Narrative   Not on file   Social Determinants of Health   Financial Resource Strain: Not on file  Food Insecurity: No Food Insecurity (02/12/2023)   Hunger Vital Sign    Worried About Running Out of Food in the Last Year: Never true    Ran Out of Food in the Last Year: Never true  Transportation Needs: No Transportation Needs (02/12/2023)   PRAPARE - Hydrologist (Medical): No    Lack of Transportation (Non-Medical): No  Physical Activity: Not  on file  Stress: Not on file  Social Connections: Not on file  Intimate Partner Violence: Not At Risk (02/12/2023)   Humiliation, Afraid, Rape, and Kick questionnaire    Fear of Current or Ex-Partner: No    Emotionally Abused: No    Physically Abused: No    Sexually Abused: No   History reviewed. No pertinent family history.    VITAL SIGNS BP 139/70   Pulse 66   Temp 98.5 F (36.9 C)   Resp 18   Ht '5\' 3"'$  (1.6 m)   Wt 178 lb (80.7 kg)   SpO2 91%   BMI 31.53 kg/m   Outpatient Encounter Medications as of 02/17/2023  Medication Sig   acetaminophen (TYLENOL) 500 MG tablet Take 1,000 mg by mouth 3 (three) times daily.    apixaban (ELIQUIS) 5 MG TABS tablet Take 1 tablet (5 mg total) by mouth 2 (two) times daily.   Cyanocobalamin (VITAMIN B-12 PO) Take 1 tablet by mouth daily.   fluticasone (FLONASE) 50 MCG/ACT nasal spray Place 1 spray into both nostrils at bedtime.   levothyroxine (SYNTHROID, LEVOTHROID) 75 MCG tablet Take 75 mcg by mouth at bedtime.   lidocaine (LIDODERM) 5 % Place 2 patches onto the skin daily. Remove & Discard patch within 12 hours or as directed by MD   lisinopril-hydrochlorothiazide (PRINZIDE,ZESTORETIC) 20-12.5 MG per tablet Take 1 tablet by mouth daily.   lovastatin (MEVACOR) 40 MG tablet Take 80 mg by mouth at bedtime.   melatonin 3 MG TABS tablet Take 1 tablet (3 mg total) by mouth at bedtime.   methocarbamol (ROBAXIN) 500 MG tablet Take 1 tablet (500 mg total) by mouth every 8 (eight) hours as needed for muscle spasms.   metoprolol tartrate (LOPRESSOR) 25 MG tablet Take 1 tablet (25 mg total) by mouth 2 (two) times daily.   montelukast (SINGULAIR) 10 MG tablet Take 10 mg by mouth at bedtime as needed (allergies).   omeprazole (PRILOSEC) 40 MG capsule Take 40 mg by mouth at bedtime.   oxyCODONE (OXY IR/ROXICODONE) 5 MG immediate release tablet Take 1 tablet (5 mg total) by mouth every 6 (six) hours as needed for severe pain or breakthrough pain.   polyethylene glycol (MIRALAX / GLYCOLAX) 17 g packet Take 17 g by mouth 2 (two) times daily.   senna-docusate (SENOKOT-S) 8.6-50 MG tablet Take 1 tablet by mouth at bedtime.   UNABLE TO FIND Diet - Liquids: ___ Regular/Diet: Mechanical soft - chopped   No facility-administered encounter medications on file as of 02/17/2023.     SIGNIFICANT DIAGNOSTIC EXAMS  TODAY  02-12-23: 2-d echo:   1. Left ventricular ejection fraction, by estimation, is 55%. The left  ventricle has normal function. The left ventricle has no regional wall  motion abnormalities. Left ventricular diastolic parameters are consistent  with Grade II diastolic  dysfunction(pseudonormalization).   2. Right ventricular systolic function is normal. The right ventricular  size is mildly enlarged. There is mildly elevated pulmonary artery  systolic pressure. The estimated right ventricular systolic pressure is  123XX123 mmHg.   3. Left atrial size was mildly dilated.   4. The mitral valve is grossly normal. Trivial mitral valve  regurgitation. No evidence of mitral stenosis.   5. The aortic valve is grossly normal. There is mild thickening of the  aortic valve. Aortic valve regurgitation is not visualized. No aortic  stenosis is present.   6. The inferior vena cava is normal in size with greater than 50%  respiratory variability, suggesting  right atrial pressure of 3 mmHg.  02-14-23: MRI lumbar spine:  1. No acute abnormality of the lumbar spine. 2. Progressive superior and inferior endplate Schmorl's nodes at T11-T12 with new prominent reactive marrow edema, which can be a source of pain. 3. Unchanged multilevel lumbar spondylosis as described above, worst at L1-L2 and L2-L3 where there is moderate to severe spinal canal stenosis.  Review of Systems  Constitutional:  Negative for malaise/fatigue.  Respiratory:  Negative for cough and shortness of breath.   Cardiovascular:  Negative for chest pain, palpitations and leg swelling.  Gastrointestinal:  Positive for nausea. Negative for abdominal pain, constipation, heartburn and vomiting.  Musculoskeletal:  Positive for back pain. Negative for joint pain and myalgias.  Skin: Negative.   Neurological:  Negative for dizziness.  Psychiatric/Behavioral:  The patient is not nervous/anxious.    Physical Exam Constitutional:      General: She is not in acute distress.    Appearance: She is well-developed. She is not diaphoretic.  Neck:     Thyroid: No thyromegaly.  Cardiovascular:     Rate and Rhythm: Normal rate and regular rhythm.     Pulses: Normal pulses.     Heart sounds: Normal heart sounds.  Pulmonary:      Effort: Pulmonary effort is normal. No respiratory distress.     Breath sounds: Normal breath sounds.  Abdominal:     General: Bowel sounds are normal. There is no distension.     Palpations: Abdomen is soft.     Tenderness: There is no abdominal tenderness.  Musculoskeletal:        General: Normal range of motion.     Cervical back: Neck supple.     Right lower leg: No edema.     Left lower leg: No edema.  Lymphadenopathy:     Cervical: No cervical adenopathy.  Skin:    General: Skin is warm and dry.     Comments: Bilateral lower extremities with venous changes  Neurological:     Mental Status: She is alert and oriented to person, place, and time.  Psychiatric:        Mood and Affect: Mood normal.       ASSESSMENT/ PLAN:  TODAY  Spondylolisthesis L4-L5 level/ spinal stenosis of lumbar region without neurogenic claudication/impingement syndrome of left shoulder:  will continue therapy as directed to improve upon level of independence with adls. Will change to tylenol ER 650 mg every 6 hours; will restart gabapentin at 100 mg nightly; is unable to take meloxicam and cymbalta due to etoh use; and use of anticoagulation.  Has robaxin 500 me every 8 hours as needed; has oxycodone 5 mg every 6 hours as needed and is using 2 lidoderm patches to back   2. Mixed hyperlipidemia: will continue mevacor 80 mg daily   3. Acquired hypothyroidism: tsh 3.280 will continue synthroid 75 mcg daily   4. Atrial fibrillation with RVR: heart rate is stable will continue lopressor 25 mg twice daily for rate control and eliquis 5 mg twice daily   5. Primary hypertension: b/p 139/70; will continue zestoretic 20/12.5 mg daily; lopressor 25 mg twice daily   6. Chronic non-seasonal allergic rhinitis: will continue flomase nightly and singulair 10 mg daily as needed  7. GERD without esophagitis: will continue prilosec 40 mg nightly ; will begin zofran odt 8 mg ac/hs    8. Chronic constipation: will  continue miralax twice daily and senna s nightly  9. Chronic diastolic congestive heart failure EF  55%; is on lopressor 25 mg twice daily  10.  ETOH use: she used this to help manage her pain prior to her hospitalization.      Ok Edwards NP [facility]   call [phone]

## 2023-02-18 ENCOUNTER — Encounter: Payer: Self-pay | Admitting: Internal Medicine

## 2023-02-18 ENCOUNTER — Encounter: Payer: Self-pay | Admitting: Cardiology

## 2023-02-18 ENCOUNTER — Non-Acute Institutional Stay (SKILLED_NURSING_FACILITY): Payer: Medicare HMO | Admitting: Internal Medicine

## 2023-02-18 DIAGNOSIS — M48061 Spinal stenosis, lumbar region without neurogenic claudication: Secondary | ICD-10-CM | POA: Diagnosis not present

## 2023-02-18 DIAGNOSIS — M4316 Spondylolisthesis, lumbar region: Secondary | ICD-10-CM

## 2023-02-18 DIAGNOSIS — I4891 Unspecified atrial fibrillation: Secondary | ICD-10-CM | POA: Diagnosis not present

## 2023-02-18 DIAGNOSIS — I5031 Acute diastolic (congestive) heart failure: Secondary | ICD-10-CM | POA: Diagnosis not present

## 2023-02-18 NOTE — Progress Notes (Unsigned)
Cardiology Office Note  Date: 02/18/2023   ID: Robin Malone, DOB 1934-10-04, MRN [id number]  PCP: Verdell Carmine., MD  Chief Complaint: No chief complaint on file.  History of Present Illness: Robin Malone is an 89 y.o. female referred for cardiology consultation by Dr. Manuella Ghazi after recent hospitalization with severe acute on chronic back pain, during presentation noted to have a transient episode of atrial fibrillation with RVR lasting approximately 45 minutes by report.  Also noted to be hypokalemic during this time.  Per review of the chart, most of her hospital stay focused on determining pain control regimen for what looks to be fairly significant thoracic and lumbar spine disease, no acute fractures noted however.  Per history she had been using fairly high-dose NSAIDs at home and also drinking vodka to help control the pain.  TSH found to be normal and she had no evidence of ACS by high-sensitivity troponin I levels.  Echocardiogram revealed LVEF 55% with moderate diastolic dysfunction, mildly elevated estimated RVSP, mild left atrial enlargement, and no major valvular abnormalities.  She was discharged to SNF by Dr. Manuella Ghazi on metoprolol and Eliquis.  CHA2DS2-VASc score is 5.  Review of Systems: As outlined in the history of present illness.  Past Medical History: Past Medical History:  Diagnosis Date   Atrial fibrillation (HCC)    Chronic back pain    GERD (gastroesophageal reflux disease)    Hypertension    Hypothyroidism    Mixed hyperlipidemia    Past Surgical History: Past Surgical History:  Procedure Laterality Date   BACK SURGERY     Capsulotomy MPJ Right 10/01/2016   RT #2 Toe   Hammer Toe Repair Right 10/01/2016   RT #2 Toe   Family History: Family History  Problem Relation Age of Onset   Hypertension Father    Social History:  Social History   Tobacco Use   Smoking status: Former   Smokeless tobacco: Never  Substance Use Topics   Alcohol use: Yes     Alcohol/week: 0.0 standard drinks of alcohol   Medications: Current Outpatient Medications on File Prior to Visit  Medication Sig Dispense Refill   acetaminophen (TYLENOL) 500 MG tablet Take 1,000 mg by mouth 3 (three) times daily.     apixaban (ELIQUIS) 5 MG TABS tablet Take 1 tablet (5 mg total) by mouth 2 (two) times daily. 60 tablet 0   Cyanocobalamin (VITAMIN B-12 PO) Take 1 tablet by mouth daily.     fluticasone (FLONASE) 50 MCG/ACT nasal spray Place 1 spray into both nostrils at bedtime.     levothyroxine (SYNTHROID, LEVOTHROID) 75 MCG tablet Take 75 mcg by mouth at bedtime.     lidocaine (LIDODERM) 5 % Place 2 patches onto the skin daily. Remove & Discard patch within 12 hours or as directed by MD 30 patch 0   lisinopril-hydrochlorothiazide (PRINZIDE,ZESTORETIC) 20-12.5 MG per tablet Take 1 tablet by mouth daily.     lovastatin (MEVACOR) 40 MG tablet Take 80 mg by mouth at bedtime.     melatonin 3 MG TABS tablet Take 1 tablet (3 mg total) by mouth at bedtime. 30 tablet 0   methocarbamol (ROBAXIN) 500 MG tablet Take 1 tablet (500 mg total) by mouth every 8 (eight) hours as needed for muscle spasms. 20 tablet 0   metoprolol tartrate (LOPRESSOR) 25 MG tablet Take 1 tablet (25 mg total) by mouth 2 (two) times daily. 60 tablet 0   montelukast (SINGULAIR) 10 MG tablet Take 10 mg by mouth  at bedtime as needed (allergies).     omeprazole (PRILOSEC) 40 MG capsule Take 40 mg by mouth at bedtime.     oxyCODONE (OXY IR/ROXICODONE) 5 MG immediate release tablet Take 1 tablet (5 mg total) by mouth every 6 (six) hours as needed for severe pain or breakthrough pain. 10 tablet 0   polyethylene glycol (MIRALAX / GLYCOLAX) 17 g packet Take 17 g by mouth 2 (two) times daily. 14 each 0   senna-docusate (SENOKOT-S) 8.6-50 MG tablet Take 1 tablet by mouth at bedtime. 30 tablet 0   UNABLE TO FIND Diet - Liquids: ___ Regular/Diet: Mechanical soft - chopped     No current facility-administered medications on  file prior to visit.   Allergies: Allergies  Allergen Reactions   Codeine Nausea And Vomiting   Levofloxacin Other (See Comments)    Arthralgias    Norco [Hydrocodone-Acetaminophen] Nausea And Vomiting   Ultram [Tramadol] Nausea And Vomiting   Vibra-Tab [Doxycycline] Rash   Physical Exam: VS:  There were no vitals taken for this visit., BMI There is no height or weight on file to calculate BMI.  Wt Readings from Last 3 Encounters:  02/18/23 173 lb 9.6 oz (78.7 kg)  02/17/23 178 lb (80.7 kg)  02/11/23 159 lb (72.1 kg)    General: Patient appears comfortable at rest. HEENT: Conjunctiva and lids normal, oropharynx clear with moist mucosa. Neck: Supple, no elevated JVP or carotid bruits, no thyromegaly. Lungs: Clear to auscultation, nonlabored breathing at rest. Cardiac: Regular rate and rhythm, no S3 or significant systolic murmur, no pericardial rub. Abdomen: Soft, nontender, no hepatomegaly, bowel sounds present, no guarding or rebound. Extremities: No pitting edema, distal pulses 2+. Skin: Warm and dry. Musculoskeletal: No kyphosis. Neuropsychiatric: Alert and oriented x3, affect grossly appropriate.  ECG:  An ECG dated 02/12/2023 was personally reviewed today and demonstrated:  Sinus rhythm with nonspecific ST changes following spontaneous conversion from atrial fibrillation with RVR and more prominent nonspecific ST segment abnormalities.  Labwork: 02/12/2023: ALT 13; AST 21; TSH 3.280 02/15/2023: Hemoglobin 10.5; Platelets 190 02/16/2023: BUN 16; Creatinine, Ser 0.53; Magnesium 2.2; Potassium 3.8; Sodium 138   Other Studies Reviewed Today:  Echocardiogram 02/12/2023:  1. Left ventricular ejection fraction, by estimation, is 55%. The left  ventricle has normal function. The left ventricle has no regional wall  motion abnormalities. Left ventricular diastolic parameters are consistent  with Grade II diastolic dysfunction  (pseudonormalization).   2. Right ventricular systolic  function is normal. The right ventricular  size is mildly enlarged. There is mildly elevated pulmonary artery  systolic pressure. The estimated right ventricular systolic pressure is  123XX123 mmHg.   3. Left atrial size was mildly dilated.   4. The mitral valve is grossly normal. Trivial mitral valve  regurgitation. No evidence of mitral stenosis.   5. The aortic valve is grossly normal. There is mild thickening of the  aortic valve. Aortic valve regurgitation is not visualized. No aortic  stenosis is present.   6. The inferior vena cava is normal in size with greater than 50%  respiratory variability, suggesting right atrial pressure of 3 mmHg.   Assessment and Plan:    Disposition:  Follow up {follow up:15908}  Signed, Satira Sark, M.D., F.A.C.C.

## 2023-02-18 NOTE — Progress Notes (Signed)
NURSING HOME LOCATION:  [facility] ROOM NUMBER:  129 P  CODE STATUS: DNR   PCP: Ward Givens MD  This is a comprehensive admission note to this SNFperformed on this date less than 30 days from date of admission. Included are preadmission medical/surgical history; reconciled medication list; family history; social history and comprehensive review of systems.  Corrections and additions to the records were documented. Comprehensive physical exam was also performed. Additionally a clinical summary was entered for each active diagnosis pertinent to this admission in the Problem List to enhance continuity of care.  HPI: She was hospitalized 3/1 - 02/16/2023 with intractable low back pain.  The pain had been progressively worse over the past week PTA, described as "like a knife stabbing" with any mobilization.  The day of admission she was unable to stand from the couch.  The record states that she was taking 6 ibuprofen and 2 acetaminophen every 6 hours as well as drinking 1-2 glasses of wine and multiple shots of vodka daily for pain control without relief.  She had associated urge incontinence but denied frank urinary or stool incontinence.  She also denied any associated radicular quality or numbness or paresthesias. At admission she was noted to be in A-fib with RVR which lasted approximate 45 minutes.  She was asymptomatic and spontaneously converted to normal sinus rhythm.  This was in the context of potassium of 3.0 which was repleted.  Metoprolol was subsequently added for rate control. Lovenox was initiated for anticoagulation.  Subsequently this was transitioned to Eliquis.  TTE revealed grade 2 diastolic dysfunction with ejection fraction 55%. CT scan head revealed grade 2 spondylolisthesis at L4-5.  MRI 3/4 revealed Schmorl's nodes at T11-T12 and spondylosis worse at L1-2 and L2-3.  There was moderate-severe spinal cord stenosis present. Hydrocodone was ineffective  prompting initiation of as needed morphine and oxycodone as well as methocarbamol for muscle spasms.  Supportive care with topical lidocaine patches and heating pads were also employed.  A 3-day course of Solu-Medrol was employed for Schmorl's nodes inflammation.  Trazodone was initiated for insomnia.  She was transitioned to scheduled oxycodone as of 3/5.  Melatonin was added because of persistent insomnia.  There was gradual improvement in the pain control and insomnia.  She was able to ambulate with PT using a walker. Mild normochromic, normocytic anemia was noted with H/H varying from 10.1-11/32.4-34.3.  Other than mild hypocalcemia and CKD stage II; labs were otherwise normal. Dysuria was treated with Pyridium. PT/OT recommended SNF placement for rehab. Cardiology follow-up for the PAF with RVR was to be scheduled after discharge from the SNF.  Past medical and surgical history: Includes GERD, dyslipidemia, history of thyroid disease, and essential hypertension. Significant surgeries include history of spinal fusion @ L4-L5 and radioactive ablation of the spinal nerves in 2003.Marland Kitchen  Social history: Social alcohol; never smoker.  Family history: noncontributory due to advanced age.   Review of systems: Her adult son was present when I interviewed her.  According to him her husband died recently and her home in [place], [place] was sold and she moved to a guest house behind an adult daughter's home. She validates that she has been seen @ a pain management clinic in [place].  She states that they gave her Novocaine and steroid injections with only temporary relief. Her history as to how much and how long she has been taking NSAIDs and Tylenol is very vague and inconsistent.  At 1 point she told  me that she was taking Advil "intermittently" 4-5 tablets up to 2 times per day.  She then subsequently stated she had taken 5-6 tablets/day for the past 2 years.  She states that she typically would  drink 2-3 glasses of wine per day and have a vodka tonic. She describes nausea and dyspepsia.  She questions whether she may have had melena stools after taking MiraLAX. She does validate some urinary incontinence.  There is no stool incontinence or radicular quality to her pain.  When I asked about depression in the context of having lost her husband recently; her response was "I cry a lot." She also states that she has pain in her ankles when someone palpates them or grasps her ankles to move her.  Constitutional: No fever, significant weight change  Eyes: No redness, discharge, pain, vision change ENT/mouth: No nasal congestion, purulent discharge, earache, change in hearing, sore throat  Cardiovascular: No chest pain, palpitations, paroxysmal nocturnal dyspnea, claudication, edema  Respiratory: No cough, sputum production, hemoptysis, DOE, significant snoring, apnea  Gastrointestinal: No dysphagia, abdominal pain, vomiting, rectal bleeding Genitourinary: No dysuria, hematuria, pyuria, nocturia Dermatologic: No rash, pruritus, change in appearance of skin Neurologic: No dizziness, headache, syncope, seizures Endocrine: No change in hair/skin/nails, excessive thirst, excessive hunger, excessive urination  Hematologic/lymphatic: No significant bruising, lymphadenopathy, abnormal bleeding Allergy/immunology: No itchy/watery eyes, significant sneezing, urticaria, angioedema  Physical exam:  Pertinent or positive findings: Appears her age.  She was initially asleep without frank apnea, snoring, or hypopnea.  She did awaken easily.  She continued to yawn intermittently after awakening.  Hair is disheveled.  Eyebrows are decreased laterally.  She has bilateral ptosis.  Breath sounds are decreased.  Abdomen is protuberant.  Pedal pulses are decreased.  She has mixed arthritic changes, greatest in the DIP joints.  She has fusiform changes of her knees.  There is hypopigmented scarring over the left shin  as well as hyperpigmented changes over the shins.  There is trace edema at the sock line.  General appearance: Adequately nourished; no acute distress, increased work of breathing is present.   Lymphatic: No lymphadenopathy about the head, neck, axilla. Eyes: No conjunctival inflammation or lid edema is present. There is no scleral icterus. Ears:  External ear exam shows no significant lesions or deformities.   Nose:  External nasal examination shows no deformity or inflammation. Nasal mucosa are pink and moist without lesions, exudates Oral exam: Lips and gums are healthy appearing.There is no oropharyngeal erythema or exudate. Neck:  No thyromegaly, masses, tenderness noted.    Heart:  Normal rate and regular rhythm. S1 and S2 normal without gallop, murmur, click, rub.  Lungs:  without wheezes, rhonchi, rales, rubs. Abdomen: Bowel sounds are normal.  Abdomen is soft and nontender with no organomegaly, hernias, masses. GU: Deferred  Extremities:  No cyanosis, clubbing. Neurologic exam: Balance, Rhomberg, finger to nose testing could not be completed due to clinical state Skin: Warm & dry w/o tenting.  See clinical summary under each active problem in the Problem List with associated updated therapeutic plan

## 2023-02-18 NOTE — Assessment & Plan Note (Signed)
Reinitiate gabapentin and add duloxetine with titration as clinically indicated. Duloxetine prescribed in attempt to treat both the neuropathic pain as well as possible depression.  She has apparently recently lost her husband and she states "I cry a lot."

## 2023-02-18 NOTE — Assessment & Plan Note (Signed)
CHF clinically compensated; no NVD or significant peripheral edema. No change in present cardiac regimen indicated.  

## 2023-02-18 NOTE — Assessment & Plan Note (Addendum)
Consider neurosurgical referral if there is suboptimal response from pain clinic interventions.

## 2023-02-18 NOTE — Patient Instructions (Signed)
See assessment and plan under each diagnosis in the problem list and acutely for this visit 

## 2023-02-18 NOTE — Assessment & Plan Note (Signed)
Clinically she is in regular rhythm with well-controlled rate.  Eliquis and metoprolol be continued.  Cardiology follow-up is to be scheduled as an outpatient following discharge from the SNF.

## 2023-02-21 ENCOUNTER — Encounter: Payer: Self-pay | Admitting: Cardiology

## 2023-02-21 ENCOUNTER — Ambulatory Visit: Payer: Medicare HMO | Attending: Cardiology | Admitting: Cardiology

## 2023-02-21 VITALS — BP 102/55 | HR 62 | Ht 63.0 in | Wt 175.0 lb

## 2023-02-21 DIAGNOSIS — I10 Essential (primary) hypertension: Secondary | ICD-10-CM

## 2023-02-21 DIAGNOSIS — Z79899 Other long term (current) drug therapy: Secondary | ICD-10-CM | POA: Diagnosis not present

## 2023-02-21 DIAGNOSIS — I480 Paroxysmal atrial fibrillation: Secondary | ICD-10-CM | POA: Diagnosis not present

## 2023-02-21 MED ORDER — LISINOPRIL 10 MG PO TABS: 10.0000 mg | ORAL_TABLET | Freq: Every day | ORAL | 3 refills | Status: DC

## 2023-02-21 NOTE — Patient Instructions (Signed)
Medication Instructions:  Your physician has recommended you make the following change in your medication:  - Stop Lisinopril/HCTZ  - Start Lisinopril 10 mg tablet once daily  *If you need a refill on your cardiac medications before your next appointment, please call your pharmacy*   Lab Work: In 3 Months: - CBC - BMET If you have labs (blood work) drawn today and your tests are completely normal, you will receive your results only by: [place] (if you have MyChart) OR A paper copy in the mail If you have any lab test that is abnormal or we need to change your treatment, we will call you to review the results.   Testing/Procedures: None   Follow-Up: At [facility], you and your health needs are our priority.  As part of our continuing mission to provide you with exceptional heart care, we have created designated Provider Care Teams.  These Care Teams include your primary Cardiologist (physician) and Advanced Practice Providers (APPs -  Physician Assistants and Nurse Practitioners) who all work together to provide you with the care you need, when you need it.  We recommend signing up for the patient portal called "MyChart".  Sign up information is provided on this After Visit Summary.  MyChart is used to connect with patients for Virtual Visits (Telemedicine).  Patients are able to view lab/test results, encounter notes, upcoming appointments, etc.  Non-urgent messages can be sent to your provider as well.   To learn more about what you can do with MyChart, go to [email or web address].    Your next appointment:   3 month(s)  Provider:   Rozann Lesches, MD    Other Instructions

## 2023-02-22 ENCOUNTER — Other Ambulatory Visit: Payer: Self-pay | Admitting: Adult Health

## 2023-02-22 ENCOUNTER — Encounter: Payer: Self-pay | Admitting: Adult Health

## 2023-02-22 ENCOUNTER — Non-Acute Institutional Stay (SKILLED_NURSING_FACILITY): Payer: Medicare HMO | Admitting: Adult Health

## 2023-02-22 DIAGNOSIS — M4316 Spondylolisthesis, lumbar region: Secondary | ICD-10-CM

## 2023-02-22 DIAGNOSIS — M48061 Spinal stenosis, lumbar region without neurogenic claudication: Secondary | ICD-10-CM

## 2023-02-22 DIAGNOSIS — I4891 Unspecified atrial fibrillation: Secondary | ICD-10-CM | POA: Diagnosis not present

## 2023-02-22 MED ORDER — LEVOTHYROXINE SODIUM 75 MCG PO TABS: 75.0000 ug | ORAL_TABLET | Freq: Every day | ORAL | 0 refills | Status: DC

## 2023-02-22 MED ORDER — MONTELUKAST SODIUM 10 MG PO TABS: 10.0000 mg | ORAL_TABLET | Freq: Every evening | ORAL | 0 refills | Status: AC | PRN

## 2023-02-22 MED ORDER — LISINOPRIL 10 MG PO TABS: 10.0000 mg | ORAL_TABLET | Freq: Every day | ORAL | 0 refills | Status: DC

## 2023-02-22 MED ORDER — METOPROLOL TARTRATE 25 MG PO TABS: 25.0000 mg | ORAL_TABLET | Freq: Two times a day (BID) | ORAL | 0 refills | Status: DC

## 2023-02-22 MED ORDER — OMEPRAZOLE 40 MG PO CPDR: 40.0000 mg | DELAYED_RELEASE_CAPSULE | Freq: Every day | ORAL | 0 refills | Status: DC

## 2023-02-22 MED ORDER — LIDOCAINE 5 % EX PTCH: 2.0000 | MEDICATED_PATCH | CUTANEOUS | 0 refills | Status: DC

## 2023-02-22 MED ORDER — LOVASTATIN 40 MG PO TABS: 80.0000 mg | ORAL_TABLET | Freq: Every day | ORAL | 0 refills | Status: DC

## 2023-02-22 MED ORDER — METHOCARBAMOL 500 MG PO TABS: 500.0000 mg | ORAL_TABLET | Freq: Three times a day (TID) | ORAL | 0 refills | Status: DC | PRN

## 2023-02-22 MED ORDER — APIXABAN 5 MG PO TABS: 5.0000 mg | ORAL_TABLET | Freq: Two times a day (BID) | ORAL | 0 refills | Status: DC

## 2023-02-22 MED ORDER — GABAPENTIN 100 MG PO CAPS: 100.0000 mg | ORAL_CAPSULE | Freq: Every day | ORAL | 0 refills | Status: DC

## 2023-02-22 NOTE — Progress Notes (Signed)
Location:  [facility] Room Number: [phone] Place of Service:  SNF (31)   CODE STATUS: DNR  Allergies  Allergen Reactions   Codeine Nausea And Vomiting   Levofloxacin Other (See Comments)    Arthralgias    Norco [Hydrocodone-Acetaminophen] Nausea And Vomiting   Ultram [Tramadol] Nausea And Vomiting   Vibra-Tab [Doxycycline] Rash    Chief Complaint  Patient presents with   Discharge Note    Discharge.     HPI:  She is being discharged to home with home health for pt/ot. Will need a front wheel walker. Will need to follow up with her medical provider and will need to have her prescriptions written.  She had been hospitalized for stenosis of lumbar region with uncontrolled pain. She was admitted to this facility for short term rehab. She participated with PT/OT/ST. She is ready to return back home.   Past Medical History:  Diagnosis Date   Atrial fibrillation (HCC)    Chronic back pain    GERD (gastroesophageal reflux disease)    Hypertension    Hypothyroidism    Mixed hyperlipidemia     Past Surgical History:  Procedure Laterality Date   BACK SURGERY     Capsulotomy MPJ Right 10/01/2016   RT #2 Toe   Hammer Toe Repair Right 10/01/2016   RT #2 Toe    Social History   Socioeconomic History   Marital status: Married    Spouse name: Not on file   Number of children: Not on file   Years of education: Not on file   Highest education level: Not on file  Occupational History   Not on file  Tobacco Use   Smoking status: Former   Smokeless tobacco: Never  Vaping Use   Vaping Use: Never used  Substance and Sexual Activity   Alcohol use: Yes    Alcohol/week: 0.0 standard drinks of alcohol   Drug use: Never   Sexual activity: Not Currently  Other Topics Concern   Not on file  Social History Narrative   Not on file   Social Determinants of Health   Financial Resource Strain: Not on file  Food Insecurity: No Food Insecurity (02/12/2023)    Hunger Vital Sign    Worried About Running Out of Food in the Last Year: Never true    Ran Out of Food in the Last Year: Never true  Transportation Needs: No Transportation Needs (02/12/2023)   PRAPARE - Hydrologist (Medical): No    Lack of Transportation (Non-Medical): No  Physical Activity: Not on file  Stress: Not on file  Social Connections: Not on file  Intimate Partner Violence: Not At Risk (02/12/2023)   Humiliation, Afraid, Rape, and Kick questionnaire    Fear of Current or Ex-Partner: No    Emotionally Abused: No    Physically Abused: No    Sexually Abused: No   Family History  Problem Relation Age of Onset   Hypertension Father       VITAL SIGNS BP 123/64   Pulse 64   Temp (!) 97.4 F (36.3 C)   Resp 18   Ht '5\' 3"'$  (1.6 m)   Wt 173 lb 9.6 oz (78.7 kg)   SpO2 94%   BMI 30.75 kg/m   Outpatient Encounter Medications as of 02/22/2023  Medication Sig   acetaminophen (TYLENOL) 650 MG CR tablet Take 650 mg by mouth every 6 (six) hours as needed for pain.   apixaban (ELIQUIS)  5 MG TABS tablet Take 1 tablet (5 mg total) by mouth 2 (two) times daily.   Carboxymethylcellulose Sodium (THERATEARS) 0.25 % SOLN Use twice daily   Cyanocobalamin (VITAMIN B-12 PO) Take 1 tablet by mouth daily.   fluticasone (FLONASE) 50 MCG/ACT nasal spray Place 1 spray into both nostrils at bedtime.   gabapentin (NEURONTIN) 100 MG capsule Take 1 capsule (100 mg total) by mouth at bedtime.   levothyroxine (SYNTHROID) 75 MCG tablet Take 1 tablet (75 mcg total) by mouth at bedtime.   lidocaine (LIDODERM) 5 % Place 2 patches onto the skin daily. Remove & Discard patch within 12 hours or as directed by MD   lisinopril (ZESTRIL) 10 MG tablet Take 1 tablet (10 mg total) by mouth daily.   lovastatin (MEVACOR) 40 MG tablet Take 2 tablets (80 mg total) by mouth at bedtime.   melatonin 3 MG TABS tablet Take 1 tablet (3 mg total) by mouth at bedtime.   meloxicam (MOBIC) 15 MG  tablet Take 15 mg by mouth daily.   methocarbamol (ROBAXIN) 500 MG tablet Take 1 tablet (500 mg total) by mouth every 8 (eight) hours as needed for muscle spasms.   metoprolol tartrate (LOPRESSOR) 25 MG tablet Take 1 tablet (25 mg total) by mouth 2 (two) times daily.   montelukast (SINGULAIR) 10 MG tablet Take 1 tablet (10 mg total) by mouth at bedtime as needed (allergies).   omeprazole (PRILOSEC) 40 MG capsule Take 1 capsule (40 mg total) by mouth at bedtime.   ondansetron (ZOFRAN) 8 MG tablet Take by mouth 4 (four) times daily as needed for nausea or vomiting.   polyethylene glycol (MIRALAX / GLYCOLAX) 17 g packet Take 17 g by mouth 2 (two) times daily.   senna-docusate (SENOKOT-S) 8.6-50 MG tablet Take 1 tablet by mouth at bedtime.   traZODone (DESYREL) 50 MG tablet Take 50 mg by mouth at bedtime.   UNABLE TO FIND Diet - Liquids: ___ Regular/Diet: Mechanical soft - chopped   [DISCONTINUED] acetaminophen (TYLENOL) 500 MG tablet Take 1,000 mg by mouth 3 (three) times daily.   No facility-administered encounter medications on file as of 02/22/2023.     SIGNIFICANT DIAGNOSTIC EXAMS   TODAY  02-12-23: 2-d echo:   1. Left ventricular ejection fraction, by estimation, is 55%. The left  ventricle has normal function. The left ventricle has no regional wall  motion abnormalities. Left ventricular diastolic parameters are consistent  with Grade II diastolic dysfunction(pseudonormalization).   2. Right ventricular systolic function is normal. The right ventricular  size is mildly enlarged. There is mildly elevated pulmonary artery  systolic pressure. The estimated right ventricular systolic pressure is  123XX123 mmHg.   3. Left atrial size was mildly dilated.   4. The mitral valve is grossly normal. Trivial mitral valve  regurgitation. No evidence of mitral stenosis.   5. The aortic valve is grossly normal. There is mild thickening of the  aortic valve. Aortic valve regurgitation is not visualized. No  aortic  stenosis is present.   6. The inferior vena cava is normal in size with greater than 50%  respiratory variability, suggesting right atrial pressure of 3 mmHg.  02-14-23: MRI lumbar spine:  1. No acute abnormality of the lumbar spine. 2. Progressive superior and inferior endplate Schmorl's nodes at T11-T12 with new prominent reactive marrow edema, which can be a source of pain. 3. Unchanged multilevel lumbar spondylosis as described above, worst at L1-L2 and L2-L3 where there is moderate to severe spinal canal  stenosis.  Review of Systems  Constitutional:  Negative for malaise/fatigue.  Respiratory:  Negative for cough and shortness of breath.   Cardiovascular:  Negative for chest pain, palpitations and leg swelling.  Gastrointestinal:  Negative for abdominal pain, constipation and heartburn.  Musculoskeletal:  Negative for back pain, joint pain and myalgias.  Skin: Negative.   Neurological:  Negative for dizziness.  Psychiatric/Behavioral:  The patient is not nervous/anxious.    Physical Exam Constitutional:      General: She is not in acute distress.    Appearance: She is well-developed. She is not diaphoretic.  Neck:     Thyroid: No thyromegaly.  Cardiovascular:     Rate and Rhythm: Normal rate and regular rhythm.     Pulses: Normal pulses.     Heart sounds: Normal heart sounds.  Pulmonary:     Effort: Pulmonary effort is normal. No respiratory distress.     Breath sounds: Normal breath sounds.  Abdominal:     General: Bowel sounds are normal. There is no distension.     Palpations: Abdomen is soft.     Tenderness: There is no abdominal tenderness.  Musculoskeletal:        General: Normal range of motion.     Cervical back: Neck supple.     Right lower leg: No edema.     Left lower leg: No edema.  Lymphadenopathy:     Cervical: No cervical adenopathy.  Skin:    General: Skin is warm and dry.     Comments: Bilateral lower extremities with venous changes    Neurological:     Mental Status: She is alert and oriented to person, place, and time.  Psychiatric:        Mood and Affect: Mood normal.        ASSESSMENT/ PLAN:   Patient is being discharged with the following home health services:  pt/ot to evaluate and treat as indicated for gait balance strength adl training.   Patient is being discharged with the following durable medical equipment:  none needed   Patient has been advised to f/u with their PCP in 1-2 weeks to for a transitions of care visit.  Social services at their facility was responsible for arranging this appointment.  Pt was provided with adequate prescriptions of noncontrolled medications to reach the scheduled appointment .  For controlled substances, a limited supply was provided as appropriate for the individual patient.  If the pt normally receives these medications from a pain clinic or has a contract with another physician, these medications should be received from that clinic or physician only).    A 30 day supply of her prescription medications have been sent to [facility]  Time spent with patient: 40 minutes: dme; home health; medications.    Ok Edwards NP [facility]  call [phone]

## 2023-02-23 ENCOUNTER — Encounter: Payer: Self-pay | Admitting: Internal Medicine

## 2023-02-23 DIAGNOSIS — R29818 Other symptoms and signs involving the nervous system: Secondary | ICD-10-CM | POA: Insufficient documentation

## 2023-02-24 ENCOUNTER — Other Ambulatory Visit (HOSPITAL_COMMUNITY)
Admission: RE | Admit: 2023-02-24 | Discharge: 2023-02-24 | Disposition: A | Discharge status: Home / Self Care | Payer: Medicare HMO | Source: Skilled Nursing Facility | Attending: Adult Health | Admitting: Adult Health

## 2023-02-24 ENCOUNTER — Other Ambulatory Visit: Payer: Self-pay | Admitting: Adult Health

## 2023-02-24 DIAGNOSIS — I10 Essential (primary) hypertension: Secondary | ICD-10-CM | POA: Insufficient documentation

## 2023-02-24 LAB — CBC WITH DIFFERENTIAL/PLATELET
Abs Immature Granulocytes: 0.05 10*3/uL (ref 0.00–0.07)
Basophils Absolute: 0 10*3/uL (ref 0.0–0.1)
Basophils Relative: 0 %
Eosinophils Absolute: 0.2 10*3/uL (ref 0.0–0.5)
Eosinophils Relative: 3 %
HCT: 32.9 % — ABNORMAL LOW (ref 36.0–46.0)
Hemoglobin: 10.3 g/dL — ABNORMAL LOW (ref 12.0–15.0)
Immature Granulocytes: 1 %
Lymphocytes Relative: 35 %
Lymphs Abs: 2.8 10*3/uL (ref 0.7–4.0)
MCH: 35.8 pg — ABNORMAL HIGH (ref 26.0–34.0)
MCHC: 31.3 g/dL (ref 30.0–36.0)
MCV: 114.2 fL — ABNORMAL HIGH (ref 80.0–100.0)
Monocytes Absolute: 0.8 10*3/uL (ref 0.1–1.0)
Monocytes Relative: 10 %
Neutro Abs: 4 10*3/uL (ref 1.7–7.7)
Neutrophils Relative %: 51 %
Platelets: 210 10*3/uL (ref 150–400)
RBC: 2.88 MIL/uL — ABNORMAL LOW (ref 3.87–5.11)
RDW: 14.7 % (ref 11.5–15.5)
WBC Morphology: REACTIVE
WBC: 7.8 10*3/uL (ref 4.0–10.5)
nRBC: 0 % (ref 0.0–0.2)

## 2023-02-24 LAB — BASIC METABOLIC PANEL
Anion gap: 9 (ref 5–15)
BUN: 23 mg/dL (ref 8–23)
CO2: 25 mmol/L (ref 22–32)
Calcium: 8.3 mg/dL — ABNORMAL LOW (ref 8.9–10.3)
Chloride: 106 mmol/L (ref 98–111)
Creatinine, Ser: 0.93 mg/dL (ref 0.44–1.00)
GFR, Estimated: 60 mL/min — ABNORMAL LOW (ref >60)
Glucose, Bld: 82 mg/dL (ref 70–99)
Potassium: 3.5 mmol/L (ref 3.5–5.1)
Sodium: 140 mmol/L (ref 135–145)

## 2023-04-05 ENCOUNTER — Encounter: Payer: Self-pay | Admitting: *Deleted

## 2023-04-05 ENCOUNTER — Telehealth: Payer: Self-pay | Admitting: Cardiology

## 2023-04-05 NOTE — Telephone Encounter (Signed)
Pt c/o swelling: STAT is pt has developed SOB within 24 hours  If swelling, where is the swelling located? Both feet and ankles  How much weight have you gained and in what time span? No   Have you gained 3 pounds in a day or 5 pounds in a week? No   Do you have a log of your daily weights (if so, list)? No   Are you currently taking a fluid pill? Yes   Are you currently SOB? No   Have you traveled recently? No

## 2023-04-05 NOTE — Telephone Encounter (Signed)
Pt was seen last week by PCP and lab work was done and told patient to follow up with cardiology. Pt states that ankles and feet have been swollen since April 14. Denies weight gain, chest pain and SOB at this time. Pt [place] that [place] and [place]. Will request labs and office note from PCP.

## 2023-04-06 NOTE — Telephone Encounter (Signed)
Spoke with pt and medication list updated. Appt made with E.Peck, NP in the [place] office.

## 2023-04-06 NOTE — Telephone Encounter (Signed)
PCP is returning call, PCP stated that the pt's records are in care everywhere. Please advise.

## 2023-04-15 ENCOUNTER — Ambulatory Visit (INDEPENDENT_AMBULATORY_CARE_PROVIDER_SITE_OTHER): Payer: Medicare HMO | Admitting: Sports Medicine

## 2023-04-15 ENCOUNTER — Other Ambulatory Visit (INDEPENDENT_AMBULATORY_CARE_PROVIDER_SITE_OTHER): Payer: Medicare HMO

## 2023-04-15 DIAGNOSIS — M48061 Spinal stenosis, lumbar region without neurogenic claudication: Secondary | ICD-10-CM

## 2023-04-15 DIAGNOSIS — M1711 Unilateral primary osteoarthritis, right knee: Secondary | ICD-10-CM | POA: Diagnosis not present

## 2023-04-15 MED ORDER — GABAPENTIN 600 MG PO TABS
600.0000 mg | ORAL_TABLET | Freq: Three times a day (TID) | ORAL | 3 refills | Status: DC
Start: 2023-04-15 — End: 2023-09-19

## 2023-04-15 NOTE — Assessment & Plan Note (Addendum)
Karen Kitchens has multilevel lumbar spinal stenosis with multilevel facet arthritis. We have worked this up in the past, she has had multiple injections including transforaminal epidurals that she found extremely painful. She did not get sufficient relief from transforaminal's, she did have some facet injections that provided only about 50% relief, she also had a left SI joint injection that provided meager relief. Gabapentin seems to help more, she was started on 100 mg, recently increased to 300 mg twice daily. She has no sedation from this, so we will go up to 600 mg 2-3 times a day with a 4 to 6-week follow-up with me. As previous transforaminal epidurals have been excessively uncomfortable if medication fails I think we should try an interlaminar approach epidural at the L3-L4 level.

## 2023-04-15 NOTE — Assessment & Plan Note (Signed)
This is a very pleasant 89 year old female, known knee osteoarthritis, last injection was in October 2016 now with recurrence of pain, repeated today.

## 2023-04-15 NOTE — Progress Notes (Signed)
    Procedures performed today:    Procedure: Real-time Ultrasound Guided injection of the right knee Device: Samsung HS60  Verbal informed consent obtained.  Time-out conducted.  Noted no overlying erythema, induration, or other signs of local infection.  Skin prepped in a sterile fashion.  Local anesthesia: Topical Ethyl chloride.  With sterile technique and under real time ultrasound guidance: Effusion noted, 1 cc Kenalog 40, 2 cc lidocaine, 2 cc bupivacaine injected easily Completed without difficulty  Advised to call if fevers/chills, erythema, induration, drainage, or persistent bleeding.  Images permanently stored and available for review in PACS.  Impression: Technically successful ultrasound guided injection.  Independent interpretation of notes and tests performed by another provider:   Updated lumbar spine MRI personally reviewed, multilevel lumbar spinal stenosis, accentuated lordosis, severe multilevel facet arthritis.  Brief History, Exam, Impression, and Recommendations:    Osteoarthritis of right knee This is a very pleasant 89 year old female, known knee osteoarthritis, last injection was in October 2016 now with recurrence of pain, repeated today.  Lumbar spinal stenosis Karen Kitchens has multilevel lumbar spinal stenosis with multilevel facet arthritis. We have worked this up in the past, she has had multiple injections including transforaminal epidurals that she found extremely painful. She did not get sufficient relief from transforaminal's, she did have some facet injections that provided only about 50% relief, she also had a left SI joint injection that provided meager relief. Gabapentin seems to help more, she was started on 100 mg, recently increased to 300 mg twice daily. She has no sedation from this, so we will go up to 600 mg 2-3 times a day with a 4 to 6-week follow-up with me. As previous transforaminal epidurals have been excessively uncomfortable if  medication fails I think we should try an interlaminar approach epidural at the L3-L4 level.    ____________________________________________ Ihor Austin. Benjamin Stain, M.D., ABFM., CAQSM., AME. Primary Care and Sports Medicine [facility]  Adjunct Professor of Family Medicine  [employer]  Restaurant manager, fast food

## 2023-04-19 DIAGNOSIS — R600 Localized edema: Secondary | ICD-10-CM | POA: Insufficient documentation

## 2023-04-22 ENCOUNTER — Encounter: Payer: Self-pay | Admitting: Nurse Practitioner

## 2023-04-22 ENCOUNTER — Ambulatory Visit: Payer: Medicare HMO | Attending: Nurse Practitioner | Admitting: Nurse Practitioner

## 2023-04-22 VITALS — BP 120/70 | HR 64 | Ht 63.0 in | Wt 173.6 lb

## 2023-04-22 DIAGNOSIS — I10 Essential (primary) hypertension: Secondary | ICD-10-CM | POA: Diagnosis not present

## 2023-04-22 DIAGNOSIS — I480 Paroxysmal atrial fibrillation: Secondary | ICD-10-CM

## 2023-04-22 DIAGNOSIS — E785 Hyperlipidemia, unspecified: Secondary | ICD-10-CM | POA: Diagnosis not present

## 2023-04-22 DIAGNOSIS — I872 Venous insufficiency (chronic) (peripheral): Secondary | ICD-10-CM

## 2023-04-22 DIAGNOSIS — I5030 Unspecified diastolic (congestive) heart failure: Secondary | ICD-10-CM | POA: Diagnosis not present

## 2023-04-22 DIAGNOSIS — R600 Localized edema: Secondary | ICD-10-CM

## 2023-04-22 MED ORDER — DAPAGLIFLOZIN PROPANEDIOL 10 MG PO TABS: 10.0000 mg | ORAL_TABLET | Freq: Every day | ORAL | 11 refills | Status: DC

## 2023-04-22 MED ORDER — FUROSEMIDE 20 MG PO TABS: 20.0000 mg | ORAL_TABLET | Freq: Every day | ORAL | 11 refills | Status: DC

## 2023-04-22 NOTE — Progress Notes (Signed)
Office Visit    Patient Name: Robin Malone Date of Encounter: 04/22/2023  PCP:  Raynelle Jan., MD   [facility]  Cardiologist:  Nona Dell, MD *** Advanced Practice Provider:  No care team member to display Electrophysiologist:  None  {Press F2 to show EP APP, CHF, sleep or structural heart MD               :[id number]}  { Click here to update then REFRESH NOTE - MD (PCP) or APP (Team Member)  Change PCP Type for MD, Specialty for APP is either Cardiology or Clinical Cardiac Electrophysiology  :[id number]}  Chief Complaint    Robin Malone is a 89 y.o. female with a hx of HFpEF, paroxysmal atrial fibrillation, hypertension, hypothyroidism, mixed hyperlipidemia, chronic back pain, and GERD, who presents today for scheduled follow-up.  Past Medical History    Past Medical History:  Diagnosis Date   Atrial fibrillation (HCC)    Chronic back pain    GERD (gastroesophageal reflux disease)    Hypertension    Hypothyroidism    Mixed hyperlipidemia    Past Surgical History:  Procedure Laterality Date   BACK SURGERY     Capsulotomy MPJ Right 10/01/2016   RT #2 Toe   Hammer Toe Repair Right 10/01/2016   RT #2 Toe    Allergies  Allergies  Allergen Reactions   Codeine Nausea And Vomiting   Levofloxacin Other (See Comments)    Arthralgias    Norco [Hydrocodone-Acetaminophen] Nausea And Vomiting   Ultram [Tramadol] Nausea And Vomiting   Vibra-Tab [Doxycycline] Rash    History of Present Illness    Robin Malone is a 89 y.o. female with a PMH as mentioned above. Was hospitalized in early March 2024 for back pain.  Was found to be in A-fib with RVR was found to be transient, lasted around 45 minutes, remained asymptomatic.  TSH was normal, no evidence of ACS.  Echocardiogram revealed EF 55%, grade 2 DD, mildly elevated estimated RVSP, no major valvular abnormalities, mild LAE.  Was discharged to SNF on Eliquis and metoprolol.  Last seen by  Dr. Diona Browner on February 21, 2023.  She was undergoing rehab at the [facility] at the time.  Denied any sense of palpitations.  Blood pressure was low normal that day.  Was tolerating Eliquis well.  She was cautioned about further use of NSAIDs.  Lisinopril with hydrochlorothiazide was stopped and replaced with lisinopril 10 mg daily to help improve blood pressure.  She contacted our office at the end of April 2024 noting leg and ankle swelling since April 14.  Dr. Diona Browner recommended to follow-up in office as there seem to be a discrepancy with her medications.  Wanted her to be seen in office and evaluated due to her having diastolic dysfunction and a tendency for volume overload with HFpEF.  She presents for office evaluation.  She states  EKGs/Labs/Other Studies Reviewed:   The following studies were reviewed today: ***  EKG:  EKG is *** ordered today.  The ekg ordered today demonstrates ***  Recent Labs: 02/12/2023: ALT 13; TSH 3.280 02/16/2023: Magnesium 2.2 02/24/2023: BUN 23; Creatinine, Ser 0.93; Hemoglobin 10.3; Platelets 210; Potassium 3.5; Sodium 140  Recent Lipid Panel No results found for: "CHOL", "TRIG", "HDL", "CHOLHDL", "VLDL", "LDLCALC", "LDLDIRECT"  Risk Assessment/Calculations:  {Does this patient have ATRIAL FIBRILLATION?:[phone]}  Home Medications   No outpatient medications have been marked as taking for the 04/22/23 encounter (Appointment) with Sharlene Dory, NP.  Review of Systems   ***   All other systems reviewed and are otherwise negative except as noted above.  Physical Exam    VS:  There were no vitals taken for this visit. , BMI There is no height or weight on file to calculate BMI.  Wt Readings from Last 3 Encounters:  02/22/23 173 lb 9.6 oz (78.7 kg)  02/21/23 175 lb (79.4 kg)  02/18/23 173 lb 9.6 oz (78.7 kg)     GEN: Well nourished, well developed, in no acute distress. HEENT: normal. Neck: Supple, no JVD, carotid bruits, or  masses. Cardiac: ***RRR, no murmurs, rubs, or gallops. No clubbing, cyanosis, edema.  ***Radials/PT 2+ and equal bilaterally.  Respiratory:  ***Respirations regular and unlabored, clear to auscultation bilaterally. GI: Soft, nontender, nondistended. MS: No deformity or atrophy. Skin: Warm and dry, no rash. Neuro:  Strength and sensation are intact. Psych: Normal affect.  Assessment & Plan    ***  {Are you ordering a CV Procedure (e.g. stress test, cath, DCCV, TEE, etc)?   Press F2        :[id number]}      Disposition: Follow up {follow up:15908} with Nona Dell, MD or APP.  Signed, Sharlene Dory, NP 04/22/2023, 1:04 PM [facility]

## 2023-04-22 NOTE — Patient Instructions (Signed)
Medication Instructions:   Start Farxiga 10 mg Daily   *If you need a refill on your cardiac medications before your next appointment, please call your pharmacy*   Lab Work: Your physician recommends that you return for lab work in: 1 week after starting Farxiga  Please have this done at [facility] (hours-Monday through Friday from 8:00 am to 4:00 pm except 11:30 am to 12:10 pm)   If you have labs (blood work) drawn today and your tests are completely normal, you will receive your results only by: MyChart Message (if you have MyChart) OR A paper copy in the mail If you have any lab test that is abnormal or we need to change your treatment, we will call you to review the results.   Testing/Procedures: NONE    Follow-Up: At [facility], you and your health needs are our priority.  As part of our continuing mission to provide you with exceptional heart care, we have created designated Provider Care Teams.  These Care Teams include your primary Cardiologist (physician) and Advanced Practice Providers (APPs -  Physician Assistants and Nurse Practitioners) who all work together to provide you with the care you need, when you need it.  We recommend signing up for the patient portal called "MyChart".  Sign up information is provided on this After Visit Summary.  MyChart is used to connect with patients for Virtual Visits (Telemedicine).  Patients are able to view lab/test results, encounter notes, upcoming appointments, etc.  Non-urgent messages can be sent to your provider as well.   To learn more about what you can do with MyChart, go to [email or web address].    Your next appointment:   1 month(s)  Provider:   Sharlene Dory, NP    Other Instructions Thank you for choosing [facility]!

## 2023-05-02 ENCOUNTER — Other Ambulatory Visit (HOSPITAL_COMMUNITY)
Admission: RE | Admit: 2023-05-02 | Discharge: 2023-05-02 | Disposition: A | Discharge status: Home / Self Care | Payer: Medicare HMO | Source: Ambulatory Visit | Attending: Nurse Practitioner | Admitting: Nurse Practitioner

## 2023-05-02 DIAGNOSIS — I5030 Unspecified diastolic (congestive) heart failure: Secondary | ICD-10-CM | POA: Insufficient documentation

## 2023-05-02 LAB — BASIC METABOLIC PANEL
Anion gap: 12 (ref 5–15)
BUN: 23 mg/dL (ref 8–23)
CO2: 27 mmol/L (ref 22–32)
Calcium: 9.3 mg/dL (ref 8.9–10.3)
Chloride: 97 mmol/L — ABNORMAL LOW (ref 98–111)
Creatinine, Ser: 1.08 mg/dL — ABNORMAL HIGH (ref 0.44–1.00)
GFR, Estimated: 50 mL/min — ABNORMAL LOW (ref >60)
Glucose, Bld: 95 mg/dL (ref 70–99)
Potassium: 3.8 mmol/L (ref 3.5–5.1)
Sodium: 136 mmol/L (ref 135–145)

## 2023-05-18 ENCOUNTER — Ambulatory Visit (INDEPENDENT_AMBULATORY_CARE_PROVIDER_SITE_OTHER): Payer: Medicare HMO | Admitting: Sports Medicine

## 2023-05-18 ENCOUNTER — Telehealth: Payer: Self-pay | Admitting: Sports Medicine

## 2023-05-18 DIAGNOSIS — M1711 Unilateral primary osteoarthritis, right knee: Secondary | ICD-10-CM | POA: Diagnosis not present

## 2023-05-18 MED ORDER — DICLOFENAC SODIUM 1 % EX GEL
4.0000 g | Freq: Four times a day (QID) | CUTANEOUS | 11 refills | Status: AC
Start: 2023-05-18 — End: ?

## 2023-05-18 MED ORDER — TRAMADOL HCL 50 MG PO TABS
50.0000 mg | ORAL_TABLET | Freq: Three times a day (TID) | ORAL | 0 refills | Status: DC | PRN
Start: 2023-05-18 — End: 2023-05-31

## 2023-05-18 MED ORDER — ONDANSETRON 8 MG PO TBDP
ORAL_TABLET | ORAL | 11 refills | Status: DC
Start: 2023-05-18 — End: 2023-05-31

## 2023-05-18 NOTE — Progress Notes (Signed)
    Procedures performed today:    None.  Independent interpretation of notes and tests performed by another provider:   None.  Brief History, Exam, Impression, and Recommendations:    Osteoarthritis of right knee Pleasant 89 year old female, known knee osteoarthritis, we last did a steroid injection in May this year, unfortunately she did not have relief beyond a few days. We will going get her approved for viscosupplementation, I would also like her to use some tramadol with an antiemetic as well as topical Voltaren in the meantime, unable to use oral NSAIDs due to Eliquis therapy.    ____________________________________________ Ihor Austin. Benjamin Stain, M.D., ABFM., CAQSM., AME. Primary Care and Sports Medicine [facility]  Adjunct Professor of Family Medicine  [employer]  Restaurant manager, fast food

## 2023-05-18 NOTE — Assessment & Plan Note (Signed)
Pleasant 89 year old female, known knee osteoarthritis, we last did a steroid injection in May this year, unfortunately she did not have relief beyond a few days. We will going get her approved for viscosupplementation, I would also like her to use some tramadol with an antiemetic as well as topical Voltaren in the meantime, unable to use oral NSAIDs due to Eliquis therapy.

## 2023-05-18 NOTE — Telephone Encounter (Signed)
Orthovisc approval, right knee, x-ray confirmed osteoarthritis, failed physical therapy, analgesics, steroid injections.

## 2023-05-20 NOTE — Telephone Encounter (Signed)
PA information submitted via [email or web address] for Orthovisc Paperwork has been printed and given to Dr. T for signatures. Once obtained, information will be faxed to [employer] at [phone]  

## 2023-05-24 ENCOUNTER — Ambulatory Visit: Payer: Medicare HMO | Admitting: Nurse Practitioner

## 2023-05-24 ENCOUNTER — Ambulatory Visit: Payer: Medicare HMO | Admitting: Student

## 2023-05-25 NOTE — Telephone Encounter (Signed)
Heard back from orthovisc and she was approved all she needs to pay a 10 copay and I left a detailed VM for her to call back and get scheduled task complete.

## 2023-05-27 ENCOUNTER — Ambulatory Visit: Payer: Medicare HMO | Admitting: Sports Medicine

## 2023-05-31 ENCOUNTER — Ambulatory Visit: Payer: Medicare HMO | Attending: Student | Admitting: Nurse Practitioner

## 2023-05-31 VITALS — BP 115/70 | HR 81 | Ht 63.0 in | Wt 175.4 lb

## 2023-05-31 DIAGNOSIS — I872 Venous insufficiency (chronic) (peripheral): Secondary | ICD-10-CM

## 2023-05-31 DIAGNOSIS — I480 Paroxysmal atrial fibrillation: Secondary | ICD-10-CM

## 2023-05-31 DIAGNOSIS — I5030 Unspecified diastolic (congestive) heart failure: Secondary | ICD-10-CM

## 2023-05-31 DIAGNOSIS — R600 Localized edema: Secondary | ICD-10-CM

## 2023-05-31 DIAGNOSIS — I10 Essential (primary) hypertension: Secondary | ICD-10-CM | POA: Diagnosis not present

## 2023-05-31 DIAGNOSIS — Z79899 Other long term (current) drug therapy: Secondary | ICD-10-CM

## 2023-05-31 DIAGNOSIS — S81801A Unspecified open wound, right lower leg, initial encounter: Secondary | ICD-10-CM

## 2023-05-31 DIAGNOSIS — E785 Hyperlipidemia, unspecified: Secondary | ICD-10-CM | POA: Diagnosis not present

## 2023-05-31 NOTE — Progress Notes (Unsigned)
Office Visit    Patient Name: Robin Malone Date of Encounter: 05/31/2023 PCP:  Raynelle Jan., MD   [facility]  Cardiologist:  Nona Dell, MD  Advanced Practice Provider:  No care team member to display Electrophysiologist:  None   Chief Complaint    Robin Malone is a 89 y.o. female with a hx of HFpEF, paroxysmal atrial fibrillation, hypertension, hypothyroidism, mixed hyperlipidemia, chronic back pain, and GERD, who presents today for scheduled follow-up.  Past Medical History    Past Medical History:  Diagnosis Date   Atrial fibrillation (HCC)    Chronic back pain    GERD (gastroesophageal reflux disease)    Hypertension    Hypothyroidism    Mixed hyperlipidemia    Past Surgical History:  Procedure Laterality Date   BACK SURGERY     Capsulotomy MPJ Right 10/01/2016   RT #2 Toe   Hammer Toe Repair Right 10/01/2016   RT #2 Toe    Allergies  Allergies  Allergen Reactions   Codeine Nausea And Vomiting   Levofloxacin Other (See Comments)    Arthralgias    Norco [Hydrocodone-Acetaminophen] Nausea And Vomiting   Ultram [Tramadol] Nausea And Vomiting   Vibra-Tab [Doxycycline] Rash    History of Present Illness    Robin Malone is a 89 y.o. female with a PMH as mentioned above.  Was hospitalized in early March 2024 for back pain.  Was found to be in A-fib with RVR was found to be transient, lasted around 45 minutes, remained asymptomatic.  TSH was normal, no evidence of ACS.  Echocardiogram revealed EF 55%, grade 2 DD, mildly elevated estimated RVSP, no major valvular abnormalities, mild LAE.  Was discharged to SNF on Eliquis and metoprolol.  Last seen by Dr. Diona Browner on February 21, 2023.  She was undergoing rehab at the [facility] at the time.  Denied any sense of palpitations.  Blood pressure was low normal that day.  Was tolerating Eliquis well.  She was cautioned about further use of NSAIDs.  Lisinopril with hydrochlorothiazide  was stopped and replaced with lisinopril 10 mg daily to help improve blood pressure.  She presents for follow-up. Admits to recent right lower leg swelling and fluid drainage, denies any signs/symptoms of infection. Currently treating wound to right anteromedial leg with home remedy. Leg edema and weight are stable. Overall doing well from a cardiac perspective. Denies any chest pain, shortness of breath, palpitations, syncope, presyncope, dizziness, orthopnea, PND, acute bleeding, or claudication.  SH: Originally from [place]. Her daughter lives next door and has a Financial trader farm.   EKGs/Labs/Other Studies Reviewed:   The following studies were reviewed today:   EKG:  EKG is ordered today and demonstrates NSR, 80 bpm, RBBB, no acute ischemic changes.   Echo 02/2023: 1. Left ventricular ejection fraction, by estimation, is 55%. The left  ventricle has normal function. The left ventricle has no regional wall  motion abnormalities. Left ventricular diastolic parameters are consistent  with Grade II diastolic dysfunction  (pseudonormalization).   2. Right ventricular systolic function is normal. The right ventricular  size is mildly enlarged. There is mildly elevated pulmonary artery  systolic pressure. The estimated right ventricular systolic pressure is  39.2 mmHg.   3. Left atrial size was mildly dilated.   4. The mitral valve is grossly normal. Trivial mitral valve  regurgitation. No evidence of mitral stenosis.   5. The aortic valve is grossly normal. There is mild thickening of the  aortic valve. Aortic valve regurgitation is not visualized. No aortic  stenosis is present.   6. The inferior vena cava is normal in size with greater than 50%  respiratory variability, suggesting right atrial pressure of 3 mmHg.  Recent Labs: 02/12/2023: ALT 13; TSH 3.280 02/16/2023: Magnesium 2.2 02/24/2023: Hemoglobin 10.3; Platelets 210 05/02/2023: BUN 23; Creatinine, Ser 1.08; Potassium 3.8; Sodium  136  Recent Lipid Panel No results found for: "CHOL", "TRIG", "HDL", "CHOLHDL", "VLDL", "LDLCALC", "LDLDIRECT"  Risk Assessment/Calculations:   CHA2DS2-VASc Score = 5  This indicates a 7.2% annual risk of stroke. The patient's score is based upon: CHF History: 1 HTN History: 1 Diabetes History: 0 Stroke History: 0 Vascular Disease History: 0 Age Score: 2 Gender Score: 1   Home Medications   Current Meds  Medication Sig   acetaminophen (TYLENOL) 650 MG CR tablet Take 650 mg by mouth every 6 (six) hours as needed for pain.   apixaban (ELIQUIS) 5 MG TABS tablet Take 1 tablet (5 mg total) by mouth 2 (two) times daily.   B Complex Vitamins (B COMPLEX 100 PO) Take 1 tablet by mouth daily at 6 (six) AM.   Carboxymethylcellulose Sodium (THERATEARS) 0.25 % SOLN Use twice daily   Cyanocobalamin (VITAMIN B-12 PO) Take 1 tablet by mouth daily.   dapagliflozin propanediol (FARXIGA) 10 MG TABS tablet Take 1 tablet (10 mg total) by mouth daily before breakfast.   diclofenac Sodium (VOLTAREN) 1 % GEL Apply 4 g topically 4 (four) times daily. To affected joint.   ezetimibe (ZETIA) 10 MG tablet Take 10 mg by mouth daily.   fluticasone (FLONASE) 50 MCG/ACT nasal spray Place 1 spray into both nostrils at bedtime.   furosemide (LASIX) 20 MG tablet Take 1 tablet (20 mg total) by mouth daily.   gabapentin (NEURONTIN) 600 MG tablet Take 1 tablet (600 mg total) by mouth 3 (three) times daily.   levothyroxine (SYNTHROID) 75 MCG tablet Take 1 tablet (75 mcg total) by mouth at bedtime.   lisinopril (ZESTRIL) 20 MG tablet Take 20 mg by mouth daily.   lovastatin (MEVACOR) 40 MG tablet Take 2 tablets (80 mg total) by mouth at bedtime.   methocarbamol (ROBAXIN) 500 MG tablet Take 1 tablet (500 mg total) by mouth every 8 (eight) hours as needed for muscle spasms.   montelukast (SINGULAIR) 10 MG tablet Take 1 tablet (10 mg total) by mouth at bedtime as needed (allergies).   omeprazole (PRILOSEC) 40 MG capsule  Take 1 capsule (40 mg total) by mouth at bedtime.   polyethylene glycol (MIRALAX / GLYCOLAX) 17 g packet Take 17 g by mouth 2 (two) times daily. (Patient taking differently: Take 17 g by mouth daily as needed.)    Review of Systems    All other systems reviewed and are otherwise negative except as noted above.  Physical Exam    VS:  BP 115/70   Pulse 81   Ht 5\' 3"  (1.6 m)   Wt 175 lb 6.4 oz (79.6 kg)   SpO2 97%   BMI 31.07 kg/m  , BMI Body mass index is 31.07 kg/m.  Wt Readings from Last 3 Encounters:  05/31/23 175 lb 6.4 oz (79.6 kg)  04/22/23 173 lb 9.6 oz (78.7 kg)  02/22/23 173 lb 9.6 oz (78.7 kg)     GEN: Well nourished, well developed, in no acute distress. HEENT: normal. Neck: Supple, no JVD, carotid bruits, or masses. Cardiac: S1/S2, RRR, no murmurs, rubs, or gallops. No clubbing, cyanosis. Trace, nonpitting edema  to BLE. Radials/PT 2+ and equal bilaterally.  Respiratory:  Respirations regular and unlabored, clear to auscultation bilaterally. MS: No deformity or atrophy. Skin: Warm and dry, no rash, skin discoloration to BLE, wound with eschar tissue noted to right lower anteromedial side of leg. Neuro:  Strength and sensation are intact. Psych: Normal affect.  Assessment & Plan    HFpEF Etiology unclear. Stage C, NYHA class I. Echo 02/2023 revealed EF 55%, grade 2 DD. Euvolemic and well compensated on exam. Does note intermittent chronic leg edema, also has underlying chronic venous insufficiency (see below). Continue Lasix, Lisinopril, and Lopressor. Continue Farxiga, Lasix, and Lisinopril.  Low sodium diet, fluid restriction <2L, and daily weights encouraged. Educated to contact our office for weight gain of 2 lbs overnight or 5 lbs in one week. At next visit, will consider stopping Lisinopril for 3 days and then switch to [facility] if BP allows. Will obtain BMET.   PAF Denies any tachycardia or palpitations. HR well controlled. Continue Metoprolol. Continue Eliquis  5 mg BID for CHA2DS2-VASc score 5.  Currently on appropriate dosage, denies any bleeding issues. Heart healthy diet encouraged.   HTN BP well controlled. Discussed to monitor BP at home at least 2 hours after medications and sitting for 5-10 minutes. Continue current medication regimen. Heart healthy diet and regular cardiovascular exercise encouraged.   HLD Past LDL obtained with PCP 119, most likely elevated d/t discrepancy with her medications after d/c from SNF. Pt says her prescription of Lovastatin was clarified and fixed with PCP. Continue Lovastatin. Heart healthy diet and regular cardiovascular exercise encouraged.    Chronic venous insufficiency, leg edema, wound along right leg, medication management Stable, however fluid drainage from right lower leg. Discussed compression devices, however patient politely declines at this time. Will obtain BMET and if kidney function allows, will temporarily increase Lasix. Low salt, heart healthy diet encouraged. Wound along right lower leg along anteromedial side shows eschar tissue, dressed wound and covered with triple antibiotic during appt to prevent infection. Will also obtain CBC. Continue to follow with PCP.  Disposition: Follow up in 6 months with Nona Dell, MD or APP.  Signed, Sharlene Dory, NP 06/01/2023, 3:22 PM [facility]

## 2023-05-31 NOTE — Patient Instructions (Addendum)
Medication Instructions:   Continue all current medications.   Labwork:  BMET, CBC - orders given Please do in 1 week Office will contact with results via phone, letter or mychart.     Testing/Procedures:  none  Follow-Up:  6 months   Any Other Special Instructions Will Be Listed Below (If Applicable).   If you need a refill on your cardiac medications before your next appointment, please call your pharmacy.

## 2023-06-01 ENCOUNTER — Other Ambulatory Visit: Payer: Self-pay

## 2023-06-01 ENCOUNTER — Ambulatory Visit (INDEPENDENT_AMBULATORY_CARE_PROVIDER_SITE_OTHER): Payer: Medicare HMO | Admitting: Sports Medicine

## 2023-06-01 DIAGNOSIS — M1711 Unilateral primary osteoarthritis, right knee: Secondary | ICD-10-CM

## 2023-06-01 MED ORDER — HYDROCODONE-ACETAMINOPHEN 5-325 MG PO TABS: 1.0000 | ORAL_TABLET | Freq: Three times a day (TID) | ORAL | 0 refills | Status: DC | PRN

## 2023-06-01 NOTE — Progress Notes (Signed)
    Procedures performed today:    Procedure: Real-time Ultrasound Guided injection of the right knee Device: Samsung HS60  Verbal informed consent obtained.  Time-out conducted.  Noted no overlying erythema, induration, or other signs of local infection.  Skin prepped in a sterile fashion.  Local anesthesia: Topical Ethyl chloride.  With sterile technique and under real time ultrasound guidance: Mild effusion noted, 30 mg/2 mL of OrthoVisc (sodium hyaluronate) in a prefilled syringe was injected easily into the knee through a 22-gauge needle.  Completed without difficulty  Advised to call if fevers/chills, erythema, induration, drainage, or persistent bleeding.  Images permanently stored and available for review in PACS.  Impression: Technically successful ultrasound guided injection.  Independent interpretation of notes and tests performed by another provider:   None.  Brief History, Exam, Impression, and Recommendations:    Osteoarthritis of right knee Did not respond to steroid injection, today we started Orthovisc, return in 1 week for #2 of 4 right knee. She is getting some nausea with tramadol, will switch to hydrocodone with a Zofran chaser.    ____________________________________________ Ihor Austin. Benjamin Stain, M.D., ABFM., CAQSM., AME. Primary Care and Sports Medicine [facility]  Adjunct Professor of Family Medicine  [employer]  Restaurant manager, fast food

## 2023-06-01 NOTE — Assessment & Plan Note (Addendum)
Did not respond to steroid injection, today we started Orthovisc, return in 1 week for #2 of 4 right knee. She is getting some nausea with tramadol, will switch to hydrocodone with a Zofran chaser.

## 2023-06-02 ENCOUNTER — Other Ambulatory Visit (HOSPITAL_COMMUNITY)
Admission: RE | Admit: 2023-06-02 | Discharge: 2023-06-02 | Disposition: A | Discharge status: Home / Self Care | Payer: Medicare HMO | Source: Ambulatory Visit | Attending: Nurse Practitioner | Admitting: Nurse Practitioner

## 2023-06-02 DIAGNOSIS — I10 Essential (primary) hypertension: Secondary | ICD-10-CM | POA: Insufficient documentation

## 2023-06-02 DIAGNOSIS — Z79899 Other long term (current) drug therapy: Secondary | ICD-10-CM | POA: Diagnosis present

## 2023-06-02 DIAGNOSIS — I5030 Unspecified diastolic (congestive) heart failure: Secondary | ICD-10-CM | POA: Insufficient documentation

## 2023-06-02 LAB — BASIC METABOLIC PANEL
Anion gap: 10 (ref 5–15)
BUN: 25 mg/dL — ABNORMAL HIGH (ref 8–23)
CO2: 26 mmol/L (ref 22–32)
Calcium: 8.4 mg/dL — ABNORMAL LOW (ref 8.9–10.3)
Chloride: 103 mmol/L (ref 98–111)
Creatinine, Ser: 0.91 mg/dL (ref 0.44–1.00)
GFR, Estimated: >60 mL/min (ref >60)
Glucose, Bld: 100 mg/dL — ABNORMAL HIGH (ref 70–99)
Potassium: 3.6 mmol/L (ref 3.5–5.1)
Sodium: 139 mmol/L (ref 135–145)

## 2023-06-02 LAB — CBC
HCT: 35.2 % — ABNORMAL LOW (ref 36.0–46.0)
Hemoglobin: 11 g/dL — ABNORMAL LOW (ref 12.0–15.0)
MCH: 35.9 pg — ABNORMAL HIGH (ref 26.0–34.0)
MCHC: 31.3 g/dL (ref 30.0–36.0)
MCV: 115 fL — ABNORMAL HIGH (ref 80.0–100.0)
Platelets: 219 10*3/uL (ref 150–400)
RBC: 3.06 MIL/uL — ABNORMAL LOW (ref 3.87–5.11)
RDW: 15.9 % — ABNORMAL HIGH (ref 11.5–15.5)
WBC: 9.5 10*3/uL (ref 4.0–10.5)
nRBC: 0 % (ref 0.0–0.2)

## 2023-06-03 DIAGNOSIS — M1711 Unilateral primary osteoarthritis, right knee: Secondary | ICD-10-CM | POA: Diagnosis not present

## 2023-06-03 MED ORDER — HYALURONAN 30 MG/2ML IX SOSY
30.0000 mg | PREFILLED_SYRINGE | Freq: Once | INTRA_ARTICULAR | Status: AC
Start: 2023-06-03 — End: 2023-06-03
  Administered 2023-06-03: 30 mg via INTRA_ARTICULAR

## 2023-06-03 NOTE — Addendum Note (Signed)
Addended by: Carren Rang A on: 06/03/2023 01:17 PM   Modules accepted: Orders

## 2023-06-07 ENCOUNTER — Telehealth: Payer: Self-pay | Admitting: Cardiology

## 2023-06-07 DIAGNOSIS — R600 Localized edema: Secondary | ICD-10-CM

## 2023-06-07 NOTE — Telephone Encounter (Signed)
Pt returning nurses call regarding results. Please advise 

## 2023-06-07 NOTE — Telephone Encounter (Signed)
Called patient to let her know of lab results and changes in her Lasix for the next 3 days: Labs look good. Please have her take Lasix 20 mg twice a day for the next 3 days, then reduce to 20 mg daily. Please repeat BMET, Mag, and proBNP in 1 week. Diagnosis is for leg edema.   Patient verbalized understanding and sent PCP copy

## 2023-06-08 ENCOUNTER — Other Ambulatory Visit (INDEPENDENT_AMBULATORY_CARE_PROVIDER_SITE_OTHER): Payer: Medicare HMO

## 2023-06-08 ENCOUNTER — Ambulatory Visit (INDEPENDENT_AMBULATORY_CARE_PROVIDER_SITE_OTHER): Payer: Medicare HMO | Admitting: Sports Medicine

## 2023-06-08 DIAGNOSIS — M1711 Unilateral primary osteoarthritis, right knee: Secondary | ICD-10-CM | POA: Diagnosis not present

## 2023-06-08 NOTE — Progress Notes (Signed)
    Procedures performed today:    Procedure: Real-time Ultrasound Guided injection of the right knee Device: Samsung HS60  Verbal informed consent obtained.  Time-out conducted.  Noted no overlying erythema, induration, or other signs of local infection.  Skin prepped in a sterile fashion.  Local anesthesia: Topical Ethyl chloride.  With sterile technique and under real time ultrasound guidance: Mild effusion noted, 30 mg/2 mL of OrthoVisc (sodium hyaluronate) in a prefilled syringe was injected easily into the knee through a 22-gauge needle.  Completed without difficulty  Advised to call if fevers/chills, erythema, induration, drainage, or persistent bleeding.  Images permanently stored and available for review in PACS.  Impression: Technically successful ultrasound guided injection.  Independent interpretation of notes and tests performed by another provider:   None.  Brief History, Exam, Impression, and Recommendations:    Osteoarthritis of right knee Orthovisc 2 of 4 right knee, return in 1 week for #3 of 4.    ____________________________________________ Ihor Austin. Benjamin Stain, M.D., ABFM., CAQSM., AME. Primary Care and Sports Medicine [facility]  Adjunct Professor of Family Medicine  [employer]  Restaurant manager, fast food

## 2023-06-08 NOTE — Assessment & Plan Note (Signed)
Orthovisc 2 of 4 right knee, return in 1 week for #3 of 4. 

## 2023-06-10 ENCOUNTER — Telehealth: Payer: Self-pay | Admitting: Nurse Practitioner

## 2023-06-10 ENCOUNTER — Other Ambulatory Visit (HOSPITAL_COMMUNITY)
Admission: RE | Admit: 2023-06-10 | Discharge: 2023-06-10 | Disposition: A | Discharge status: Home / Self Care | Payer: Medicare HMO | Source: Ambulatory Visit | Attending: Nurse Practitioner | Admitting: Nurse Practitioner

## 2023-06-10 DIAGNOSIS — R600 Localized edema: Secondary | ICD-10-CM | POA: Diagnosis not present

## 2023-06-10 DIAGNOSIS — Z79899 Other long term (current) drug therapy: Secondary | ICD-10-CM

## 2023-06-10 DIAGNOSIS — I5030 Unspecified diastolic (congestive) heart failure: Secondary | ICD-10-CM

## 2023-06-10 LAB — BASIC METABOLIC PANEL
Anion gap: 11 (ref 5–15)
BUN: 16 mg/dL (ref 8–23)
CO2: 30 mmol/L (ref 22–32)
Calcium: 8.7 mg/dL — ABNORMAL LOW (ref 8.9–10.3)
Chloride: 96 mmol/L — ABNORMAL LOW (ref 98–111)
Creatinine, Ser: 0.89 mg/dL (ref 0.44–1.00)
GFR, Estimated: >60 mL/min (ref >60)
Glucose, Bld: 103 mg/dL — ABNORMAL HIGH (ref 70–99)
Potassium: 3.4 mmol/L — ABNORMAL LOW (ref 3.5–5.1)
Sodium: 137 mmol/L (ref 135–145)

## 2023-06-10 LAB — BRAIN NATRIURETIC PEPTIDE: B Natriuretic Peptide: 239 pg/mL — ABNORMAL HIGH (ref 0.0–100.0)

## 2023-06-10 LAB — MAGNESIUM: Magnesium: 2.1 mg/dL (ref 1.7–2.4)

## 2023-06-10 NOTE — Telephone Encounter (Signed)
Pt c/o medication issue:  1. Name of Medication: Furosemide  2. How are you currently taking this medication (dosage and times per day)? Takes 40 mg  a day for 3 days  3. Are you having a reaction (difficulty breathing--STAT)?   4. What is your medication issue? feet are still swollen always up to her knee- she says she goes to the [facility] doctor on Wednesday in [place]. She will get another gel injection for her knee. She says he said if her knee is still swollen, he will have to drain the fluid off her knee

## 2023-06-14 ENCOUNTER — Telehealth: Payer: Self-pay | Admitting: Cardiology

## 2023-06-14 MED ORDER — SPIRONOLACTONE 25 MG PO TABS: 12.5000 mg | ORAL_TABLET | Freq: Every day | ORAL | 1 refills | Status: DC

## 2023-06-14 MED ORDER — FUROSEMIDE 40 MG PO TABS: 40.0000 mg | ORAL_TABLET | Freq: Two times a day (BID) | ORAL | 3 refills | Status: DC

## 2023-06-14 NOTE — Telephone Encounter (Signed)
Jonelle Sidle, MD  to Sharlene Dory, NP     06/13/23 10:57 AM  That depends on whether the sports medicine specialist generally recommends holding anticoagulant or antiplatelet drugs prior to a gel injection.  I am not entirely certain about that. Sharlene Dory, NP  to [name], LPN  Jonelle Sidle, MD     06/13/23 10:31 AM  Reviewed recent labs.  Recommend taking Lasix 40 mg BID x 3 days, then reduce to Lasix 40 mg daily. Please also start Spironolactone 12.5 mg daily for diagnosis of HFpEF. In 7-10 days, please repeat BMET, proBNP, and Magnesium level.  She is on Eliquis for stroke/DVT prophylaxis. Will route note to Dr. Diona Browner to determine if he recommends holding Eliquis prior to gel injection with Sports Medicine MD.  Thanks!  Best, Sharlene Dory, NP

## 2023-06-14 NOTE — Telephone Encounter (Signed)
Pt returning nurses call regarding results. Please advise 

## 2023-06-14 NOTE — Telephone Encounter (Signed)
Patient informed and verbalized understanding of plan. 

## 2023-06-14 NOTE — Telephone Encounter (Signed)
See previous phone note for details 

## 2023-06-14 NOTE — Telephone Encounter (Signed)
Lab orders faxed to [facility]

## 2023-06-15 ENCOUNTER — Ambulatory Visit (INDEPENDENT_AMBULATORY_CARE_PROVIDER_SITE_OTHER): Payer: Medicare HMO | Admitting: Sports Medicine

## 2023-06-15 ENCOUNTER — Other Ambulatory Visit (INDEPENDENT_AMBULATORY_CARE_PROVIDER_SITE_OTHER): Payer: Medicare HMO

## 2023-06-15 DIAGNOSIS — M1711 Unilateral primary osteoarthritis, right knee: Secondary | ICD-10-CM

## 2023-06-15 DIAGNOSIS — M48061 Spinal stenosis, lumbar region without neurogenic claudication: Secondary | ICD-10-CM

## 2023-06-15 NOTE — Progress Notes (Signed)
    Procedures performed today:    Procedure: Real-time Ultrasound Guided injection of the right knee Device: Samsung HS60  Verbal informed consent obtained.  Time-out conducted.  Noted no overlying erythema, induration, or other signs of local infection.  Skin prepped in a sterile fashion.  Local anesthesia: Topical Ethyl chloride.  With sterile technique and under real time ultrasound guidance: Mild effusion noted, 30 mg/2 mL of OrthoVisc (sodium hyaluronate) in a prefilled syringe was injected easily into the knee through a 22-gauge needle.  Completed without difficulty  Advised to call if fevers/chills, erythema, induration, drainage, or persistent bleeding.  Images permanently stored and available for review in PACS.  Impression: Technically successful ultrasound guided injection.  Independent interpretation of notes and tests performed by another provider:   None.  Brief History, Exam, Impression, and Recommendations:    Osteoarthritis of right knee Orthovisc 3 of 4 right knee, return in 1 week for #4 of 4.  Lumbar spinal stenosis This pleasant 89 year old female would also like to discuss chronic low back pain, there is multilevel lumbar spinal stenosis with multilevel facet arthritis, she has had multiple interventions including transforaminal epidurals, interlaminar epidurals, facet radiofrequency ablations, SI joint injections, trigger point injections, nothing helped. The only thing that seems to have helped her pain is low-dose hydrocodone, I did advise her that we could not do this long-term but I would be happy to refer her to pain management. Continue Neurontin, return to see me as needed for this.  I spent 30 minutes of total time managing this patient today, this includes chart review, face to face, and non-face to face time.  This was separate from the time spent performing the injection.  ____________________________________________ Ihor Austin. Benjamin Stain, M.D.,  ABFM., CAQSM., AME. Primary Care and Sports Medicine [facility]  Adjunct Professor of Family Medicine  [place] [employer] [facility] [employer]  Restaurant manager, fast food

## 2023-06-15 NOTE — Assessment & Plan Note (Signed)
This pleasant 89 year old female would also like to discuss chronic low back pain, there is multilevel lumbar spinal stenosis with multilevel facet arthritis, she has had multiple interventions including transforaminal epidurals, interlaminar epidurals, facet radiofrequency ablations, SI joint injections, trigger point injections, nothing helped. The only thing that seems to have helped her pain is low-dose hydrocodone, I did advise her that we could not do this long-term but I would be happy to refer her to pain management. Continue Neurontin, return to see me as needed for this.

## 2023-06-15 NOTE — Assessment & Plan Note (Signed)
Orthovisc 3 of 4 right knee, return in 1 week for #4 of 4.  

## 2023-06-22 ENCOUNTER — Other Ambulatory Visit (HOSPITAL_COMMUNITY)
Admission: RE | Admit: 2023-06-22 | Discharge: 2023-06-22 | Disposition: A | Discharge status: Home / Self Care | Payer: Medicare HMO | Source: Ambulatory Visit | Attending: Nurse Practitioner | Admitting: Nurse Practitioner

## 2023-06-22 DIAGNOSIS — I5030 Unspecified diastolic (congestive) heart failure: Secondary | ICD-10-CM | POA: Insufficient documentation

## 2023-06-22 DIAGNOSIS — Z79899 Other long term (current) drug therapy: Secondary | ICD-10-CM | POA: Insufficient documentation

## 2023-06-22 LAB — BASIC METABOLIC PANEL
Anion gap: 13 (ref 5–15)
BUN: 16 mg/dL (ref 8–23)
CO2: 28 mmol/L (ref 22–32)
Calcium: 9.1 mg/dL (ref 8.9–10.3)
Chloride: 97 mmol/L — ABNORMAL LOW (ref 98–111)
Creatinine, Ser: 0.91 mg/dL (ref 0.44–1.00)
GFR, Estimated: >60 mL/min (ref >60)
Glucose, Bld: 104 mg/dL — ABNORMAL HIGH (ref 70–99)
Potassium: 3.5 mmol/L (ref 3.5–5.1)
Sodium: 138 mmol/L (ref 135–145)

## 2023-06-22 LAB — BRAIN NATRIURETIC PEPTIDE: B Natriuretic Peptide: 114 pg/mL — ABNORMAL HIGH (ref 0.0–100.0)

## 2023-06-22 LAB — MAGNESIUM: Magnesium: 2.2 mg/dL (ref 1.7–2.4)

## 2023-06-23 ENCOUNTER — Other Ambulatory Visit (INDEPENDENT_AMBULATORY_CARE_PROVIDER_SITE_OTHER): Payer: Medicare HMO

## 2023-06-23 ENCOUNTER — Ambulatory Visit (INDEPENDENT_AMBULATORY_CARE_PROVIDER_SITE_OTHER): Payer: Medicare HMO | Admitting: Sports Medicine

## 2023-06-23 DIAGNOSIS — M1711 Unilateral primary osteoarthritis, right knee: Secondary | ICD-10-CM

## 2023-06-23 MED ORDER — HYDROCODONE-ACETAMINOPHEN 5-325 MG PO TABS: 1.0000 | ORAL_TABLET | Freq: Three times a day (TID) | ORAL | 0 refills | Status: DC | PRN

## 2023-06-23 NOTE — Progress Notes (Signed)
    Procedures performed today:    Procedure: Real-time Ultrasound Guided injection of the right knee Device: Samsung HS60  Verbal informed consent obtained.  Time-out conducted.  Noted no overlying erythema, induration, or other signs of local infection.  Skin prepped in a sterile fashion.  Local anesthesia: Topical Ethyl chloride.  With sterile technique and under real time ultrasound guidance: Mild effusion noted, 30 mg/2 mL of OrthoVisc (sodium hyaluronate) in a prefilled syringe was injected easily into the knee through a 22-gauge needle.  Completed without difficulty  Advised to call if fevers/chills, erythema, induration, drainage, or persistent bleeding.  Images permanently stored and available for review in PACS.  Impression: Technically successful ultrasound guided injection.  Independent interpretation of notes and tests performed by another provider:   None.  Brief History, Exam, Impression, and Recommendations:    Osteoarthritis of right knee Orthovisc 4 of 4 right knee. Unfortunately not noticing much improvement yet even after 3 injections. I think it is time to get IR consultation for genicular artery embolization. I will also refill her hydrocodone, she will keep ahead of constipation with laxatives. Return to see me in 6 weeks. She is aware that hydrocodone will be short-term only.    ____________________________________________ Ihor Austin. Benjamin Stain, M.D., ABFM., CAQSM., AME. Primary Care and Sports Medicine [facility]  Adjunct Professor of Family Medicine  [place] of [facility] of [employer]  Restaurant manager, fast food

## 2023-06-23 NOTE — Assessment & Plan Note (Addendum)
Orthovisc 4 of 4 right knee. Unfortunately not noticing much improvement yet even after 3 injections. I think it is time to get IR consultation for genicular artery embolization. I will also refill her hydrocodone, she will keep ahead of constipation with laxatives. Return to see me in 6 weeks. She is aware that hydrocodone will be short-term only.

## 2023-07-01 ENCOUNTER — Ambulatory Visit: Payer: Medicare HMO | Admitting: Cardiology

## 2023-07-14 ENCOUNTER — Other Ambulatory Visit: Payer: Self-pay | Admitting: Adult Health

## 2023-08-04 ENCOUNTER — Ambulatory Visit (INDEPENDENT_AMBULATORY_CARE_PROVIDER_SITE_OTHER): Payer: Medicare HMO | Admitting: Sports Medicine

## 2023-08-04 DIAGNOSIS — M1711 Unilateral primary osteoarthritis, right knee: Secondary | ICD-10-CM | POA: Diagnosis not present

## 2023-08-04 NOTE — Progress Notes (Signed)
    Procedures performed today:    None.  Independent interpretation of notes and tests performed by another provider:   None.  Brief History, Exam, Impression, and Recommendations:    Osteoarthritis of right knee This is a very pleasant 89 year old female with right-sided knee osteoarthritis, we have treated her extensively conservatively, she has had steroid injections, viscosupplementation all without improvement. She does have some hydrocodone for pain relief now, I ordered IR consult for genicular artery embolization at the last visit, she missed the initial phone call so we will place the consultation order again.    ____________________________________________ Ihor Austin. Benjamin Stain, M.D., ABFM., CAQSM., AME. Primary Care and Sports Medicine [facility]  Adjunct Professor of Family Medicine  [place] [employer] [facility] of Medicine  Restaurant manager, fast food

## 2023-08-04 NOTE — Assessment & Plan Note (Signed)
This is a very pleasant 89 year old female with right-sided knee osteoarthritis, we have treated her extensively conservatively, she has had steroid injections, viscosupplementation all without improvement. She does have some hydrocodone for pain relief now, I ordered IR consult for genicular artery embolization at the last visit, she missed the initial phone call so we will place the consultation order again.

## 2023-08-23 NOTE — Progress Notes (Signed)
Chief Complaint: Patient was seen in virtual consultation today for right knee pain  Referring Physician(s): Thekkekandam,Thomas J  History of Present Illness: Robin Malone is a 89 y.o. female with a medical history significant for atrial fibrillation (Eliquis), HTN, lumbar spinal stenosis and right knee osteoarthritis. She has been treated extensively with steroid injections, viscosupplementation and narcotic pain medication. Her last OrthoVisc injection was 06/23/23. Unfortunately none of these therapies have been beneficial, providing her with only a few days of relief after injections. She has been referred to our team to discuss geniculate artery embolization. Her knee pain has been present for nearly a decade, waxing and waning, but has significantly worsened over the past several months.  Takes gabapentin and tylenol for her back, which minimally improves her knee pain.      Past Medical History:  Diagnosis Date   Atrial fibrillation (HCC)    Chronic back pain    GERD (gastroesophageal reflux disease)    Hypertension    Hypothyroidism    Mixed hyperlipidemia     Past Surgical History:  Procedure Laterality Date   BACK SURGERY     Capsulotomy MPJ Right 10/01/2016   RT #2 Toe   Hammer Toe Repair Right 10/01/2016   RT #2 Toe    Allergies: Codeine, Levofloxacin, Norco [hydrocodone-acetaminophen], Ultram [tramadol], and Vibra-tab [doxycycline]  Medications: Prior to Admission medications   Medication Sig Start Date End Date Taking? Authorizing Provider  acetaminophen (TYLENOL) 650 MG CR tablet Take 650 mg by mouth every 6 (six) hours as needed for pain.    [provider]  apixaban (ELIQUIS) 5 MG TABS tablet Take 1 tablet (5 mg total) by mouth 2 (two) times daily. 02/22/23 04/21/89  Sharee Holster, NP  B Complex Vitamins (B COMPLEX 100 PO) Take 1 tablet by mouth daily at 6 (six) AM. 04/11/23   [provider]  Carboxymethylcellulose Sodium  (THERATEARS) 0.25 % SOLN Use twice daily    [provider]  Cyanocobalamin (VITAMIN B-12 PO) Take 1 tablet by mouth daily.    [provider]  dapagliflozin propanediol (FARXIGA) 10 MG TABS tablet Take 1 tablet (10 mg total) by mouth daily before breakfast. 04/22/23   Sharlene Dory, NP  diclofenac Sodium (VOLTAREN) 1 % GEL Apply 4 g topically 4 (four) times daily. To affected joint. 05/18/23   Monica Becton, MD  ezetimibe (ZETIA) 10 MG tablet Take 10 mg by mouth daily. 05/05/23   [provider]  fluticasone (FLONASE) 50 MCG/ACT nasal spray Place 1 spray into both nostrils at bedtime.    [provider]  furosemide (LASIX) 40 MG tablet Take 1 tablet (40 mg total) by mouth 2 (two) times daily. For 3 days, then reduce to 40 mg daily 06/14/23   Sharlene Dory, NP  gabapentin (NEURONTIN) 600 MG tablet Take 1 tablet (600 mg total) by mouth 3 (three) times daily. 04/15/23   Monica Becton, MD  HYDROcodone-acetaminophen (NORCO/VICODIN) 5-325 MG tablet Take 1 tablet by mouth every 8 (eight) hours as needed for moderate pain. 06/23/23   Monica Becton, MD  levothyroxine (SYNTHROID) 75 MCG tablet Take 1 tablet (75 mcg total) by mouth at bedtime. 02/22/23   Sharee Holster, NP  lisinopril (ZESTRIL) 20 MG tablet Take 20 mg by mouth daily. 03/31/23 03/30/24  [provider]  lovastatin (MEVACOR) 40 MG tablet Take 2 tablets (80 mg total) by mouth at bedtime. 02/22/23   Sharee Holster, NP  methocarbamol (ROBAXIN) 500 MG tablet  Take 1 tablet (500 mg total) by mouth every 8 (eight) hours as needed for muscle spasms. 02/22/23   Sharee Holster, NP  metoprolol tartrate (LOPRESSOR) 25 MG tablet Take 1 tablet (25 mg total) by mouth 2 (two) times daily. 02/22/23 04/21/89  Sharee Holster, NP  montelukast (SINGULAIR) 10 MG tablet Take 1 tablet (10 mg total) by mouth at bedtime as needed (allergies). 02/22/23   Sharee Holster, NP  omeprazole (PRILOSEC) 40 MG  capsule Take 1 capsule (40 mg total) by mouth at bedtime. 02/22/23   Sharee Holster, NP  polyethylene glycol (MIRALAX / GLYCOLAX) 17 g packet Take 17 g by mouth 2 (two) times daily. Patient taking differently: Take 17 g by mouth daily as needed. 02/16/23   Sherryll Burger, Pratik D, DO  spironolactone (ALDACTONE) 25 MG tablet Take 0.5 tablets (12.5 mg total) by mouth daily. 06/14/23   Sharlene Dory, NP     Family History  Problem Relation Age of Onset   Hypertension Father     Social History   Socioeconomic History   Marital status: Widowed    Spouse name: Not on file   Number of children: Not on file   Years of education: Not on file   Highest education level: 12th grade  Occupational History   Not on file  Tobacco Use   Smoking status: Former   Smokeless tobacco: Never  Vaping Use   Vaping status: Never Used  Substance and Sexual Activity   Alcohol use: Yes    Alcohol/week: 0.0 standard drinks of alcohol   Drug use: Never   Sexual activity: Not Currently  Other Topics Concern   Not on file  Social History Narrative   Not on file   Social Determinants of Health   Financial Resource Strain: Low Risk  (07/05/2020)   [facility] from [facility] visits prior to 02/12/2023., [facility] prior to 02/12/2023.   Overall Financial Resource Strain (CARDIA)    Difficulty of Paying Living Expenses: Not hard at all  Food Insecurity: No Food Insecurity (05/31/2023)   Hunger Vital Sign    Worried About Running Out of Food in the Last Year: Never true    Ran Out of Food in the Last Year: Never true  Transportation Needs: No Transportation Needs (05/31/2023)   PRAPARE - Administrator, Civil Service (Medical): No    Lack of Transportation (Non-Medical): No  Physical Activity: Unknown (05/31/2023)   Exercise Vital Sign    Days of Exercise per Week: 0 days    Minutes of Exercise per Session: Not on file  Stress: No Stress Concern Present  (05/31/2023)   [employer] of Occupational Health - Occupational Stress Questionnaire    Feeling of Stress : Not at all  Social Connections: Unknown (05/31/2023)   Social Connection and Isolation Panel [NHANES]    Frequency of Communication with Friends and Family: Three times a week    Frequency of Social Gatherings with Friends and Family: Twice a week    Attends Religious Services: Patient declined    Database administrator or Organizations: Not on file    Attends Banker Meetings: Not on file    Marital Status: Widowed     Review of Systems: A 12 point ROS discussed and pertinent positives are indicated in the HPI above.  All other systems are negative.  Vital Signs: There were no vitals taken for this visit.  Advance Care  Plan: The advanced care plan/surrogate decision maker was discussed at the time of visit and documented in the medical record.   No physical exam was performed in lieu of virtual telephone visit.   Imaging: Right Knee radiograph 02/06/22   Labs:  CBC: Recent Labs    02/13/23 0430 02/15/23 1811 02/24/23 0800 06/02/23 1317  WBC 6.8 9.6 7.8 9.5  HGB 10.1* 10.5* 10.3* 11.0*  HCT 32.4* 33.4* 32.9* 35.2*  PLT 189 190 210 219    COAGS: No results for input(s): "INR", "APTT" in the last 8760 hours.  BMP: Recent Labs    05/02/23 1454 06/02/23 1317 06/10/23 1158 06/22/23 1418  NA 136 139 137 138  K 3.8 3.6 3.4* 3.5  CL 97* 103 96* 97*  CO2 27 26 30 28   GLUCOSE 95 100* 103* 104*  BUN 23 25* 16 16  CALCIUM 9.3 8.4* 8.7* 9.1  CREATININE 1.08* 0.91 0.89 0.91  GFRNONAA 50* >60 >60 >60    LIVER FUNCTION TESTS: Recent Labs    02/12/23 0706  BILITOT 0.7  AST 21  ALT 13  ALKPHOS 57  PROT 6.7  ALBUMIN 3.6    TUMOR MARKERS: No results for input(s): "AFPTM", "CEA", "CA199", "CHROMGRNA" in the last 8760 hours.  Assessment and Plan:  89 year old female with right knee osteoarthritis unresponsive to conservative management.  She would be an excellent candidate for geniculate artery embolization.  -obtain updated right knee radiographs (AP + lateral) -plan for right knee GAE at [facility]   Marliss Coots, MD Pager: [phone]    I spent a total of 40 Minutes  in virtual clinical consultation, greater than 50% of which was counseling/coordinating care for right geniculate artery embolization.

## 2023-08-24 ENCOUNTER — Ambulatory Visit
Admission: RE | Admit: 2023-08-24 | Discharge: 2023-08-24 | Disposition: A | Discharge status: Home / Self Care | Payer: Medicare HMO | Source: Ambulatory Visit | Attending: Sports Medicine | Admitting: Sports Medicine

## 2023-08-24 ENCOUNTER — Ambulatory Visit (INDEPENDENT_AMBULATORY_CARE_PROVIDER_SITE_OTHER): Payer: Medicare HMO | Admitting: Family Medicine

## 2023-08-24 ENCOUNTER — Encounter: Payer: Self-pay | Admitting: Family Medicine

## 2023-08-24 VITALS — BP 96/62 | HR 85 | Ht 63.0 in | Wt 167.1 lb

## 2023-08-24 DIAGNOSIS — E782 Mixed hyperlipidemia: Secondary | ICD-10-CM

## 2023-08-24 DIAGNOSIS — I5030 Unspecified diastolic (congestive) heart failure: Secondary | ICD-10-CM | POA: Diagnosis not present

## 2023-08-24 DIAGNOSIS — I10 Essential (primary) hypertension: Secondary | ICD-10-CM | POA: Diagnosis not present

## 2023-08-24 DIAGNOSIS — E038 Other specified hypothyroidism: Secondary | ICD-10-CM

## 2023-08-24 DIAGNOSIS — R7301 Impaired fasting glucose: Secondary | ICD-10-CM

## 2023-08-24 DIAGNOSIS — E559 Vitamin D deficiency, unspecified: Secondary | ICD-10-CM

## 2023-08-24 DIAGNOSIS — M1711 Unilateral primary osteoarthritis, right knee: Secondary | ICD-10-CM

## 2023-08-24 HISTORY — PX: IR RADIOLOGIST EVAL & MGMT: IMG5224

## 2023-08-24 NOTE — Assessment & Plan Note (Addendum)
The patient has been receiving cardiology care in [place] but would like to transfer her care to the cardiology team in [place] for proximity. A referral has been placed. She reports no chest pain, palpitations, or shortness of breath.

## 2023-08-24 NOTE — Patient Instructions (Addendum)
  I appreciate the opportunity to provide care to you today!    Follow up:  4 months  Labs: please stop by the lab during the week to get your blood drawn (CBC, CMP, TSH, Lipid profile, HgA1c, Vit D)  Hypertensions Check your BP before taking your BP medication and do not take your blood pressure medicine if your BP is <90/60   Please schedule Medicare Annual Wellness   Please stop by your local pharmacy and get your Tdap and Shingles vaccine  Referrals today- Cardiology  Attached with your AVS, you will find valuable resources for self-education. I highly recommend dedicating some time to thoroughly examine them.   Please continue to a heart-healthy diet and increase your physical activities. Try to exercise for at least five days a week.    It was a pleasure to see you and I look forward to continuing to work together on your health and well-being. Please do not hesitate to call the office if you need care or have questions about your care.  In case of emergency, please visit the Emergency Department for urgent care, or contact our clinic at [phone] to schedule an appointment. We're here to help you!   Have a wonderful day and week. With Gratitude, Gilmore Laroche MSN, FNP-BC

## 2023-08-24 NOTE — Assessment & Plan Note (Addendum)
The patient is currently taking lovastatin 40 mg daily and ezetimibe 10 mg daily. She was encouraged to follow a heart-healthy diet and increase physical activity as tolerated. No refills are needed at this time. A lipid panel is pending.

## 2023-08-24 NOTE — Assessment & Plan Note (Addendum)
The patient's blood pressure is well-controlled on lisinopril 20 mg daily. She is also taking furosemide 40 mg daily for peripheral edema. The patient was advised to check her blood pressure before taking her medication and to hold her blood pressure medication if her reading is below 90/60 mmHg. She verbalized understanding of these instructions. Additionally, the patient was encouraged to follow a heart-healthy diet and increase physical activity BP Readings from Last 3 Encounters:  08/24/23 96/62  05/31/23 115/70  04/22/23 120/70

## 2023-08-24 NOTE — Progress Notes (Signed)
New Patient Office Visit  Subjective:  Patient ID: Robin Malone, female    DOB: [birth year]  Age: 89 y.o. MRN: [id number]  CC:  Chief Complaint  Patient presents with   New Patient (Initial Visit)    Establishing care today.     HPI Robin Malone is a 89 y.o. female with past medical history of heart failure with preserved ejection fraction, hypertension, GERD, hypothyroidism, hyperlipidemia and osteoarthritis of the right knee, presents for establishing care. For the details of today's visit, please refer to the assessment and plan.     Past Medical History:  Diagnosis Date   Atrial fibrillation (HCC)    Chronic back pain    GERD (gastroesophageal reflux disease)    Hypertension    Hypothyroidism    Mixed hyperlipidemia     Past Surgical History:  Procedure Laterality Date   BACK SURGERY     Capsulotomy MPJ Right 10/01/2016   RT #2 Toe   Hammer Toe Repair Right 10/01/2016   RT #2 Toe    Family History  Problem Relation Age of Onset   Hypertension Father     Social History   Socioeconomic History   Marital status: Widowed    Spouse name: Not on file   Number of children: Not on file   Years of education: Not on file   Highest education level: 12th grade  Occupational History   Not on file  Tobacco Use   Smoking status: Former   Smokeless tobacco: Never  Vaping Use   Vaping status: Never Used  Substance and Sexual Activity   Alcohol use: Yes    Alcohol/week: 0.0 standard drinks of alcohol   Drug use: Never   Sexual activity: Not Currently  Other Topics Concern   Not on file  Social History Narrative   Not on file   Social Determinants of Health   Financial Resource Strain: Low Risk  (07/05/2020)   [facility]12[facility]2023., [facility] visits prior to 02/12/2023.   Overall Financial Resource Strain (CARDIA)    Difficulty of Paying Living Expenses: Not hard at all  Food  Insecurity: No Food Insecurity (05/31/2023)   Hunger Vital Sign    Worried About Running Out of Food in the Last Year: Never true    Ran Out of Food in the Last Year: Never true  Transportation Needs: No Transportation Needs (05/31/2023)   PRAPARE - Administrator, Civil Service (Medical): No    Lack of Transportation (Non-Medical): No  Physical Activity: Unknown (05/31/2023)   Exercise Vital Sign    Days of Exercise per Week: 0 days    Minutes of Exercise per Session: Not on file  Stress: No Stress Concern Present (05/31/2023)   [employer] of Occupational Health - Occupational Stress Questionnaire    Feeling of Stress : Not at all  Social Connections: Unknown (05/31/2023)   Social Connection and Isolation Panel [NHANES]    Frequency of Communication with Friends and Family: Three times a week    Frequency of Social Gatherings with Friends and Family: Twice a week    Attends Religious Services: Patient declined    Database administrator or Organizations: Not on file    Attends Banker Meetings: Not on file    Marital Status: Widowed  Intimate Partner Violence: Not At Risk (02/12/2023)   Humiliation, Afraid, Rape, and Kick questionnaire    Fear of Current or  Ex-Partner: No    Emotionally Abused: No    Physically Abused: No    Sexually Abused: No    ROS Review of Systems  Constitutional:  Negative for fever.  Respiratory:  Negative for chest tightness and shortness of breath.   Cardiovascular:  Negative for chest pain and palpitations.  Gastrointestinal:  Negative for diarrhea, nausea and vomiting.  Neurological:  Negative for dizziness and headaches.    Objective:   Today's Vitals: BP 96/62   Pulse 85   Ht 5\' 3"  (1.6 m)   Wt 167 lb 1.3 oz (75.8 kg)   SpO2 92%   BMI 29.60 kg/m   Physical Exam HENT:     Head: Normocephalic.     Mouth/Throat:     Mouth: Mucous membranes are moist.  Cardiovascular:     Rate and Rhythm: Normal rate.     Heart  sounds: Normal heart sounds.  Pulmonary:     Effort: Pulmonary effort is normal.     Breath sounds: Normal breath sounds.  Neurological:     Mental Status: She is alert.      Assessment & Plan:   Primary hypertension Assessment & Plan: The patient's blood pressure is well-controlled on lisinopril 20 mg daily. She is also taking furosemide 40 mg daily for peripheral edema. The patient was advised to check her blood pressure before taking her medication and to hold her blood pressure medication if her reading is below 90/60 mmHg. She verbalized understanding of these instructions. Additionally, the patient was encouraged to follow a heart-healthy diet and increase physical activity BP Readings from Last 3 Encounters:  08/24/23 96/62  05/31/23 115/70  04/22/23 120/70      Diastolic heart failure, unspecified HF chronicity (HCC) Assessment & Plan: The patient has been receiving cardiology care in [place] but would like to transfer her care to the cardiology team in [place] for proximity. A referral has been placed. She reports no chest pain, palpitations, or shortness of breath.  Orders: -     Ambulatory referral to Cardiology  Mixed hyperlipidemia Assessment & Plan: The patient is currently taking lovastatin 40 mg daily and ezetimibe 10 mg daily. She was encouraged to follow a heart-healthy diet and increase physical activity as tolerated. No refills are needed at this time. A lipid panel is pending.  Orders: -     Lipid panel -     CMP14+EGFR -     CBC with Differential/Platelet  Other specified hypothyroidism Assessment & Plan: She takes Synthroid 75 mcg daily No complaints or concerns voiced Pending thyroid levels today Lab Results  Component Value Date   TSH 3.280 02/12/2023     Orders: -     TSH + free T4  IFG (impaired fasting glucose) -     Hemoglobin A1c  Vitamin D deficiency -     VITAMIN D 25 Hydroxy (Vit-D Deficiency, Fractures)     Follow-up: Return  in about 4 months (around 12/24/2023).   Gilmore Laroche, FNP

## 2023-08-24 NOTE — Assessment & Plan Note (Signed)
She takes Synthroid 75 mcg daily No complaints or concerns voiced Pending thyroid levels today Lab Results  Component Value Date   TSH 3.280 02/12/2023

## 2023-08-25 ENCOUNTER — Ambulatory Visit: Payer: Medicare HMO

## 2023-08-26 LAB — CBC WITH DIFFERENTIAL/PLATELET
Basophils Absolute: 0 10*3/uL (ref 0.0–0.2)
Basos: 0 %
EOS (ABSOLUTE): 0.1 10*3/uL (ref 0.0–0.4)
Eos: 1 %
Hematocrit: 40.2 % (ref 34.0–46.6)
Hemoglobin: 12.7 g/dL (ref 11.1–15.9)
Immature Grans (Abs): 0.1 10*3/uL (ref 0.0–0.1)
Immature Granulocytes: 1 %
Lymphocytes Absolute: 3.8 10*3/uL — ABNORMAL HIGH (ref 0.7–3.1)
Lymphs: 27 %
MCH: 34.2 pg — ABNORMAL HIGH (ref 26.6–33.0)
MCHC: 31.6 g/dL (ref 31.5–35.7)
MCV: 108 fL — ABNORMAL HIGH (ref 79–97)
Monocytes Absolute: 0.8 10*3/uL (ref 0.1–0.9)
Monocytes: 6 %
Neutrophils Absolute: 9 10*3/uL — ABNORMAL HIGH (ref 1.4–7.0)
Neutrophils: 65 %
Platelets: 292 10*3/uL (ref 150–450)
RBC: 3.71 x10E6/uL — ABNORMAL LOW (ref 3.77–5.28)
RDW: 13.1 % (ref 11.7–15.4)
WBC: 13.9 10*3/uL — ABNORMAL HIGH (ref 3.4–10.8)

## 2023-08-26 LAB — LIPID PANEL
Chol/HDL Ratio: 2.1 ratio (ref 0.0–4.4)
Cholesterol, Total: 132 mg/dL (ref 100–199)
HDL: 63 mg/dL (ref >39)
LDL Chol Calc (NIH): 45 mg/dL (ref 0–99)
Triglycerides: 140 mg/dL (ref 0–149)
VLDL Cholesterol Cal: 24 mg/dL (ref 5–40)

## 2023-08-26 LAB — CMP14+EGFR
ALT: 12 IU/L (ref 0–32)
AST: 23 IU/L (ref 0–40)
Albumin: 4.1 g/dL (ref 3.7–4.7)
Alkaline Phosphatase: 92 IU/L (ref 44–121)
BUN/Creatinine Ratio: 17 (ref 12–28)
BUN: 14 mg/dL (ref 8–27)
Bilirubin Total: 0.3 mg/dL (ref 0.0–1.2)
CO2: 24 mmol/L (ref 20–29)
Calcium: 9.4 mg/dL (ref 8.7–10.3)
Chloride: 101 mmol/L (ref 96–106)
Creatinine, Ser: 0.82 mg/dL (ref 0.57–1.00)
Globulin, Total: 2.6 g/dL (ref 1.5–4.5)
Glucose: 88 mg/dL (ref 70–99)
Potassium: 4.5 mmol/L (ref 3.5–5.2)
Sodium: 143 mmol/L (ref 134–144)
Total Protein: 6.7 g/dL (ref 6.0–8.5)
eGFR: 69 mL/min/{1.73_m2} (ref >59)

## 2023-08-26 LAB — HEMOGLOBIN A1C
Est. average glucose Bld gHb Est-mCnc: 128 mg/dL
Hgb A1c MFr Bld: 6.1 % — ABNORMAL HIGH (ref 4.8–5.6)

## 2023-08-26 LAB — TSH+FREE T4
Free T4: 1.43 ng/dL (ref 0.82–1.77)
TSH: 2.89 u[IU]/mL (ref 0.450–4.500)

## 2023-08-26 LAB — VITAMIN D 25 HYDROXY (VIT D DEFICIENCY, FRACTURES): Vit D, 25-Hydroxy: 13.1 ng/mL — ABNORMAL LOW (ref 30.0–100.0)

## 2023-08-29 ENCOUNTER — Other Ambulatory Visit: Payer: Self-pay | Admitting: Family Medicine

## 2023-08-29 DIAGNOSIS — E559 Vitamin D deficiency, unspecified: Secondary | ICD-10-CM

## 2023-08-29 MED ORDER — VITAMIN D (ERGOCALCIFEROL) 1.25 MG (50000 UNIT) PO CAPS
50000.0000 [IU] | ORAL_CAPSULE | ORAL | 1 refills | Status: AC
Start: 2023-08-29 — End: ?

## 2023-09-11 ENCOUNTER — Other Ambulatory Visit: Payer: Self-pay

## 2023-09-11 ENCOUNTER — Observation Stay (HOSPITAL_COMMUNITY)
Admission: EM | Admit: 2023-09-11 | Discharge: 2023-09-13 | Disposition: A | Discharge status: Home / Self Care | Payer: Medicare HMO | Attending: Family Medicine | Admitting: Family Medicine

## 2023-09-11 ENCOUNTER — Emergency Department (HOSPITAL_COMMUNITY): Payer: Medicare HMO

## 2023-09-11 ENCOUNTER — Encounter (HOSPITAL_COMMUNITY): Payer: Self-pay | Admitting: *Deleted

## 2023-09-11 DIAGNOSIS — I5030 Unspecified diastolic (congestive) heart failure: Secondary | ICD-10-CM | POA: Diagnosis present

## 2023-09-11 DIAGNOSIS — R29818 Other symptoms and signs involving the nervous system: Secondary | ICD-10-CM | POA: Diagnosis present

## 2023-09-11 DIAGNOSIS — R4189 Other symptoms and signs involving cognitive functions and awareness: Secondary | ICD-10-CM | POA: Diagnosis present

## 2023-09-11 DIAGNOSIS — R2689 Other abnormalities of gait and mobility: Secondary | ICD-10-CM | POA: Insufficient documentation

## 2023-09-11 DIAGNOSIS — R2681 Unsteadiness on feet: Secondary | ICD-10-CM | POA: Insufficient documentation

## 2023-09-11 DIAGNOSIS — J189 Pneumonia, unspecified organism: Principal | ICD-10-CM | POA: Diagnosis present

## 2023-09-11 DIAGNOSIS — Z79899 Other long term (current) drug therapy: Secondary | ICD-10-CM | POA: Diagnosis not present

## 2023-09-11 DIAGNOSIS — I5031 Acute diastolic (congestive) heart failure: Secondary | ICD-10-CM

## 2023-09-11 DIAGNOSIS — R1310 Dysphagia, unspecified: Secondary | ICD-10-CM | POA: Insufficient documentation

## 2023-09-11 DIAGNOSIS — K222 Esophageal obstruction: Secondary | ICD-10-CM

## 2023-09-11 DIAGNOSIS — K921 Melena: Secondary | ICD-10-CM | POA: Diagnosis present

## 2023-09-11 DIAGNOSIS — Z1152 Encounter for screening for COVID-19: Secondary | ICD-10-CM | POA: Diagnosis not present

## 2023-09-11 DIAGNOSIS — R197 Diarrhea, unspecified: Secondary | ICD-10-CM | POA: Diagnosis not present

## 2023-09-11 DIAGNOSIS — J329 Chronic sinusitis, unspecified: Secondary | ICD-10-CM | POA: Diagnosis not present

## 2023-09-11 DIAGNOSIS — Z7901 Long term (current) use of anticoagulants: Secondary | ICD-10-CM | POA: Diagnosis not present

## 2023-09-11 DIAGNOSIS — K5909 Other constipation: Secondary | ICD-10-CM | POA: Diagnosis present

## 2023-09-11 DIAGNOSIS — M6281 Muscle weakness (generalized): Secondary | ICD-10-CM | POA: Insufficient documentation

## 2023-09-11 DIAGNOSIS — I110 Hypertensive heart disease with heart failure: Secondary | ICD-10-CM | POA: Insufficient documentation

## 2023-09-11 DIAGNOSIS — J349 Unspecified disorder of nose and nasal sinuses: Secondary | ICD-10-CM | POA: Diagnosis present

## 2023-09-11 DIAGNOSIS — I10 Essential (primary) hypertension: Secondary | ICD-10-CM | POA: Diagnosis present

## 2023-09-11 DIAGNOSIS — I480 Paroxysmal atrial fibrillation: Secondary | ICD-10-CM | POA: Insufficient documentation

## 2023-09-11 DIAGNOSIS — I5032 Chronic diastolic (congestive) heart failure: Secondary | ICD-10-CM | POA: Diagnosis not present

## 2023-09-11 DIAGNOSIS — K219 Gastro-esophageal reflux disease without esophagitis: Secondary | ICD-10-CM | POA: Diagnosis present

## 2023-09-11 DIAGNOSIS — E039 Hypothyroidism, unspecified: Secondary | ICD-10-CM | POA: Diagnosis not present

## 2023-09-11 DIAGNOSIS — Z87891 Personal history of nicotine dependence: Secondary | ICD-10-CM | POA: Diagnosis not present

## 2023-09-11 DIAGNOSIS — M1711 Unilateral primary osteoarthritis, right knee: Secondary | ICD-10-CM

## 2023-09-11 DIAGNOSIS — D539 Nutritional anemia, unspecified: Secondary | ICD-10-CM | POA: Diagnosis present

## 2023-09-11 DIAGNOSIS — E782 Mixed hyperlipidemia: Secondary | ICD-10-CM | POA: Diagnosis present

## 2023-09-11 HISTORY — DX: Pneumonia, unspecified organism: J18.9

## 2023-09-11 HISTORY — DX: Diarrhea, unspecified: R19.7

## 2023-09-11 LAB — COMPREHENSIVE METABOLIC PANEL
ALT: 14 U/L (ref 0–44)
AST: 19 U/L (ref 15–41)
Albumin: 3.3 g/dL — ABNORMAL LOW (ref 3.5–5.0)
Alkaline Phosphatase: 52 U/L (ref 38–126)
Anion gap: 10 (ref 5–15)
BUN: 12 mg/dL (ref 8–23)
CO2: 24 mmol/L (ref 22–32)
Calcium: 8.8 mg/dL — ABNORMAL LOW (ref 8.9–10.3)
Chloride: 105 mmol/L (ref 98–111)
Creatinine, Ser: 0.63 mg/dL (ref 0.44–1.00)
GFR, Estimated: >60 mL/min (ref >60)
Glucose, Bld: 103 mg/dL — ABNORMAL HIGH (ref 70–99)
Potassium: 3.5 mmol/L (ref 3.5–5.1)
Sodium: 139 mmol/L (ref 135–145)
Total Bilirubin: 0.5 mg/dL (ref 0.3–1.2)
Total Protein: 6.4 g/dL — ABNORMAL LOW (ref 6.5–8.1)

## 2023-09-11 LAB — VITAMIN B12: Vitamin B-12: 607 pg/mL (ref 180–914)

## 2023-09-11 LAB — CBC WITH DIFFERENTIAL/PLATELET
Abs Immature Granulocytes: 0.04 10*3/uL (ref 0.00–0.07)
Basophils Absolute: 0 10*3/uL (ref 0.0–0.1)
Basophils Relative: 0 %
Eosinophils Absolute: 0.1 10*3/uL (ref 0.0–0.5)
Eosinophils Relative: 1 %
HCT: 37 % (ref 36.0–46.0)
Hemoglobin: 11.7 g/dL — ABNORMAL LOW (ref 12.0–15.0)
Immature Granulocytes: 1 %
Lymphocytes Relative: 31 %
Lymphs Abs: 1.9 10*3/uL (ref 0.7–4.0)
MCH: 34.9 pg — ABNORMAL HIGH (ref 26.0–34.0)
MCHC: 31.6 g/dL (ref 30.0–36.0)
MCV: 110.4 fL — ABNORMAL HIGH (ref 80.0–100.0)
Monocytes Absolute: 0.5 10*3/uL (ref 0.1–1.0)
Monocytes Relative: 8 %
Neutro Abs: 3.6 10*3/uL (ref 1.7–7.7)
Neutrophils Relative %: 59 %
Platelets: 204 10*3/uL (ref 150–400)
RBC: 3.35 MIL/uL — ABNORMAL LOW (ref 3.87–5.11)
RDW: 15.4 % (ref 11.5–15.5)
WBC: 6.1 10*3/uL (ref 4.0–10.5)
nRBC: 0 % (ref 0.0–0.2)

## 2023-09-11 LAB — URINALYSIS, W/ REFLEX TO CULTURE (INFECTION SUSPECTED)
Bacteria, UA: NONE SEEN
Bilirubin Urine: NEGATIVE
Glucose, UA: >=500 mg/dL — AB
Hgb urine dipstick: NEGATIVE
Ketones, ur: 20 mg/dL — AB
Leukocytes,Ua: NEGATIVE
Nitrite: NEGATIVE
Protein, ur: NEGATIVE mg/dL
Specific Gravity, Urine: 1.014 (ref 1.005–1.030)
pH: 7 (ref 5.0–8.0)

## 2023-09-11 LAB — LACTIC ACID, PLASMA
Lactic Acid, Venous: 1.3 mmol/L (ref 0.5–1.9)
Lactic Acid, Venous: 1.3 mmol/L (ref 0.5–1.9)

## 2023-09-11 LAB — RESP PANEL BY RT-PCR (RSV, FLU A&B, COVID)  RVPGX2
Influenza A by PCR: NEGATIVE
Influenza B by PCR: NEGATIVE
Resp Syncytial Virus by PCR: NEGATIVE
SARS Coronavirus 2 by RT PCR: NEGATIVE

## 2023-09-11 LAB — C DIFFICILE QUICK SCREEN W PCR REFLEX
C Diff antigen: POSITIVE — AB
C Diff toxin: NEGATIVE

## 2023-09-11 LAB — TROPONIN I (HIGH SENSITIVITY)
Troponin I (High Sensitivity): 6 ng/L (ref <18)
Troponin I (High Sensitivity): 8 ng/L (ref <18)

## 2023-09-11 LAB — MAGNESIUM: Magnesium: 2.1 mg/dL (ref 1.7–2.4)

## 2023-09-11 LAB — FOLATE: Folate: 25.4 ng/mL (ref >5.9)

## 2023-09-11 MED ORDER — ACETAMINOPHEN 325 MG PO TABS
650.0000 mg | ORAL_TABLET | Freq: Four times a day (QID) | ORAL | Status: DC | PRN
  Administered 2023-09-11 – 2023-09-13 (×7): 650 mg via ORAL
  Filled 2023-09-11 (×8): qty 2

## 2023-09-11 MED ORDER — DICLOFENAC SODIUM 1 % EX GEL: 4.0000 g | Freq: Four times a day (QID) | CUTANEOUS | Status: DC | PRN

## 2023-09-11 MED ORDER — LISINOPRIL 10 MG PO TABS
20.0000 mg | ORAL_TABLET | Freq: Every day | ORAL | Status: DC
  Administered 2023-09-11 – 2023-09-13 (×3): 20 mg via ORAL
  Filled 2023-09-11 (×3): qty 2

## 2023-09-11 MED ORDER — SODIUM CHLORIDE 0.9 % IV SOLN: INTRAVENOUS | Status: DC

## 2023-09-11 MED ORDER — ONDANSETRON HCL 4 MG/2ML IJ SOLN: 4.0000 mg | Freq: Four times a day (QID) | INTRAMUSCULAR | Status: DC | PRN

## 2023-09-11 MED ORDER — DIPHENHYDRAMINE HCL 50 MG/ML IJ SOLN
12.5000 mg | Freq: Once | INTRAMUSCULAR | Status: AC
  Administered 2023-09-11: 12.5 mg via INTRAVENOUS
  Filled 2023-09-11: qty 1

## 2023-09-11 MED ORDER — MONTELUKAST SODIUM 10 MG PO TABS
10.0000 mg | ORAL_TABLET | Freq: Every day | ORAL | Status: DC
  Administered 2023-09-11 – 2023-09-12 (×2): 10 mg via ORAL
  Filled 2023-09-11 (×2): qty 1

## 2023-09-11 MED ORDER — DIPHENOXYLATE-ATROPINE 2.5-0.025 MG PO TABS: 1.0000 | ORAL_TABLET | Freq: Four times a day (QID) | ORAL | Status: DC | PRN

## 2023-09-11 MED ORDER — SPIRONOLACTONE 12.5 MG HALF TABLET
12.5000 mg | ORAL_TABLET | Freq: Every day | ORAL | Status: DC
  Administered 2023-09-11 – 2023-09-13 (×3): 12.5 mg via ORAL
  Filled 2023-09-11 (×3): qty 1

## 2023-09-11 MED ORDER — SODIUM CHLORIDE 0.9 % IV SOLN
1.0000 g | Freq: Once | INTRAVENOUS | Status: AC
  Administered 2023-09-11: 1 g via INTRAVENOUS
  Filled 2023-09-11: qty 10

## 2023-09-11 MED ORDER — METOCLOPRAMIDE HCL 5 MG/ML IJ SOLN
10.0000 mg | Freq: Once | INTRAMUSCULAR | Status: AC
  Administered 2023-09-11: 10 mg via INTRAVENOUS
  Filled 2023-09-11: qty 2

## 2023-09-11 MED ORDER — FENTANYL CITRATE PF 50 MCG/ML IJ SOSY: 12.5000 ug | PREFILLED_SYRINGE | INTRAMUSCULAR | Status: DC | PRN

## 2023-09-11 MED ORDER — ONDANSETRON HCL 4 MG PO TABS: 4.0000 mg | ORAL_TABLET | Freq: Four times a day (QID) | ORAL | Status: DC | PRN

## 2023-09-11 MED ORDER — IOHEXOL 350 MG/ML SOLN
100.0000 mL | Freq: Once | INTRAVENOUS | Status: AC | PRN
  Administered 2023-09-11: 100 mL via INTRAVENOUS

## 2023-09-11 MED ORDER — PRAVASTATIN SODIUM 40 MG PO TABS
80.0000 mg | ORAL_TABLET | Freq: Every day | ORAL | Status: DC
  Administered 2023-09-11 – 2023-09-12 (×2): 80 mg via ORAL
  Filled 2023-09-11 (×3): qty 2

## 2023-09-11 MED ORDER — APIXABAN 5 MG PO TABS
5.0000 mg | ORAL_TABLET | Freq: Two times a day (BID) | ORAL | Status: DC
  Administered 2023-09-11 – 2023-09-13 (×4): 5 mg via ORAL
  Filled 2023-09-11 (×4): qty 1

## 2023-09-11 MED ORDER — ACETAMINOPHEN 650 MG RE SUPP: 650.0000 mg | Freq: Four times a day (QID) | RECTAL | Status: DC | PRN

## 2023-09-11 MED ORDER — GUAIFENESIN ER 600 MG PO TB12
600.0000 mg | ORAL_TABLET | Freq: Two times a day (BID) | ORAL | Status: DC
  Administered 2023-09-11 – 2023-09-13 (×4): 600 mg via ORAL
  Filled 2023-09-11 (×4): qty 1

## 2023-09-11 MED ORDER — LEVOTHYROXINE SODIUM 75 MCG PO TABS
75.0000 ug | ORAL_TABLET | Freq: Every day | ORAL | Status: DC
  Administered 2023-09-12 – 2023-09-13 (×2): 75 ug via ORAL
  Filled 2023-09-11 (×2): qty 1

## 2023-09-11 MED ORDER — DAPAGLIFLOZIN PROPANEDIOL 10 MG PO TABS
10.0000 mg | ORAL_TABLET | Freq: Every day | ORAL | Status: DC
  Administered 2023-09-12 – 2023-09-13 (×2): 10 mg via ORAL
  Filled 2023-09-11 (×2): qty 1

## 2023-09-11 MED ORDER — EZETIMIBE 10 MG PO TABS
10.0000 mg | ORAL_TABLET | Freq: Every day | ORAL | Status: DC
  Administered 2023-09-12 – 2023-09-13 (×2): 10 mg via ORAL
  Filled 2023-09-11 (×2): qty 1

## 2023-09-11 MED ORDER — LACTATED RINGERS IV BOLUS (SEPSIS): 500.0000 mL | Freq: Once | INTRAVENOUS | Status: DC

## 2023-09-11 MED ORDER — SODIUM CHLORIDE 0.9 % IV SOLN
500.0000 mg | Freq: Once | INTRAVENOUS | Status: AC
  Administered 2023-09-11: 500 mg via INTRAVENOUS
  Filled 2023-09-11: qty 5

## 2023-09-11 MED ORDER — FUROSEMIDE 40 MG PO TABS
40.0000 mg | ORAL_TABLET | Freq: Every day | ORAL | Status: DC
  Administered 2023-09-11: 40 mg via ORAL
  Filled 2023-09-11: qty 1

## 2023-09-11 MED ORDER — ONDANSETRON HCL 4 MG/2ML IJ SOLN
4.0000 mg | Freq: Once | INTRAMUSCULAR | Status: AC
  Administered 2023-09-11: 4 mg via INTRAVENOUS
  Filled 2023-09-11: qty 2

## 2023-09-11 MED ORDER — LACTATED RINGERS IV BOLUS (SEPSIS)
1000.0000 mL | Freq: Once | INTRAVENOUS | Status: AC
  Administered 2023-09-11: 1000 mL via INTRAVENOUS

## 2023-09-11 MED ORDER — DEXTROMETHORPHAN POLISTIREX ER 30 MG/5ML PO SUER
30.0000 mg | Freq: Two times a day (BID) | ORAL | Status: DC | PRN
  Administered 2023-09-11: 30 mg via ORAL
  Filled 2023-09-11: qty 5

## 2023-09-11 MED ORDER — FLUTICASONE PROPIONATE 50 MCG/ACT NA SUSP
1.0000 | Freq: Two times a day (BID) | NASAL | Status: DC
  Administered 2023-09-11 – 2023-09-13 (×4): 1 via NASAL
  Filled 2023-09-11: qty 16

## 2023-09-11 MED ORDER — OXYCODONE HCL 5 MG PO TABS
5.0000 mg | ORAL_TABLET | ORAL | Status: DC | PRN
  Administered 2023-09-11 – 2023-09-13 (×6): 5 mg via ORAL
  Filled 2023-09-11 (×6): qty 1

## 2023-09-11 MED ORDER — GABAPENTIN 300 MG PO CAPS
600.0000 mg | ORAL_CAPSULE | Freq: Three times a day (TID) | ORAL | Status: DC | PRN
  Administered 2023-09-11: 600 mg via ORAL
  Filled 2023-09-11: qty 2

## 2023-09-11 MED ORDER — SACCHAROMYCES BOULARDII 250 MG PO CAPS
250.0000 mg | ORAL_CAPSULE | Freq: Two times a day (BID) | ORAL | Status: DC
  Administered 2023-09-11 – 2023-09-13 (×4): 250 mg via ORAL
  Filled 2023-09-11 (×4): qty 1

## 2023-09-11 MED ORDER — PANTOPRAZOLE SODIUM 40 MG PO TBEC
40.0000 mg | DELAYED_RELEASE_TABLET | Freq: Two times a day (BID) | ORAL | Status: DC
  Administered 2023-09-11 – 2023-09-13 (×4): 40 mg via ORAL
  Filled 2023-09-11 (×4): qty 1

## 2023-09-11 MED ORDER — SODIUM CHLORIDE 0.9 % IV SOLN
1.5000 g | Freq: Four times a day (QID) | INTRAVENOUS | Status: DC
  Administered 2023-09-11 – 2023-09-12 (×3): 1.5 g via INTRAVENOUS
  Filled 2023-09-11 (×8): qty 4

## 2023-09-11 NOTE — ED Notes (Addendum)
Patient Azithromycin Infiltrated. IV removed. MD notified. Extremity elevated. Notified receiving nurse.

## 2023-09-11 NOTE — ED Notes (Signed)
Changed pt into a gown, changed underwear into hospital mesh underwear, pt stated "I'm just gonna throw my underwear that I got on away, they are dirty" pts gown, shaw, and pants placed in pt specific bag.

## 2023-09-11 NOTE — Plan of Care (Signed)

## 2023-09-11 NOTE — H&P (Signed)
History and Physical  [facility]  Robin Malone BJY:[id number] DOB: 1934-08-20 DOA: 09/11/2023  PCP: Gilmore Laroche, FNP  Patient coming from: Home  Level of care: Med-Surg  I have personally briefly reviewed patient's old medical records in [facility]  Chief Complaint: black stools  HPI: Robin Malone is a 89 year old female with history of HFpEF, paroxysmal A-fib, chronic venous insufficiency of legs, hypothyroidism, hyperlipidemia, hypertension, chronic back pain, GERD, osteoarthritis who recently relocated to this area from [facility]  to be closer to daughter.  She has established with local PCP and cardiology.  She had been in her usual state of health until she started having diarrhea with black and watery appearing stools last night.  She has had nausea but no emesis.  She has been dealing with cough, postnasal drainage and spitting up mucus recently.  She denies fever and chills.  In the ED she was evaluated and sent for a CT chest abd pelvis with findings of no PE, but there was suggestion of ground glass opacities suggesting multifocal infection or aspiration.  She reports occasional difficulty with swallowing mostly associated with postnasal drip.  The CT head showed paranasal sinus disease with reports of complete opacification of the right sphenoid sinus with contiguous opacity extending into the right sphenoethmoidal recess (widening the sinus ostium) with recommendation for nonurgent ENT referral.  Pt lives alone and given her advanced age and co-morbidities there was a request for inpatient observation for IV antibiotic treatment for pneumonia.     Past Medical History:  Diagnosis Date   Atrial fibrillation (HCC)    Chronic back pain    GERD (gastroesophageal reflux disease)    Hypertension    Hypothyroidism    Mixed hyperlipidemia     Past Surgical History:  Procedure Laterality Date   BACK SURGERY     Capsulotomy MPJ Right 10/01/2016   RT #2 Toe    Hammer Toe Repair Right 10/01/2016   RT #2 Toe   IR RADIOLOGIST EVAL & MGMT  08/24/2023     reports that she has quit smoking. She has never used smokeless tobacco. She reports current alcohol use. She reports that she does not use drugs.  Allergies  Allergen Reactions   Codeine Nausea And Vomiting   Levaquin [Levofloxacin] Other (See Comments)    Arthralgias    Norco [Hydrocodone-Acetaminophen] Nausea And Vomiting   Ultram [Tramadol] Nausea And Vomiting   Vibra-Tab [Doxycycline] Rash    Family History  Problem Relation Age of Onset   Hypertension Father     Prior to Admission medications   Medication Sig Start Date End Date Taking? Authorizing Provider  acetaminophen (TYLENOL) 650 MG CR tablet Take 650 mg by mouth 2 (two) times daily.   Yes [provider]  apixaban (ELIQUIS) 5 MG TABS tablet Take 1 tablet (5 mg total) by mouth 2 (two) times daily. Patient taking differently: Take 5 mg by mouth daily. 02/22/23 04/21/89 Yes Sharee Holster, NP  Cholecalciferol (VITAMIN D-3 PO) Take 1 capsule by mouth daily.   Yes [provider]  Cyanocobalamin (VITAMIN B-12 PO) Take 1 tablet by mouth daily.   Yes [provider]  dapagliflozin propanediol (FARXIGA) 10 MG TABS tablet Take 1 tablet (10 mg total) by mouth daily before breakfast. 04/22/23  Yes Sharlene Dory, NP  diclofenac Sodium (VOLTAREN) 1 % GEL Apply 4 g topically 4 (four) times daily. To affected joint. Patient taking differently: Apply 4 g topically 4 (four) times daily as needed (  knee pain). 05/18/23  Yes Monica Becton, MD  ezetimibe (ZETIA) 10 MG tablet Take 10 mg by mouth daily. 05/05/23  Yes [provider]  fluticasone (FLONASE) 50 MCG/ACT nasal spray Place 1 spray into both nostrils 2 (two) times daily.   Yes [provider]  furosemide (LASIX) 40 MG tablet Take 1 tablet (40 mg total) by mouth 2 (two) times daily. For 3 days, then reduce to 40 mg daily Patient taking  differently: Take 40 mg by mouth daily. 06/14/23  Yes Sharlene Dory, NP  gabapentin (NEURONTIN) 600 MG tablet Take 1 tablet (600 mg total) by mouth 3 (three) times daily. 04/15/23  Yes Monica Becton, MD  Glycerin-Hypromellose-PEG 400 (DRY EYE RELIEF DROPS OP) Place 1 drop into both eyes 2 (two) times daily as needed (dry eyes).   Yes [provider]  HYDROcodone-acetaminophen (NORCO/VICODIN) 5-325 MG tablet Take 1 tablet by mouth every 8 (eight) hours as needed for moderate pain. 06/23/23  Yes Monica Becton, MD  levothyroxine (SYNTHROID) 75 MCG tablet Take 1 tablet (75 mcg total) by mouth at bedtime. Patient taking differently: Take 75 mcg by mouth daily. 02/22/23  Yes Sharee Holster, NP  lisinopril (ZESTRIL) 20 MG tablet Take 20 mg by mouth daily. 03/31/23 03/30/24 Yes [provider]  lovastatin (MEVACOR) 40 MG tablet Take 2 tablets (80 mg total) by mouth at bedtime. 02/22/23  Yes Sharee Holster, NP  montelukast (SINGULAIR) 10 MG tablet Take 1 tablet (10 mg total) by mouth at bedtime as needed (allergies). 02/22/23  Yes Sharee Holster, NP  Multiple Vitamin (MULTIVITAMIN) tablet Take 1 tablet by mouth daily.   Yes [provider]  omeprazole (PRILOSEC) 40 MG capsule Take 1 capsule (40 mg total) by mouth at bedtime. 02/22/23  Yes Sharee Holster, NP  spironolactone (ALDACTONE) 25 MG tablet Take 12.5 mg by mouth daily.   Yes [provider]    Physical Exam: Vitals:   09/11/23 1336 09/11/23 1445 09/11/23 1457 09/11/23 1522  BP:  (!) 163/78  (!) 163/82  Pulse:  94  89  Resp:  18  17  Temp: 98.2 F (36.8 C) 98.3 F (36.8 C) 98.7 F (37.1 C) 98.5 F (36.9 C)  TempSrc: Oral Oral Oral Oral  SpO2:  94%  99%  Weight:      Height:        Constitutional: NAD, calm, comfortable, cooperative.  Eyes: PERRL, lids and conjunctivae normal ENMT: Mucous membranes are moist. Posterior pharynx clear of any exudate or lesions.  Neck: normal, supple, no  masses, no thyromegaly Respiratory: rales RLL. Normal respiratory effort. No accessory muscle use.  Cardiovascular: normal s1, s2 sounds, no murmurs / rubs / gallops. 1+ extremity edema. 2+ pedal pulses. No carotid bruits.  Abdomen: no tenderness, no masses palpated. No hepatosplenomegaly. Bowel sounds positive.  Musculoskeletal: chronic venous stasis BLEs.  no clubbing / cyanosis. No joint deformity upper and lower extremities. Good ROM, no contractures. Normal muscle tone.  Skin: no rashes, lesions, ulcers. No induration Neurologic: CN 2-12 grossly intact. Sensation intact, DTR normal. Strength 5/5 in all 4.  Psychiatric: Normal judgment and insight. Alert and oriented x 3. Normal mood.   Labs on Admission: I have personally reviewed following labs and imaging studies  CBC: Recent Labs  Lab 09/11/23 0945  WBC 6.1  NEUTROABS 3.6  HGB 11.7*  HCT 37.0  MCV 110.4*  PLT 204   Basic Metabolic Panel: Recent Labs  Lab 09/11/23 0945  NA 139  K 3.5  CL 105  CO2 24  GLUCOSE 103*  BUN 12  CREATININE 0.63  CALCIUM 8.8*  MG 2.1   GFR: Estimated Creatinine Clearance: 48.3 mL/min (by C-G formula based on SCr of 0.63 mg/dL). Liver Function Tests: Recent Labs  Lab 09/11/23 0945  AST 19  ALT 14  ALKPHOS 52  BILITOT 0.5  PROT 6.4*  ALBUMIN 3.3*   No results for input(s): "LIPASE", "AMYLASE" in the last 168 hours. No results for input(s): "AMMONIA" in the last 168 hours. Coagulation Profile: No results for input(s): "INR", "PROTIME" in the last 168 hours. Cardiac Enzymes: No results for input(s): "CKTOTAL", "CKMB", "CKMBINDEX", "TROPONINI" in the last 168 hours. BNP (last 3 results) No results for input(s): "PROBNP" in the last 8760 hours. HbA1C: No results for input(s): "HGBA1C" in the last 72 hours. CBG: No results for input(s): "GLUCAP" in the last 168 hours. Lipid Profile: No results for input(s): "CHOL", "HDL", "LDLCALC", "TRIG", "CHOLHDL", "LDLDIRECT" in the last 72  hours. Thyroid Function Tests: No results for input(s): "TSH", "T4TOTAL", "FREET4", "T3FREE", "THYROIDAB" in the last 72 hours. Anemia Panel: No results for input(s): "VITAMINB12", "FOLATE", "FERRITIN", "TIBC", "IRON", "RETICCTPCT" in the last 72 hours. Urine analysis:    Component Value Date/Time   COLORURINE YELLOW 09/11/2023 1058   APPEARANCEUR CLEAR 09/11/2023 1058   LABSPEC 1.014 09/11/2023 1058   PHURINE 7.0 09/11/2023 1058   GLUCOSEU >=500 (A) 09/11/2023 1058   HGBUR NEGATIVE 09/11/2023 1058   BILIRUBINUR NEGATIVE 09/11/2023 1058   BILIRUBINUR small (A) 02/04/2018 1239   KETONESUR 20 (A) 09/11/2023 1058   PROTEINUR NEGATIVE 09/11/2023 1058   UROBILINOGEN 0.2 02/04/2018 1239   NITRITE NEGATIVE 09/11/2023 1058   LEUKOCYTESUR NEGATIVE 09/11/2023 1058    Radiological Exams on Admission: CT ABDOMEN PELVIS W CONTRAST  Result Date: 09/11/2023 CLINICAL DATA:  Abdominal pain and diarrhea EXAM: CT ABDOMEN AND PELVIS WITH CONTRAST TECHNIQUE: Multidetector CT imaging of the abdomen and pelvis was performed using the standard protocol following bolus administration of intravenous contrast. RADIATION DOSE REDUCTION: This exam was performed according to the departmental dose-optimization program which includes automated exposure control, adjustment of the mA and/or kV according to patient size and/or use of iterative reconstruction technique. CONTRAST:  OMNIPAQUE IOHEXOL 350 MG/ML SOLN COMPARISON:  None Available. FINDINGS: Lower chest: Please see separately reported examination of the chest. Large hiatal hernia with nearly complete intrathoracic position of the stomach and portions of the pancreas Hepatobiliary: No solid liver abnormality is seen. No gallstones, gallbladder wall thickening, or biliary dilatation. Pancreas: Intrathoracic position of portions of the pancreatic body and tail. No pancreatic ductal dilatation or surrounding inflammatory changes. Spleen: Normal in size without  significant abnormality. Adrenals/Urinary Tract: Adrenal glands are unremarkable. Mildly kidneys with lobulated bilateral renal cortical scarring. Small benign renal cortical cysts, requiring no further follow-up or characterization. Bladder is unremarkable. Stomach/Bowel: Stomach is within normal limits. Appendix appears normal. No evidence of bowel wall thickening, distention, or inflammatory changes. Sigmoid diverticulosis. Vascular/Lymphatic: Aortic atherosclerosis. No enlarged abdominal or pelvic lymph nodes. Reproductive: Status post hysterectomy. Other: No abdominal wall hernia or abnormality. No ascites. Musculoskeletal: No acute or significant osseous findings. Degenerative anterolisthesis of L4 on L5 with chronic bilateral pars defects. Severe disc degenerative disease and osteophytosis throughout the lumbar spine. IMPRESSION: 1. No acute CT findings of the abdomen or pelvis to explain abdominal pain or diarrhea. 2. Large hiatal hernia with nearly complete intrathoracic position of the stomach and portions of the pancreas. 3. Sigmoid  diverticulosis without evidence of acute diverticulitis. Aortic Atherosclerosis (ICD10-I70.0). Electronically Signed   By: Jearld Lesch M.D.   On: 09/11/2023 13:41   CT Angio Chest PE W and/or Wo Contrast  Result Date: 09/11/2023 CLINICAL DATA:  PE suspected, abdominal pain, diarrhea EXAM: CT ANGIOGRAPHY CHEST WITH CONTRAST TECHNIQUE: Multidetector CT imaging of the chest was performed using the standard protocol during bolus administration of intravenous contrast. Multiplanar CT image reconstructions and MIPs were obtained to evaluate the vascular anatomy. RADIATION DOSE REDUCTION: This exam was performed according to the departmental dose-optimization program which includes automated exposure control, adjustment of the mA and/or kV according to patient size and/or use of iterative reconstruction technique. CONTRAST:  OMNIPAQUE IOHEXOL 350 MG/ML SOLN COMPARISON:   None Available. FINDINGS: Cardiovascular: Satisfactory opacification of the pulmonary arteries to the segmental level. No evidence of pulmonary embolism. Normal heart size. Left and right coronary artery calcifications. No pericardial effusion. Aortic atherosclerosis. Mediastinum/Nodes: No enlarged mediastinal, hilar, or axillary lymph nodes. Large hiatal hernia with complete intrathoracic position of the stomach as well as portions of the pancreas. Thyroid gland, trachea, and esophagus demonstrate no significant findings. Lungs/Pleura: Scattered heterogeneous and ground-glass airspace disease throughout the peripheral lungs bilaterally (series 8, image 88). No pleural effusion or pneumothorax. Upper Abdomen: Please see separately reported examination of the abdomen and pelvis. Musculoskeletal: No chest wall abnormality. No acute osseous findings. Review of the MIP images confirms the above findings. IMPRESSION: 1. Negative examination for pulmonary embolism. 2. Scattered heterogeneous and ground-glass airspace disease throughout the peripheral lungs bilaterally, consistent with multifocal infection or aspiration. Peripheral distribution is generally suggestive of COVID airspace disease. 3. Large hiatal hernia with complete intrathoracic position of the stomach as well as portions of the pancreas. 4. Coronary artery disease. Aortic Atherosclerosis (ICD10-I70.0). Electronically Signed   By: Jearld Lesch M.D.   On: 09/11/2023 13:37   CT Head Wo Contrast  Result Date: 09/11/2023 CLINICAL DATA:  Provided history: Syncope/presyncope, cerebrovascular cause suspected. Diarrhea. EXAM: CT HEAD WITHOUT CONTRAST TECHNIQUE: Contiguous axial images were obtained from the base of the skull through the vertex without intravenous contrast. RADIATION DOSE REDUCTION: This exam was performed according to the departmental dose-optimization program which includes automated exposure control, adjustment of the mA and/or kV according  to patient size and/or use of iterative reconstruction technique. COMPARISON:  Report from head CT 08/07/2019 (images unavailable). FINDINGS: Brain: Generalized cerebral atrophy. There is no acute intracranial hemorrhage. No demarcated cortical infarct. No extra-axial fluid collection. No evidence of an intracranial mass. No midline shift. Vascular: No hyperdense vessel.  Atherosclerotic calcifications. Skull: No calvarial fracture or aggressive osseous lesion. Sinuses/Orbits: No mass or acute finding within the imaged orbits. Trace mucosal thickening within the right maxillary sinus at the imaged levels. Minimal mucosal thickening within the left maxillary sinus at the imaged levels. Complete opacification of the right sphenoid sinus with contiguous opacity extending into the right sphenoethmoidal recess (widening the sinus ostium). Superimposed mineralization within the right sphenoid sinus. Minimal mucosal thickening within the left sphenoid sinus and within the bilateral ethmoid and frontal sinuses. IMPRESSION: 1.  No evidence of an acute intracranial abnormality. 2. Paranasal sinus disease as described. Of note, there is complete opacification of the right sphenoid sinus with contiguous opacity extending into the right sphenoethmoidal recess (widening the sinus ostium). Non-emergent ENT referral recommended. 3. Generalized cerebral atrophy. Electronically Signed   By: Jackey Loge D.O.   On: 09/11/2023 13:15   DG Chest Port 1 View  Result Date: 09/11/2023  CLINICAL DATA:  Diarrhea, nausea. EXAM: PORTABLE CHEST 1 VIEW COMPARISON:  None Available. FINDINGS: The patient is rotated to the right. The heart size and mediastinal contours are within normal limits. Both lungs are clear. There is suggestion of a large hiatal hernia. Degenerative changes are seen in the spine. IMPRESSION: No active cardiopulmonary disease. Electronically Signed   By: Romona Curls M.D.   On: 09/11/2023 10:00     Assessment/Plan Principal Problem:   CAP (community acquired pneumonia) Active Problems:   Hypothyroidism   Mixed hyperlipidemia   HTN (hypertension)   GERD without esophagitis   Chronic constipation   Diastolic heart failure (HCC)   Neurocognitive deficits   Diarrhea   Macrocytic anemia   Black stools   Purulent postnasal drainage   Paranasal sinus disease   Multifocal vs Aspiration pneumonia - given history, likely this is aspiration pneumonia - pt reported spitting up frequently from purulent postnasal drainage - continue ampicillin/sulbactam IV every 6 hours - add probiotics - continue ongoing supportive measures  Black stools / diarrhea - given that patient is taking apixaban we will hemoccult stools - C diff and GI path panel testing ordered on admission - enteric precautions - probiotics as ordered  GERD  - pantoprazole ordered for GI protection   Paranasal sinus disease  - The CT head showed paranasal sinus disease with reports of complete opacification of the right sphenoid sinus with contiguous opacity extending into the right sphenoethmoidal recess (widening the sinus ostium) with recommendation for nonurgent ENT referral.  Chronic HFpEF - pt is followed by cardiology - pt is taking furosemide 40 mg daily  - pt is taking dapagliflozin 10 mg daily  - pt is taking spironolactone 12.5 mg daily  - also resumed lisinopril 20 mg daily   Hyperlipidemia - pravastatin 80 mg daily   Hypothyroidism  - resume home daily levothyroxine   Paroxysmal Atrial Fibrillation  - CHA2DS2-VASc score 5  - pt reports she only takes apixaban 5 mg once daily - we counseled her and ordered for 5 mg BID - currently she is not on rate controlling medication, will follow as HR now is controlled    DVT prophylaxis: apixaban   Code Status: DNR   Family Communication: daughter at bedside   Disposition Plan: anticipate home with [facility]   Consults called:   Admission status: OBV   Level of care: Med-Surg Standley Dakins MD [facility] How to contact the [facility] Attending or Consulting provider 7A - 7P or covering provider during after hours 7P -7A, for this patient?  Check the care team in [facility] and look for a) attending/consulting [facility] provider listed and b) the [facility] team listed Log into [email or web address] and use [place]'s universal password to access. If you do not have the password, please contact the hospital operator. Locate the [facility] provider you are looking for under [facility] and page to a number that you can be directly reached. If you still have difficulty reaching the provider, please page the [facility] (Director on Call) for the Hospitalists listed on amion for assistance.   If 7PM-7AM, please contact night-coverage [email or web address] Password [facility]  09/11/2023, 4:29 PM

## 2023-09-11 NOTE — ED Provider Notes (Signed)
[place] EMERGENCY DEPARTMENT AT [facility] Provider Note   CSN: [id number] Arrival date & time: 09/11/23  4098     History  Chief Complaint  Patient presents with   Diarrhea    Robin Malone is a 89 y.o. female.  HPI Patient presents for fatigue, chills, nausea and diarrhea.  Onset was last night.  Medical history includes arthritis, atrial fibrillation, HLD, GERD, CHF.  She reports a chronic cough that is recently worsened and is productive of green sputum.  Last night and this morning, although she has had an ongoing nausea, she has not vomited.  She arrives via EMS who reports moderate hypertension with otherwise normal vital signs.  CBG was 111.  Currently, patient endorses chills and ongoing nausea.    Home Medications Prior to Admission medications   Medication Sig Start Date End Date Taking? Authorizing Provider  acetaminophen (TYLENOL) 650 MG CR tablet Take 650 mg by mouth every 6 (six) hours as needed for pain.    [provider]  apixaban (ELIQUIS) 5 MG TABS tablet Take 1 tablet (5 mg total) by mouth 2 (two) times daily. 02/22/23 04/21/89  Sharee Holster, NP  B Complex Vitamins (B COMPLEX 100 PO) Take 1 tablet by mouth daily at 6 (six) AM. 04/11/23   [provider]  Carboxymethylcellulose Sodium (THERATEARS) 0.25 % SOLN Use twice daily    [provider]  Cyanocobalamin (VITAMIN B-12 PO) Take 1 tablet by mouth daily.    [provider]  dapagliflozin propanediol (FARXIGA) 10 MG TABS tablet Take 1 tablet (10 mg total) by mouth daily before breakfast. 04/22/23   Sharlene Dory, NP  diclofenac Sodium (VOLTAREN) 1 % GEL Apply 4 g topically 4 (four) times daily. To affected joint. 05/18/23   Monica Becton, MD  ezetimibe (ZETIA) 10 MG tablet Take 10 mg by mouth daily. 05/05/23   [provider]  fluticasone (FLONASE) 50 MCG/ACT nasal spray Place 1 spray into both nostrils at bedtime.    [provider]   furosemide (LASIX) 40 MG tablet Take 1 tablet (40 mg total) by mouth 2 (two) times daily. For 3 days, then reduce to 40 mg daily 06/14/23   Sharlene Dory, NP  gabapentin (NEURONTIN) 600 MG tablet Take 1 tablet (600 mg total) by mouth 3 (three) times daily. 04/15/23   Monica Becton, MD  HYDROcodone-acetaminophen (NORCO/VICODIN) 5-325 MG tablet Take 1 tablet by mouth every 8 (eight) hours as needed for moderate pain. 06/23/23   Monica Becton, MD  levothyroxine (SYNTHROID) 75 MCG tablet Take 1 tablet (75 mcg total) by mouth at bedtime. 02/22/23   Sharee Holster, NP  lisinopril (ZESTRIL) 20 MG tablet Take 20 mg by mouth daily. 03/31/23 03/30/24  [provider]  lovastatin (MEVACOR) 40 MG tablet Take 2 tablets (80 mg total) by mouth at bedtime. 02/22/23   Sharee Holster, NP  methocarbamol (ROBAXIN) 500 MG tablet Take 1 tablet (500 mg total) by mouth every 8 (eight) hours as needed for muscle spasms. 02/22/23   Sharee Holster, NP  montelukast (SINGULAIR) 10 MG tablet Take 1 tablet (10 mg total) by mouth at bedtime as needed (allergies). 02/22/23   Sharee Holster, NP  omeprazole (PRILOSEC) 40 MG capsule Take 1 capsule (40 mg total) by mouth at bedtime. 02/22/23   Sharee Holster, NP  polyethylene glycol (MIRALAX / GLYCOLAX) 17 g packet Take 17 g by mouth 2 (two) times daily. Patient taking differently: Take 17  g by mouth daily as needed. 02/16/23   Sherryll Burger, Pratik D, DO  Vitamin D, Ergocalciferol, (DRISDOL) 1.25 MG (50000 UNIT) CAPS capsule Take 1 capsule (50,000 Units total) by mouth every 7 (seven) days. 08/29/23   Gilmore Laroche, FNP      Allergies    Codeine, Levofloxacin, Norco [hydrocodone-acetaminophen], Ultram [tramadol], and Vibra-tab [doxycycline]    Review of Systems   Review of Systems  Constitutional:  Positive for chills and fatigue.  Gastrointestinal:  Positive for diarrhea and nausea.  All other systems reviewed and are negative.   Physical Exam Updated Vital  Signs BP (!) 155/72   Pulse 87   Temp 98.2 F (36.8 C) (Oral)   Resp 16   Ht 5\' 3"  (1.6 m)   Wt 75.7 kg   SpO2 97%   BMI 29.56 kg/m  Physical Exam Vitals and nursing note reviewed.  Constitutional:      General: She is not in acute distress.    Appearance: She is well-developed and normal weight. She is not toxic-appearing or diaphoretic.  HENT:     Head: Normocephalic and atraumatic.     Right Ear: External ear normal.     Left Ear: External ear normal.     Nose: Nose normal.     Mouth/Throat:     Mouth: Mucous membranes are moist.  Eyes:     Extraocular Movements: Extraocular movements intact.     Conjunctiva/sclera: Conjunctivae normal.  Cardiovascular:     Rate and Rhythm: Normal rate and regular rhythm.  Pulmonary:     Effort: Pulmonary effort is normal. No respiratory distress.     Breath sounds: Examination of the right-lower field reveals rales. Rales present. No wheezing or rhonchi.  Abdominal:     General: There is no distension.     Palpations: Abdomen is soft.     Tenderness: There is no abdominal tenderness.  Musculoskeletal:        General: No swelling.     Cervical back: Normal range of motion and neck supple.     Right lower leg: No edema.     Left lower leg: No edema.  Skin:    General: Skin is warm and dry.     Coloration: Skin is not jaundiced or pale.  Neurological:     General: No focal deficit present.     Mental Status: She is alert and oriented to person, place, and time.  Psychiatric:        Mood and Affect: Mood normal.        Behavior: Behavior normal.     ED Results / Procedures / Treatments   Labs (all labs ordered are listed, but only abnormal results are displayed) Labs Reviewed  COMPREHENSIVE METABOLIC PANEL - Abnormal; Notable for the following components:      Result Value   Glucose, Bld 103 (*)    Calcium 8.8 (*)    Total Protein 6.4 (*)    Albumin 3.3 (*)    All other components within normal limits  CBC WITH  DIFFERENTIAL/PLATELET - Abnormal; Notable for the following components:   RBC 3.35 (*)    Hemoglobin 11.7 (*)    MCV 110.4 (*)    MCH 34.9 (*)    All other components within normal limits  URINALYSIS, W/ REFLEX TO CULTURE (INFECTION SUSPECTED) - Abnormal; Notable for the following components:   Glucose, UA >=500 (*)    Ketones, ur 20 (*)    All other components within normal limits  CULTURE, BLOOD (ROUTINE X 2)  CULTURE, BLOOD (ROUTINE X 2)  RESP PANEL BY RT-PCR (RSV, FLU A&B, COVID)  [id number]  LACTIC ACID, PLASMA  LACTIC ACID, PLASMA  MAGNESIUM  POC OCCULT BLOOD, ED  TROPONIN I (HIGH SENSITIVITY)  TROPONIN I (HIGH SENSITIVITY)    EKG EKG Interpretation Date/Time:  Sunday September 11 2023 09:12:22 EDT Ventricular Rate:  86 PR Interval:  144 QRS Duration:  123 QT Interval:  416 QTC Calculation: 498 R Axis:   77  Text Interpretation: Sinus rhythm Right bundle branch block Confirmed by Gloris Manchester ([id number]) on 09/11/2023 10:20:32 AM  Radiology CT ABDOMEN PELVIS W CONTRAST  Result Date: 09/11/2023 CLINICAL DATA:  Abdominal pain and diarrhea EXAM: CT ABDOMEN AND PELVIS WITH CONTRAST TECHNIQUE: Multidetector CT imaging of the abdomen and pelvis was performed using the standard protocol following bolus administration of intravenous contrast. RADIATION DOSE REDUCTION: This exam was performed according to the departmental dose-optimization program which includes automated exposure control, adjustment of the mA and/or kV according to patient size and/or use of iterative reconstruction technique. CONTRAST:  OMNIPAQUE IOHEXOL 350 MG/ML SOLN COMPARISON:  None Available. FINDINGS: Lower chest: Please see separately reported examination of the chest. Large hiatal hernia with nearly complete intrathoracic position of the stomach and portions of the pancreas Hepatobiliary: No solid liver abnormality is seen. No gallstones, gallbladder wall thickening, or biliary dilatation. Pancreas: Intrathoracic  position of portions of the pancreatic body and tail. No pancreatic ductal dilatation or surrounding inflammatory changes. Spleen: Normal in size without significant abnormality. Adrenals/Urinary Tract: Adrenal glands are unremarkable. Mildly kidneys with lobulated bilateral renal cortical scarring. Small benign renal cortical cysts, requiring no further follow-up or characterization. Bladder is unremarkable. Stomach/Bowel: Stomach is within normal limits. Appendix appears normal. No evidence of bowel wall thickening, distention, or inflammatory changes. Sigmoid diverticulosis. Vascular/Lymphatic: Aortic atherosclerosis. No enlarged abdominal or pelvic lymph nodes. Reproductive: Status post hysterectomy. Other: No abdominal wall hernia or abnormality. No ascites. Musculoskeletal: No acute or significant osseous findings. Degenerative anterolisthesis of L4 on L5 with chronic bilateral pars defects. Severe disc degenerative disease and osteophytosis throughout the lumbar spine. IMPRESSION: 1. No acute CT findings of the abdomen or pelvis to explain abdominal pain or diarrhea. 2. Large hiatal hernia with nearly complete intrathoracic position of the stomach and portions of the pancreas. 3. Sigmoid diverticulosis without evidence of acute diverticulitis. Aortic Atherosclerosis (ICD10-I70.0). Electronically Signed   By: Jearld Lesch M.D.   On: 09/11/2023 13:41   CT Angio Chest PE W and/or Wo Contrast  Result Date: 09/11/2023 CLINICAL DATA:  PE suspected, abdominal pain, diarrhea EXAM: CT ANGIOGRAPHY CHEST WITH CONTRAST TECHNIQUE: Multidetector CT imaging of the chest was performed using the standard protocol during bolus administration of intravenous contrast. Multiplanar CT image reconstructions and MIPs were obtained to evaluate the vascular anatomy. RADIATION DOSE REDUCTION: This exam was performed according to the departmental dose-optimization program which includes automated exposure control, adjustment of the  mA and/or kV according to patient size and/or use of iterative reconstruction technique. CONTRAST:  OMNIPAQUE IOHEXOL 350 MG/ML SOLN COMPARISON:  None Available. FINDINGS: Cardiovascular: Satisfactory opacification of the pulmonary arteries to the segmental level. No evidence of pulmonary embolism. Normal heart size. Left and right coronary artery calcifications. No pericardial effusion. Aortic atherosclerosis. Mediastinum/Nodes: No enlarged mediastinal, hilar, or axillary lymph nodes. Large hiatal hernia with complete intrathoracic position of the stomach as well as portions of the pancreas. Thyroid gland, trachea, and esophagus demonstrate no significant findings. Lungs/Pleura: Scattered heterogeneous and  ground-glass airspace disease throughout the peripheral lungs bilaterally (series 8, image 88). No pleural effusion or pneumothorax. Upper Abdomen: Please see separately reported examination of the abdomen and pelvis. Musculoskeletal: No chest wall abnormality. No acute osseous findings. Review of the MIP images confirms the above findings. IMPRESSION: 1. Negative examination for pulmonary embolism. 2. Scattered heterogeneous and ground-glass airspace disease throughout the peripheral lungs bilaterally, consistent with multifocal infection or aspiration. Peripheral distribution is generally suggestive of COVID airspace disease. 3. Large hiatal hernia with complete intrathoracic position of the stomach as well as portions of the pancreas. 4. Coronary artery disease. Aortic Atherosclerosis (ICD10-I70.0). Electronically Signed   By: Jearld Lesch M.D.   On: 09/11/2023 13:37   CT Head Wo Contrast  Result Date: 09/11/2023 CLINICAL DATA:  Provided history: Syncope/presyncope, cerebrovascular cause suspected. Diarrhea. EXAM: CT HEAD WITHOUT CONTRAST TECHNIQUE: Contiguous axial images were obtained from the base of the skull through the vertex without intravenous contrast. RADIATION DOSE REDUCTION: This exam  was performed according to the departmental dose-optimization program which includes automated exposure control, adjustment of the mA and/or kV according to patient size and/or use of iterative reconstruction technique. COMPARISON:  Report from head CT 08/07/2019 (images unavailable). FINDINGS: Brain: Generalized cerebral atrophy. There is no acute intracranial hemorrhage. No demarcated cortical infarct. No extra-axial fluid collection. No evidence of an intracranial mass. No midline shift. Vascular: No hyperdense vessel.  Atherosclerotic calcifications. Skull: No calvarial fracture or aggressive osseous lesion. Sinuses/Orbits: No mass or acute finding within the imaged orbits. Trace mucosal thickening within the right maxillary sinus at the imaged levels. Minimal mucosal thickening within the left maxillary sinus at the imaged levels. Complete opacification of the right sphenoid sinus with contiguous opacity extending into the right sphenoethmoidal recess (widening the sinus ostium). Superimposed mineralization within the right sphenoid sinus. Minimal mucosal thickening within the left sphenoid sinus and within the bilateral ethmoid and frontal sinuses. IMPRESSION: 1.  No evidence of an acute intracranial abnormality. 2. Paranasal sinus disease as described. Of note, there is complete opacification of the right sphenoid sinus with contiguous opacity extending into the right sphenoethmoidal recess (widening the sinus ostium). Non-emergent ENT referral recommended. 3. Generalized cerebral atrophy. Electronically Signed   By: Jackey Loge D.O.   On: 09/11/2023 13:15   DG Chest Port 1 View  Result Date: 09/11/2023 CLINICAL DATA:  Diarrhea, nausea. EXAM: PORTABLE CHEST 1 VIEW COMPARISON:  None Available. FINDINGS: The patient is rotated to the right. The heart size and mediastinal contours are within normal limits. Both lungs are clear. There is suggestion of a large hiatal hernia. Degenerative changes are seen in  the spine. IMPRESSION: No active cardiopulmonary disease. Electronically Signed   By: Romona Curls M.D.   On: 09/11/2023 10:00    Procedures Procedures    Medications Ordered in ED Medications  cefTRIAXone (ROCEPHIN) 1 g in sodium chloride 0.9 % 100 mL IVPB (has no administration in time range)  azithromycin (ZITHROMAX) 500 mg in sodium chloride 0.9 % 250 mL IVPB (has no administration in time range)  ondansetron (ZOFRAN) injection 4 mg (4 mg Intravenous Given 09/11/23 0935)  lactated ringers bolus 1,000 mL (0 mLs Intravenous Stopped 09/11/23 1129)  metoCLOPramide (REGLAN) injection 10 mg (10 mg Intravenous Given 09/11/23 1149)  diphenhydrAMINE (BENADRYL) injection 12.5 mg (12.5 mg Intravenous Given 09/11/23 1148)  iohexol (OMNIPAQUE) 350 MG/ML injection 100 mL (100 mLs Intravenous Contrast Given 09/11/23 1246)    ED Course/ Medical Decision Making/ A&P  Medical Decision Making Amount and/or Complexity of Data Reviewed Labs: ordered. Radiology: ordered. ECG/medicine tests: ordered.  Risk Prescription drug management.   This patient presents to the ED for concern of fatigue, chills, diarrhea, nausea, this involves an extensive number of treatment options, and is a complaint that carries with it a high risk of complications and morbidity.  The differential diagnosis includes pneumonia, intra-abdominal infection, viral illness, dehydration, metabolic derangements   Co morbidities that complicate the patient evaluation  arthritis, atrial fibrillation, HLD, GERD, CHF   Additional history obtained:  Additional history obtained from N/A External records from outside source obtained and reviewed including EMR   Lab Tests:  I Ordered, and personally interpreted labs.  The pertinent results include: Normal kidney function, normal electrolytes, baseline anemia, no leukocytosis, no evidence of UTI   Imaging Studies ordered:  I ordered imaging studies  including chest x-ray, CT head, CTA chest, CT of abdomen and pelvis I independently visualized and interpreted imaging which showed multifocal pneumonia without other acute findings I agree with the radiologist interpretation   Cardiac Monitoring: / EKG:  The patient was maintained on a cardiac monitor.  I personally viewed and interpreted the cardiac monitored which showed an underlying rhythm of: Sinus rhythm  Problem List / ED Course / Critical interventions / Medication management  Presenting for chills, nausea, diarrhea.  Vital signs on arrival notable for hypertension.  On exam, patient is mildly tremulous.  She endorses ongoing chills at the moment.  Abdomen is soft and nontender.  She endorses a recent cough and states that it has been productive of green sputum.  She does have some right lower crackles.  Workup was initiated for suspected infection.  Although x-ray did not show acute findings, CTA of chest did show evidence of multifocal pneumonia.  This would explain her symptoms.  Antibiotics were initiated.  Currently, patient lives alone.  Given the severity of her symptoms and rapid progression of them since last night, patient to be admitted. I ordered medication including the fluids for hydration; Zofran for nausea; Reglan, Benadryl for headache and nausea; azithromycin and ceftriaxone for pneumonia Reevaluation of the patient after these medicines showed that the patient improved I have reviewed the patients home medicines and have made adjustments as needed   Social Determinants of Health:  Lives independently         Final Clinical Impression(s) / ED Diagnoses Final diagnoses:  Multifocal pneumonia    Rx / DC Orders ED Discharge Orders     None         Gloris Manchester, MD 09/11/23 1406

## 2023-09-11 NOTE — Hospital Course (Addendum)
89 year old female with history of HFpEF, paroxysmal A-fib, chronic venous insufficiency of legs, hypothyroidism, hyperlipidemia, hypertension, chronic back pain, GERD, osteoarthritis who recently relocated to this area from [facility] [place] to be closer to daughter.  She has established with local PCP and cardiology.  She had been in her usual state of health until she started having diarrhea with black and watery appearing stools last night.  She has had nausea but no emesis.  She has been dealing with cough, postnasal drainage and spitting up mucus recently.  She denies fever and chills.  In the ED she was evaluated and sent for a CT chest abd pelvis with findings of no PE, but there was suggestion of ground glass opacities suggesting multifocal infection or aspiration.  She reports occasional difficulty with swallowing mostly associated with postnasal drip.  The CT head showed paranasal sinus disease with reports of complete opacification of the right sphenoid sinus with contiguous opacity extending into the right sphenoethmoidal recess (widening the sinus ostium) with recommendation for nonurgent ENT referral.  Pt lives alone and given her advanced age and co-morbidities there was a request for inpatient observation for IV antibiotic treatment for pneumonia.

## 2023-09-11 NOTE — ED Triage Notes (Signed)
Pt BIB [facility] for c/o diarrhea that started yesterday and has gone on all night; pt states the diarrhea is black and watery  Pt denies any pain except for her chronic knee pain  Pt has nausea but no vomiting

## 2023-09-11 NOTE — ED Notes (Signed)
Pt c/o dark colored urine

## 2023-09-12 DIAGNOSIS — R29818 Other symptoms and signs involving the nervous system: Secondary | ICD-10-CM | POA: Diagnosis not present

## 2023-09-12 DIAGNOSIS — K219 Gastro-esophageal reflux disease without esophagitis: Secondary | ICD-10-CM | POA: Diagnosis not present

## 2023-09-12 DIAGNOSIS — J189 Pneumonia, unspecified organism: Secondary | ICD-10-CM | POA: Diagnosis not present

## 2023-09-12 DIAGNOSIS — K921 Melena: Secondary | ICD-10-CM | POA: Diagnosis not present

## 2023-09-12 LAB — GASTROINTESTINAL PANEL BY PCR, STOOL (REPLACES STOOL CULTURE)

## 2023-09-12 LAB — BLOOD CULTURE ID PANEL (REFLEXED) - BCID2

## 2023-09-12 LAB — BASIC METABOLIC PANEL
Anion gap: 9 (ref 5–15)
BUN: 9 mg/dL (ref 8–23)
CO2: 26 mmol/L (ref 22–32)
Calcium: 8.4 mg/dL — ABNORMAL LOW (ref 8.9–10.3)
Chloride: 103 mmol/L (ref 98–111)
Creatinine, Ser: 0.72 mg/dL (ref 0.44–1.00)
GFR, Estimated: >60 mL/min (ref >60)
Glucose, Bld: 87 mg/dL (ref 70–99)
Potassium: 3.2 mmol/L — ABNORMAL LOW (ref 3.5–5.1)
Sodium: 138 mmol/L (ref 135–145)

## 2023-09-12 LAB — CBC
HCT: 35.6 % — ABNORMAL LOW (ref 36.0–46.0)
Hemoglobin: 11.3 g/dL — ABNORMAL LOW (ref 12.0–15.0)
MCH: 35.2 pg — ABNORMAL HIGH (ref 26.0–34.0)
MCHC: 31.7 g/dL (ref 30.0–36.0)
MCV: 110.9 fL — ABNORMAL HIGH (ref 80.0–100.0)
Platelets: 215 10*3/uL (ref 150–400)
RBC: 3.21 MIL/uL — ABNORMAL LOW (ref 3.87–5.11)
RDW: 15.5 % (ref 11.5–15.5)
WBC: 6.8 10*3/uL (ref 4.0–10.5)
nRBC: 0 % (ref 0.0–0.2)

## 2023-09-12 LAB — MAGNESIUM: Magnesium: 1.8 mg/dL (ref 1.7–2.4)

## 2023-09-12 LAB — CLOSTRIDIUM DIFFICILE BY PCR, REFLEXED: Toxigenic C. Difficile by PCR: NEGATIVE

## 2023-09-12 LAB — MRSA NEXT GEN BY PCR, NASAL: MRSA by PCR Next Gen: NOT DETECTED

## 2023-09-12 MED ORDER — POTASSIUM CHLORIDE CRYS ER 20 MEQ PO TBCR
40.0000 meq | EXTENDED_RELEASE_TABLET | Freq: Every day | ORAL | Status: DC
  Administered 2023-09-12: 40 meq via ORAL
  Filled 2023-09-12: qty 2

## 2023-09-12 MED ORDER — FUROSEMIDE 40 MG PO TABS
40.0000 mg | ORAL_TABLET | Freq: Every day | ORAL | Status: DC
  Administered 2023-09-13: 40 mg via ORAL
  Filled 2023-09-12: qty 1

## 2023-09-12 MED ORDER — MAGNESIUM SULFATE 2 GM/50ML IV SOLN
2.0000 g | Freq: Once | INTRAVENOUS | Status: AC
  Administered 2023-09-12: 2 g via INTRAVENOUS
  Filled 2023-09-12: qty 50

## 2023-09-12 MED ORDER — SODIUM CHLORIDE 0.9 % IV SOLN
3.0000 g | Freq: Four times a day (QID) | INTRAVENOUS | Status: DC
  Administered 2023-09-12 – 2023-09-13 (×3): 3 g via INTRAVENOUS
  Filled 2023-09-12 (×4): qty 8

## 2023-09-12 NOTE — Evaluation (Signed)
Physical Therapy Evaluation Patient Details Name: Robin Malone MRN: [id number] DOB: Jun 21, 1934 Today's Date: 09/12/2023  History of Present Illness  Robin Malone is a 89 year old female with history of HFpEF, paroxysmal A-fib, chronic venous insufficiency of legs, hypothyroidism, hyperlipidemia, hypertension, chronic back pain, GERD, osteoarthritis who recently relocated to this area from [facility] to be closer to daughter.  She has established with local PCP and cardiology.  She had been in her usual state of health until she started having diarrhea with black and watery appearing stools last night.  She has had nausea but no emesis.  She has been dealing with cough, postnasal drainage and spitting up mucus recently.  She denies fever and chills.  In the ED she was evaluated and sent for a CT chest abd pelvis with findings of no PE, but there was suggestion of ground glass opacities suggesting multifocal infection or aspiration.  She reports occasional difficulty with swallowing mostly associated with postnasal drip.  The CT head showed paranasal sinus disease with reports of complete opacification of the right sphenoid sinus with contiguous opacity extending into the right sphenoethmoidal recess (widening the sinus ostium) with recommendation for nonurgent ENT referral.  Pt lives alone and given her advanced age and co-morbidities there was a request for inpatient observation for IV antibiotic treatment for pneumonia.   Clinical Impression  Patient functioning near baseline for functional mobility and gait demonstrating good return for bed mobility, transfers and ambulating in room/hallway without loss of balance using cane.  Plan:  Patient discharged from physical therapy to care of nursing for ambulation daily as tolerated for length of stay.          If plan is discharge home, recommend the following: Other (comment) (near baseline)   Can travel by private vehicle        Equipment  Recommendations None recommended by PT  Recommendations for Other Services       Functional Status Assessment Patient has not had a recent decline in their functional status     Precautions / Restrictions Precautions Precautions: Fall Restrictions Weight Bearing Restrictions: No      Mobility  Bed Mobility Overal bed mobility: Modified Independent                  Transfers Overall transfer level: Modified independent                      Ambulation/Gait Ambulation/Gait assistance: Modified independent (Device/Increase time), Supervision Gait Distance (Feet): 80 Feet Assistive device: Straight cane Gait Pattern/deviations: Step-to pattern, Decreased step length - right, Decreased step length - left, Decreased stride length Gait velocity: decreased     General Gait Details: grossly WFL with good return for ambulating in room/hallway using SPC with mostly step-to pattern without loss of balance  Stairs            Wheelchair Mobility     Tilt Bed    Modified Rankin (Stroke Patients Only)       Balance Overall balance assessment: Needs assistance Sitting-balance support: Feet supported, No upper extremity supported Sitting balance-Leahy Scale: Good Sitting balance - Comments: seated at EOB   Standing balance support: During functional activity, No upper extremity supported Standing balance-Leahy Scale: Fair Standing balance comment: fair/good without AD, good using SPC                             Pertinent Vitals/Pain Pain Assessment Pain  Assessment: 0-10 Pain Score: 2  Pain Descriptors / Indicators: Headache Pain Intervention(s): Limited activity within patient's tolerance, Monitored during session    Home Living Family/patient expects to be discharged to:: Private residence Living Arrangements: Alone Available Help at Discharge: Family;Available 24 hours/day Type of Home: House Home Access: Stairs to enter Entrance  Stairs-Rails: None Entrance Stairs-Number of Steps: 1   Home Layout: One level Home Equipment: Cane - single point;Shower seat - built [employer] fundraiser (2 wheels);Rollator (4 wheels)      Prior Function Prior Level of Function : Independent/Modified Independent;Driving             Mobility Comments: household ambulator leaning on walls, furniture when not using cane, drives, hold onto shopping carts or uses cane in stores ADLs Comments: Independent     Extremity/Trunk Assessment   Upper Extremity Assessment Upper Extremity Assessment: Overall WFL for tasks assessed    Lower Extremity Assessment Lower Extremity Assessment: Overall WFL for tasks assessed    Cervical / Trunk Assessment Cervical / Trunk Assessment: Normal  Communication   Communication Communication: No apparent difficulties  Cognition Arousal: Alert Behavior During Therapy: WFL for tasks assessed/performed Overall Cognitive Status: Within Functional Limits for tasks assessed                                          General Comments      Exercises     Assessment/Plan    PT Assessment Patient does not need any further PT services  PT Problem List         PT Treatment Interventions      PT Goals (Current goals can be found in the Care Plan section)  Acute Rehab PT Goals Patient Stated Goal: return home with family to assist PT Goal Formulation: With patient Time For Goal Achievement: 09/12/23 Potential to Achieve Goals: Good    Frequency       Co-evaluation               AM-PAC PT "6 Clicks" Mobility  Outcome Measure Help needed turning from your back to your side while in a flat bed without using bedrails?: None Help needed moving from lying on your back to sitting on the side of a flat bed without using bedrails?: None Help needed moving to and from a bed to a chair (including a wheelchair)?: None Help needed standing up from a chair using your arms (e.g.,  wheelchair or bedside chair)?: None Help needed to walk in hospital room?: None Help needed climbing 3-5 steps with a railing? : A Little 6 Click Score: 23    End of Session   Activity Tolerance: Patient tolerated treatment well;Patient limited by fatigue Patient left: in chair;with call bell/phone within reach Nurse Communication: Mobility status PT Visit Diagnosis: Unsteadiness on feet (R26.81);Other abnormalities of gait and mobility (R26.89);Muscle weakness (generalized) (M62.81)    Time: 9147-8295 PT Time Calculation (min) (ACUTE ONLY): 28 min   Charges:   PT Evaluation $PT Eval Moderate Complexity: 1 Mod PT Treatments $Therapeutic Activity: 23-37 mins PT General Charges $$ ACUTE PT VISIT: 1 Visit         12:15 PM, 09/12/23 Ocie Bob, MPT Physical Therapist with [facility] [phone] office [phone] mobile phone

## 2023-09-12 NOTE — Progress Notes (Signed)
PHARMACY - PHYSICIAN COMMUNICATION CRITICAL VALUE ALERT - BLOOD CULTURE IDENTIFICATION (BCID)  Robin Malone is an 89 y.o. female who presented to [facility] on 09/11/2023 with a chief complaint of pneumonia  Assessment:  1 of 2 sets (anaerobic and aerobic bottle from same set ), Staphylococcus epidermidis, No resistance mechanisms detected  Name of physician (or Provider) Contacted: Clanford Johnson  Current antibiotics: Ampicillin-sulbactam 3 g IV q6H  Changes to prescribed antibiotics recommended:  Currently on Unasyn due to concerns for aspiration and/or severe sinus disease. No changes recommended.  Results for orders placed or performed during the hospital encounter of 09/11/23  Blood Culture ID Panel (Reflexed) (Collected: 09/11/2023  9:45 AM)  Result Value Ref Range   Enterococcus faecalis NOT DETECTED NOT DETECTED   Enterococcus Faecium NOT DETECTED NOT DETECTED   Listeria monocytogenes NOT DETECTED NOT DETECTED   Staphylococcus species DETECTED (A) NOT DETECTED   Staphylococcus aureus (BCID) NOT DETECTED NOT DETECTED   Staphylococcus epidermidis DETECTED (A) NOT DETECTED   Staphylococcus lugdunensis NOT DETECTED NOT DETECTED   Streptococcus species NOT DETECTED NOT DETECTED   Streptococcus agalactiae NOT DETECTED NOT DETECTED   Streptococcus pneumoniae NOT DETECTED NOT DETECTED   Streptococcus pyogenes NOT DETECTED NOT DETECTED   A.calcoaceticus-baumannii NOT DETECTED NOT DETECTED   Bacteroides fragilis NOT DETECTED NOT DETECTED   Enterobacterales NOT DETECTED NOT DETECTED   Enterobacter cloacae complex NOT DETECTED NOT DETECTED   Escherichia coli NOT DETECTED NOT DETECTED   Klebsiella aerogenes NOT DETECTED NOT DETECTED   Klebsiella oxytoca NOT DETECTED NOT DETECTED   Klebsiella pneumoniae NOT DETECTED NOT DETECTED   Proteus species NOT DETECTED NOT DETECTED   Salmonella species NOT DETECTED NOT DETECTED   Serratia marcescens NOT DETECTED NOT DETECTED   Haemophilus  influenzae NOT DETECTED NOT DETECTED   Neisseria meningitidis NOT DETECTED NOT DETECTED   Pseudomonas aeruginosa NOT DETECTED NOT DETECTED   Stenotrophomonas maltophilia NOT DETECTED NOT DETECTED   Candida albicans NOT DETECTED NOT DETECTED   Candida auris NOT DETECTED NOT DETECTED   Candida glabrata NOT DETECTED NOT DETECTED   Candida krusei NOT DETECTED NOT DETECTED   Candida parapsilosis NOT DETECTED NOT DETECTED   Candida tropicalis NOT DETECTED NOT DETECTED   Cryptococcus neoformans/gattii NOT DETECTED NOT DETECTED   Methicillin resistance mecA/C NOT DETECTED NOT DETECTED    Orson Aloe 09/12/2023  11:07 AM

## 2023-09-12 NOTE — Progress Notes (Signed)
PROGRESS NOTE   Robin Malone  ZOX:[id number] DOB: 12/27/1933 DOA: 09/11/2023 PCP: Gilmore Laroche, FNP   Chief Complaint  Patient presents with   Diarrhea   Level of care: Med-Surg  Brief Admission History:  89 year old female with history of HFpEF, paroxysmal A-fib, chronic venous insufficiency of legs, hypothyroidism, hyperlipidemia, hypertension, chronic back pain, GERD, osteoarthritis who recently relocated to this area from [facility] to be closer to daughter.  She has established with local PCP and cardiology.  She had been in her usual state of health until she started having diarrhea with black and watery appearing stools last night.  She has had nausea but no emesis.  She has been dealing with cough, postnasal drainage and spitting up mucus recently.  She denies fever and chills.  In the ED she was evaluated and sent for a CT chest abd pelvis with findings of no PE, but there was suggestion of ground glass opacities suggesting multifocal infection or aspiration.  She reports occasional difficulty with swallowing mostly associated with postnasal drip.  The CT head showed paranasal sinus disease with reports of complete opacification of the right sphenoid sinus with contiguous opacity extending into the right sphenoethmoidal recess (widening the sinus ostium) with recommendation for nonurgent ENT referral.  Pt lives alone and given her advanced age and co-morbidities there was a request for inpatient observation for IV antibiotic treatment for pneumonia.     Assessment and Plan:  Multifocal vs Aspiration pneumonia - given history, concerned for aspiration pneumonia - pt reported spitting up frequently from purulent postnasal drainage - continue ampicillin/sulbactam IV every 6 hours - added probiotics - continue ongoing supportive measures - SLP eval requested for swallow eval given concern for aspiration   Black stools / diarrhea - given that patient is taking apixaban we will  hemoccult stools - C diff and GI path panel testing ordered on admission - C diff antigen positive, awaiting PCR testing - enteric precautions - probiotics as ordered   Extensive Paranasal sinus disease  - The CT head showed paranasal sinus disease with reports of complete opacification of the right sphenoid sinus with contiguous opacity extending into the right sphenoethmoidal recess (widening the sinus ostium) with recommendation for nonurgent ENT referral, discussed with patient who is agreeable   Gram Positive Cocci Bacteremia  - noted in 2 bottles - possibly a contaminant but will follow cultures  - check MRSA Screening test  GERD  - pantoprazole ordered for GI protection    Paranasal sinus disease  - The CT head showed paranasal sinus disease with reports of complete opacification of the right sphenoid sinus with contiguous opacity extending into the right sphenoethmoidal recess (widening the sinus ostium) with recommendation for nonurgent ENT referral.   Chronic HFpEF - pt is followed by cardiology - pt is taking furosemide 40 mg daily  - pt is taking dapagliflozin 10 mg daily  - pt is taking spironolactone 12.5 mg daily  - also resumed lisinopril 20 mg daily    Hyperlipidemia - pravastatin 80 mg daily    Hypothyroidism  - resume home daily levothyroxine    Paroxysmal Atrial Fibrillation  - CHA2DS2-VASc score 5  - pt reports she only takes apixaban 5 mg once daily - we counseled her and ordered for 5 mg BID - currently she is not on rate controlling medication, will follow as HR now is controlled   DVT prophylaxis: apixaban Code Status: DNR Family Communication: daughter 9/29 Disposition: anticipate home with [facility]  Consultants:   Procedures:   Antimicrobials:  Unasyn 9/29>>   Subjective: Pt says she is having a lot of sinus pressure and pain.    Objective: Vitals:   09/11/23 1600 09/11/23 2010 09/12/23 0012 09/12/23 0352  BP: (!) 181/91 (!) 155/76  128/84 118/65  Pulse: 97 93 91 85  Resp: 20 18 18 18   Temp: 98.5 F (36.9 C) 97.8 F (36.6 C) 98.6 F (37 C) 98.3 F (36.8 C)  TempSrc: Oral Oral Oral Oral  SpO2: 97% 95% 95% 93%  Weight:      Height:        Intake/Output Summary (Last 24 hours) at 09/12/2023 1029 Last data filed at 09/12/2023 0900 Gross per 24 hour  Intake 396.1 ml  Output --  Net 396.1 ml   Filed Weights   09/11/23 0859  Weight: 75.7 kg   Examination:  General exam: Appears calm and comfortable  Respiratory system: diminished BS RLL Cardiovascular system: normal S1 & S2 heard. No JVD, murmurs, rubs, gallops or clicks. No pedal edema. Gastrointestinal system: Abdomen is nondistended, soft and nontender. No organomegaly or masses felt. Normal bowel sounds heard. Central nervous system: Alert and oriented. No focal neurological deficits. Extremities: Symmetric 5 x 5 power. Skin: No rashes, lesions or ulcers. Psychiatry: Judgement and insight appear normal. Mood & affect appropriate.   Data Reviewed: I have personally reviewed following labs and imaging studies  CBC: Recent Labs  Lab 09/11/23 0945 09/12/23 0506  WBC 6.1 6.8  NEUTROABS 3.6  --   HGB 11.7* 11.3*  HCT 37.0 35.6*  MCV 110.4* 110.9*  PLT 204 215    Basic Metabolic Panel: Recent Labs  Lab 09/11/23 0945 09/12/23 0506  NA 139 138  K 3.5 3.2*  CL 105 103  CO2 24 26  GLUCOSE 103* 87  BUN 12 9  CREATININE 0.63 0.72  CALCIUM 8.8* 8.4*  MG 2.1 1.8    CBG: No results for input(s): "GLUCAP" in the last 168 hours.  Recent Results (from the past 240 hour(s))  Resp panel by RT-PCR (RSV, Flu A&B, Covid) Anterior Nasal Swab     Status: None   Collection Time: 09/11/23  9:13 AM   Specimen: Anterior Nasal Swab  Result Value Ref Range Status   SARS Coronavirus 2 by RT PCR NEGATIVE NEGATIVE Final    Comment: (NOTE) SARS-CoV-2 target nucleic acids are NOT DETECTED.  The SARS-CoV-2 RNA is generally detectable in upper  respiratory specimens during the acute phase of infection. The lowest concentration of SARS-CoV-2 viral copies this assay can detect is 138 copies/mL. A negative result does not preclude SARS-Cov-2 infection and should not be used as the sole basis for treatment or other patient management decisions. A negative result may occur with  improper specimen collection/handling, submission of specimen other than nasopharyngeal swab, presence of viral mutation(s) within the areas targeted by this assay, and inadequate number of viral copies(<138 copies/mL). A negative result must be combined with clinical observations, patient history, and epidemiological information. The expected result is Negative.  Fact Sheet for Patients:  BloggerCourse.com  Fact Sheet for Healthcare Providers:  SeriousBroker.it  This test is no t yet approved or cleared by the [place] FDA and  has been authorized for detection and/or diagnosis of SARS-CoV-2 by FDA under an Emergency Use Authorization (EUA). This EUA will remain  in effect (meaning this test can be used) for the duration of the COVID-19 declaration under Section 564(b)(1) of the Act, 21 U.S.C.section 360bbb-3(b)(1), unless  the authorization is terminated  or revoked sooner.       Influenza A by PCR NEGATIVE NEGATIVE Final   Influenza B by PCR NEGATIVE NEGATIVE Final    Comment: (NOTE) The Xpert Xpress SARS-CoV-2/FLU/RSV plus assay is intended as an aid in the diagnosis of influenza from Nasopharyngeal swab specimens and should not be used as a sole basis for treatment. Nasal washings and aspirates are unacceptable for Xpert Xpress SARS-CoV-2/FLU/RSV testing.  Fact Sheet for Patients: BloggerCourse.com  Fact Sheet for Healthcare Providers: SeriousBroker.it  This test is not yet approved or cleared by the [place] FDA and has been  authorized for detection and/or diagnosis of SARS-CoV-2 by FDA under an Emergency Use Authorization (EUA). This EUA will remain in effect (meaning this test can be used) for the duration of the COVID-19 declaration under Section 564(b)(1) of the Act, 21 U.S.C. section 360bbb-3(b)(1), unless the authorization is terminated or revoked.     Resp Syncytial Virus by PCR NEGATIVE NEGATIVE Final    Comment: (NOTE) Fact Sheet for Patients: BloggerCourse.com  Fact Sheet for Healthcare Providers: SeriousBroker.it  This test is not yet approved or cleared by the [place] FDA and has been authorized for detection and/or diagnosis of SARS-CoV-2 by FDA under an Emergency Use Authorization (EUA). This EUA will remain in effect (meaning this test can be used) for the duration of the COVID-19 declaration under Section 564(b)(1) of the Act, 21 U.S.C. section 360bbb-3(b)(1), unless the authorization is terminated or revoked.  Performed at [facility], [address] [place], [place] [address]   Blood Culture (routine x 2)     Status: None (Preliminary result)   Collection Time: 09/11/23  9:45 AM   Specimen: Right Antecubital; Blood  Result Value Ref Range Status   Specimen Description   Final    RIGHT ANTECUBITAL BOTTLES DRAWN AEROBIC AND ANAEROBIC   Special Requests   Final    Blood Culture results may not be optimal due to an excessive volume of blood received in culture bottles   Culture   Final    NO GROWTH < 24 HOURS Performed at [facility], [address], [place], [place] [address]    Report Status PENDING  Incomplete  Blood Culture (routine x 2)     Status: None (Preliminary result)   Collection Time: 09/11/23  9:45 AM   Specimen: Left Antecubital; Blood  Result Value Ref Range Status   Specimen Description   Final    LEFT ANTECUBITAL BOTTLES DRAWN AEROBIC AND ANAEROBIC Performed at [facility], [address]  [place], [place] [address]    Special Requests   Final    Blood Culture results may not be optimal due to an excessive volume of blood received in culture bottles Performed at [facility], [address] [place], [place] [address]    Culture  Setup Time   Final    GRAM POSITIVE COCCI IN BOTH AEROBIC AND ANAEROBIC BOTTLES Gram Stain Report Called to,Read Back By and Verified With: Cathlyn Parsons AT 0701 ON [id number] BY S DALTON Organism ID to follow Performed at [facility], [address] [place], [place] [address]    Culture [facility] POSITIVE COCCI  Final   Report Status PENDING  Incomplete  C Difficile Quick Screen w PCR reflex     Status: Abnormal   Collection Time: 09/11/23  8:18 PM   Specimen: STOOL  Result Value Ref Range Status   C Diff antigen POSITIVE (A) NEGATIVE Final  Comment: CRITICAL RESULT CALLED TO, READ BACK BY AND VERIFIED WITH: M. Renold Don 2104 09/11/23 NN    C Diff toxin NEGATIVE NEGATIVE Final   C Diff interpretation Results are indeterminate. See PCR results.  Final    Comment: Performed at [facility], [address], [place], [place] [address]  C. Diff by PCR, Reflexed     Status: None   Collection Time: 09/11/23  8:18 PM  Result Value Ref Range Status   Toxigenic C. Difficile by PCR NEGATIVE NEGATIVE Final    Comment: Patient is colonized with non toxigenic C. difficile. May not need treatment unless significant symptoms are present. Performed at [facility], [address], [place], [place] [address]      Radiology Studies: CT ABDOMEN PELVIS W CONTRAST  Result Date: 09/11/2023 CLINICAL DATA:  Abdominal pain and diarrhea EXAM: CT ABDOMEN AND PELVIS WITH CONTRAST TECHNIQUE: Multidetector CT imaging of the abdomen and pelvis was performed using the standard protocol following bolus administration of intravenous contrast. RADIATION DOSE REDUCTION: This exam was performed according to the departmental dose-optimization program which includes automated exposure  control, adjustment of the mA and/or kV according to patient size and/or use of iterative reconstruction technique. CONTRAST:  OMNIPAQUE IOHEXOL 350 MG/ML SOLN COMPARISON:  None Available. FINDINGS: Lower chest: Please see separately reported examination of the chest. Large hiatal hernia with nearly complete intrathoracic position of the stomach and portions of the pancreas Hepatobiliary: No solid liver abnormality is seen. No gallstones, gallbladder wall thickening, or biliary dilatation. Pancreas: Intrathoracic position of portions of the pancreatic body and tail. No pancreatic ductal dilatation or surrounding inflammatory changes. Spleen: Normal in size without significant abnormality. Adrenals/Urinary Tract: Adrenal glands are unremarkable. Mildly kidneys with lobulated bilateral renal cortical scarring. Small benign renal cortical cysts, requiring no further follow-up or characterization. Bladder is unremarkable. Stomach/Bowel: Stomach is within normal limits. Appendix appears normal. No evidence of bowel wall thickening, distention, or inflammatory changes. Sigmoid diverticulosis. Vascular/Lymphatic: Aortic atherosclerosis. No enlarged abdominal or pelvic lymph nodes. Reproductive: Status post hysterectomy. Other: No abdominal wall hernia or abnormality. No ascites. Musculoskeletal: No acute or significant osseous findings. Degenerative anterolisthesis of L4 on L5 with chronic bilateral pars defects. Severe disc degenerative disease and osteophytosis throughout the lumbar spine. IMPRESSION: 1. No acute CT findings of the abdomen or pelvis to explain abdominal pain or diarrhea. 2. Large hiatal hernia with nearly complete intrathoracic position of the stomach and portions of the pancreas. 3. Sigmoid diverticulosis without evidence of acute diverticulitis. Aortic Atherosclerosis (ICD10-I70.0). Electronically Signed   By: Jearld Lesch M.D.   On: 09/11/2023 13:41   CT Angio Chest PE W and/or Wo  Contrast  Result Date: 09/11/2023 CLINICAL DATA:  PE suspected, abdominal pain, diarrhea EXAM: CT ANGIOGRAPHY CHEST WITH CONTRAST TECHNIQUE: Multidetector CT imaging of the chest was performed using the standard protocol during bolus administration of intravenous contrast. Multiplanar CT image reconstructions and MIPs were obtained to evaluate the vascular anatomy. RADIATION DOSE REDUCTION: This exam was performed according to the departmental dose-optimization program which includes automated exposure control, adjustment of the mA and/or kV according to patient size and/or use of iterative reconstruction technique. CONTRAST:  OMNIPAQUE IOHEXOL 350 MG/ML SOLN COMPARISON:  None Available. FINDINGS: Cardiovascular: Satisfactory opacification of the pulmonary arteries to the segmental level. No evidence of pulmonary embolism. Normal heart size. Left and right coronary artery calcifications. No pericardial effusion. Aortic atherosclerosis. Mediastinum/Nodes: No enlarged mediastinal, hilar, or axillary lymph nodes. Large hiatal hernia with complete  intrathoracic position of the stomach as well as portions of the pancreas. Thyroid gland, trachea, and esophagus demonstrate no significant findings. Lungs/Pleura: Scattered heterogeneous and ground-glass airspace disease throughout the peripheral lungs bilaterally (series 8, image 88). No pleural effusion or pneumothorax. Upper Abdomen: Please see separately reported examination of the abdomen and pelvis. Musculoskeletal: No chest wall abnormality. No acute osseous findings. Review of the MIP images confirms the above findings. IMPRESSION: 1. Negative examination for pulmonary embolism. 2. Scattered heterogeneous and ground-glass airspace disease throughout the peripheral lungs bilaterally, consistent with multifocal infection or aspiration. Peripheral distribution is generally suggestive of COVID airspace disease. 3. Large hiatal hernia with complete intrathoracic  position of the stomach as well as portions of the pancreas. 4. Coronary artery disease. Aortic Atherosclerosis (ICD10-I70.0). Electronically Signed   By: Jearld Lesch M.D.   On: 09/11/2023 13:37   CT Head Wo Contrast  Result Date: 09/11/2023 CLINICAL DATA:  Provided history: Syncope/presyncope, cerebrovascular cause suspected. Diarrhea. EXAM: CT HEAD WITHOUT CONTRAST TECHNIQUE: Contiguous axial images were obtained from the base of the skull through the vertex without intravenous contrast. RADIATION DOSE REDUCTION: This exam was performed according to the departmental dose-optimization program which includes automated exposure control, adjustment of the mA and/or kV according to patient size and/or use of iterative reconstruction technique. COMPARISON:  Report from head CT 08/07/2019 (images unavailable). FINDINGS: Brain: Generalized cerebral atrophy. There is no acute intracranial hemorrhage. No demarcated cortical infarct. No extra-axial fluid collection. No evidence of an intracranial mass. No midline shift. Vascular: No hyperdense vessel.  Atherosclerotic calcifications. Skull: No calvarial fracture or aggressive osseous lesion. Sinuses/Orbits: No mass or acute finding within the imaged orbits. Trace mucosal thickening within the right maxillary sinus at the imaged levels. Minimal mucosal thickening within the left maxillary sinus at the imaged levels. Complete opacification of the right sphenoid sinus with contiguous opacity extending into the right sphenoethmoidal recess (widening the sinus ostium). Superimposed mineralization within the right sphenoid sinus. Minimal mucosal thickening within the left sphenoid sinus and within the bilateral ethmoid and frontal sinuses. IMPRESSION: 1.  No evidence of an acute intracranial abnormality. 2. Paranasal sinus disease as described. Of note, there is complete opacification of the right sphenoid sinus with contiguous opacity extending into the right  sphenoethmoidal recess (widening the sinus ostium). Non-emergent ENT referral recommended. 3. Generalized cerebral atrophy. Electronically Signed   By: Jackey Loge D.O.   On: 09/11/2023 13:15   DG Chest Port 1 View  Result Date: 09/11/2023 CLINICAL DATA:  Diarrhea, nausea. EXAM: PORTABLE CHEST 1 VIEW COMPARISON:  None Available. FINDINGS: The patient is rotated to the right. The heart size and mediastinal contours are within normal limits. Both lungs are clear. There is suggestion of a large hiatal hernia. Degenerative changes are seen in the spine. IMPRESSION: No active cardiopulmonary disease. Electronically Signed   By: Romona Curls M.D.   On: 09/11/2023 10:00    Scheduled Meds:  apixaban  5 mg Oral BID   dapagliflozin propanediol  10 mg Oral QAC breakfast   ezetimibe  10 mg Oral Daily   fluticasone  1 spray Each Nare BID   [START ON 09/13/2023] furosemide  40 mg Oral Daily   guaiFENesin  600 mg Oral BID   levothyroxine  75 mcg Oral Q0600   lisinopril  20 mg Oral Daily   montelukast  10 mg Oral QHS   pantoprazole  40 mg Oral BID   potassium chloride  40 mEq Oral Daily   pravastatin  80  mg Oral q1800   saccharomyces boulardii  250 mg Oral BID   spironolactone  12.5 mg Oral Daily   Continuous Infusions:  sodium chloride 30 mL/hr at 09/11/23 1706   ampicillin-sulbactam (UNASYN) IV     magnesium sulfate bolus IVPB 2 g (09/12/23 1009)    LOS: 0 days   Time spent: 54 mins  Letha Mirabal Laural Benes, MD How to contact the [facility] Attending or Consulting provider 7A - 7P or covering provider during after hours 7P -7A, for this patient?  Check the care team in [facility] and look for a) attending/consulting TR[facility] provider listed and b) the [facility] team listed Log into [email or web address] and use [place]'s universal password to access. If you do not have the password, please contact the hospital operator. Locate the [facility] provider you are looking for under [facility] and page to a number that you can be  directly reached. If you still have difficulty reaching the provider, please page the [facility] (Director on Call) for the Hospitalists listed on amion for assistance.  09/12/2023, 10:29 AM

## 2023-09-12 NOTE — Plan of Care (Signed)

## 2023-09-12 NOTE — Progress Notes (Signed)
Mobility Specialist Progress Note:    09/12/23 1300  Mobility  Activity Refused mobility   Pt refused mobility d/t soreness from sitting in chair and fatigue. All needs met.   Lawerance Bach Mobility Specialist Please contact via Special educational needs teacher or  Rehab office at [phone]

## 2023-09-12 NOTE — Care Management Obs Status (Signed)
MEDICARE OBSERVATION STATUS NOTIFICATION   Patient Details  Name: Robin Malone MRN: [id number] Date of Birth: [birth year]   Medicare Observation Status Notification Given:  Yes    Corey Harold 09/12/2023, 1:48 PM

## 2023-09-12 NOTE — Evaluation (Signed)
Clinical/Bedside Swallow Evaluation Patient Details  Name: Robin Malone MRN: [id number] Date of Birth: [birth year]  Today's Date: 09/12/2023 Time: SLP Start Time (ACUTE ONLY): 1515 SLP Stop Time (ACUTE ONLY): 1554 SLP Time Calculation (min) (ACUTE ONLY): 39 min  Past Medical History:  Past Medical History:  Diagnosis Date   Atrial fibrillation (HCC)    Chronic back pain    GERD (gastroesophageal reflux disease)    Hypertension    Hypothyroidism    Mixed hyperlipidemia    Past Surgical History:  Past Surgical History:  Procedure Laterality Date   BACK SURGERY     Capsulotomy MPJ Right 10/01/2016   RT #2 Toe   Hammer Toe Repair Right 10/01/2016   RT #2 Toe   IR RADIOLOGIST EVAL & MGMT  08/24/2023   HPI:  89 year old female with history of HFpEF, paroxysmal A-fib, chronic venous insufficiency of legs, hypothyroidism, hyperlipidemia, hypertension, chronic back pain, GERD, osteoarthritis who recently relocated to this area from [facility] to be closer to daughter.  She has established with local PCP and cardiology.  She had been in her usual state of health until she started having diarrhea with black and watery appearing stools last night.  She has had nausea but no emesis.  She has been dealing with cough, postnasal drainage and spitting up mucus recently.  She denies fever and chills.  In the ED she was evaluated and sent for a CT chest abd pelvis with findings of no PE, but there was suggestion of ground glass opacities suggesting multifocal infection or aspiration.  She reports occasional difficulty with swallowing mostly associated with postnasal drip.  The CT head showed paranasal sinus disease with reports of complete opacification of the right sphenoid sinus with contiguous opacity extending into the right sphenoethmoidal recess (widening the sinus ostium) with recommendation for nonurgent ENT referral.  Pt lives alone and given her advanced age and co-morbidities there was a  request for inpatient observation for IV antibiotic treatment for pneumonia.  BSE requested.    Assessment / Plan / Recommendation  Clinical Impression  Clinical swallow evaluation completed at bedside. Pt reports that she was diagnosed with COVID at the end of August and has not felt well since. Oral motor examination is WNL. Pt with mild hoarse vocal quality which is eliminated with a throat clear. She reports long standing sinus problems and also several esophageal dilations with the last one noted on record in Care Everywhere 01/29/2021 with Dr. Octaviano Glow (non obstructing Schatzki's ring with dilation and Pt with reported improvement of symptoms). She tells me that she currently notes occasional globus sensation, regurgitation, and belching. Oropharyngeal swallow appears WNL and no signs of reduced airway protection. Pt currently battling post nasal drip, possible sinus infection, and PNA (? from dx of COVID at the end of August per Pt). Recommend f/u with GI and can be as an OP given her reports above and/or consider barium pill esophagram. SLP reviewed esohageal swallow precautions (chew solids well, go slowly, and sit upright after meals) and no further SLP services indicated at this time. SLP Visit Diagnosis: Dysphagia, unspecified (R13.10)    Aspiration Risk  No limitations    Diet Recommendation Regular;Thin liquid    Liquid Administration via: Cup;Straw Medication Administration: Whole meds with liquid Supervision: Patient able to self feed Compensations: Slow rate;Small sips/bites Postural Changes: Seated upright at 90 degrees;Remain upright for at least 30 minutes after po intake    Other  Recommendations Recommended Consults: Consider GI evaluation;Consider esophageal  assessment Oral Care Recommendations: Oral care BID    Recommendations for follow up therapy are one component of a multi-disciplinary discharge planning process, led by the attending physician.  Recommendations  may be updated based on patient status, additional functional criteria and insurance authorization.  Follow up Recommendations No SLP follow up      Assistance Recommended at Discharge    Functional Status Assessment Patient has not had a recent decline in their functional status  Frequency and Duration            Prognosis        Swallow Study   General Date of Onset: 09/11/23 HPI: 89 year old female with history of HFpEF, paroxysmal A-fib, chronic venous insufficiency of legs, hypothyroidism, hyperlipidemia, hypertension, chronic back pain, GERD, osteoarthritis who recently relocated to this area from [facility] to be closer to daughter.  She has established with local PCP and cardiology.  She had been in her usual state of health until she started having diarrhea with black and watery appearing stools last night.  She has had nausea but no emesis.  She has been dealing with cough, postnasal drainage and spitting up mucus recently.  She denies fever and chills.  In the ED she was evaluated and sent for a CT chest abd pelvis with findings of no PE, but there was suggestion of ground glass opacities suggesting multifocal infection or aspiration.  She reports occasional difficulty with swallowing mostly associated with postnasal drip.  The CT head showed paranasal sinus disease with reports of complete opacification of the right sphenoid sinus with contiguous opacity extending into the right sphenoethmoidal recess (widening the sinus ostium) with recommendation for nonurgent ENT referral.  Pt lives alone and given her advanced age and co-morbidities there was a request for inpatient observation for IV antibiotic treatment for pneumonia.  BSE requested. Type of Study: Bedside Swallow Evaluation Previous Swallow Assessment: N/A, but had EGD 01/29/2021 with Dr. Octaviano Glow Diet Prior to this Study: Regular;Thin liquids (Level 0) Temperature Spikes Noted: No Respiratory Status: Room  air History of Recent Intubation: No Behavior/Cognition: Alert;Cooperative;Pleasant mood Oral Cavity Assessment: Within Functional Limits Oral Care Completed by SLP: Yes Oral Cavity - Dentition: Adequate natural dentition Vision: Functional for self-feeding Self-Feeding Abilities: Able to feed self Patient Positioning: Upright in bed Baseline Vocal Quality: Normal;Hoarse (mild hoarseness clears with a cough) Volitional Cough: Strong Volitional Swallow: Able to elicit    Oral/Motor/Sensory Function Overall Oral Motor/Sensory Function: Within functional limits   Ice Chips Ice chips: Within functional limits Presentation: Spoon   Thin Liquid Thin Liquid: Within functional limits Presentation: Cup;Self Fed;Straw    Nectar Thick Nectar Thick Liquid: Not tested   Honey Thick Honey Thick Liquid: Not tested   Puree Puree: Within functional limits Presentation: Spoon;Self Fed   Solid     Solid: Within functional limits Presentation: Self Fed     Thank you,  Havery Moros, CCC-SLP [phone]  Haik Mahoney 09/12/2023,4:16 PM

## 2023-09-12 NOTE — Progress Notes (Signed)
   09/12/23 1325  TOC Brief Assessment  Insurance and Status Reviewed  Patient has primary care physician Yes  Home environment has been reviewed Home - alone  Prior level of function: independent - Children supports  Prior/Current Home Services No current home services  Social Determinants of Health Reivew SDOH reviewed no interventions necessary  Readmission risk has been reviewed Yes  Transition of care needs no transition of care needs at this time   Transition of Care Department [facility]) has reviewed patient and no TOC needs have been identified at this time. We will continue to monitor patient advancement through interdisciplinary progression rounds. If new patient transition needs arise, please place a TOC consult.

## 2023-09-13 DIAGNOSIS — I5032 Chronic diastolic (congestive) heart failure: Secondary | ICD-10-CM | POA: Diagnosis not present

## 2023-09-13 DIAGNOSIS — K921 Melena: Secondary | ICD-10-CM | POA: Diagnosis not present

## 2023-09-13 DIAGNOSIS — J189 Pneumonia, unspecified organism: Secondary | ICD-10-CM | POA: Diagnosis not present

## 2023-09-13 DIAGNOSIS — K219 Gastro-esophageal reflux disease without esophagitis: Secondary | ICD-10-CM | POA: Diagnosis not present

## 2023-09-13 LAB — CBC
HCT: 34.8 % — ABNORMAL LOW (ref 36.0–46.0)
Hemoglobin: 10.6 g/dL — ABNORMAL LOW (ref 12.0–15.0)
MCH: 34.1 pg — ABNORMAL HIGH (ref 26.0–34.0)
MCHC: 30.5 g/dL (ref 30.0–36.0)
MCV: 111.9 fL — ABNORMAL HIGH (ref 80.0–100.0)
Platelets: 192 10*3/uL (ref 150–400)
RBC: 3.11 MIL/uL — ABNORMAL LOW (ref 3.87–5.11)
RDW: 15.7 % — ABNORMAL HIGH (ref 11.5–15.5)
WBC: 6.6 10*3/uL (ref 4.0–10.5)
nRBC: 0 % (ref 0.0–0.2)

## 2023-09-13 LAB — BASIC METABOLIC PANEL
Anion gap: 9 (ref 5–15)
BUN: 9 mg/dL (ref 8–23)
CO2: 24 mmol/L (ref 22–32)
Calcium: 8.1 mg/dL — ABNORMAL LOW (ref 8.9–10.3)
Chloride: 104 mmol/L (ref 98–111)
Creatinine, Ser: 0.72 mg/dL (ref 0.44–1.00)
GFR, Estimated: >60 mL/min (ref >60)
Glucose, Bld: 92 mg/dL (ref 70–99)
Potassium: 3.4 mmol/L — ABNORMAL LOW (ref 3.5–5.1)
Sodium: 137 mmol/L (ref 135–145)

## 2023-09-13 MED ORDER — SACCHAROMYCES BOULARDII 250 MG PO CAPS: 250.0000 mg | ORAL_CAPSULE | Freq: Two times a day (BID) | ORAL | 0 refills | Status: AC

## 2023-09-13 MED ORDER — POTASSIUM CHLORIDE CRYS ER 20 MEQ PO TBCR
40.0000 meq | EXTENDED_RELEASE_TABLET | Freq: Once | ORAL | Status: AC
  Administered 2023-09-13: 40 meq via ORAL
  Filled 2023-09-13: qty 2

## 2023-09-13 MED ORDER — AMOXICILLIN-POT CLAVULANATE 875-125 MG PO TABS: 1.0000 | ORAL_TABLET | Freq: Two times a day (BID) | ORAL | 0 refills | Status: AC

## 2023-09-13 MED ORDER — DICLOFENAC SODIUM 1 % EX GEL
4.0000 g | Freq: Four times a day (QID) | CUTANEOUS | Status: AC | PRN
Start: 2023-09-13 — End: ?

## 2023-09-13 MED ORDER — FUROSEMIDE 40 MG PO TABS: 40.0000 mg | ORAL_TABLET | Freq: Every day | ORAL | Status: DC

## 2023-09-13 MED ORDER — DEXTROMETHORPHAN POLISTIREX ER 30 MG/5ML PO SUER: 30.0000 mg | Freq: Two times a day (BID) | ORAL | 0 refills | Status: DC | PRN

## 2023-09-13 MED ORDER — LEVOTHYROXINE SODIUM 75 MCG PO TABS: 75.0000 ug | ORAL_TABLET | Freq: Every day | ORAL | Status: DC

## 2023-09-13 NOTE — Discharge Instructions (Signed)
IMPORTANT INFORMATION: PAY CLOSE ATTENTION   PHYSICIAN DISCHARGE INSTRUCTIONS  Follow with Primary care provider  Alvira Monday, [place]  and other consultants as instructed by your Hospitalist Physician  [place] IF SYMPTOMS COME BACK, WORSEN OR NEW PROBLEM DEVELOPS   Please note: You were cared for by a hospitalist during your hospital stay. Every effort will be made to forward records to your primary care provider.  You can request that your primary care provider send for your hospital records if they have not received them.  Once you are discharged, your primary care physician will handle any further medical issues. Please note that NO REFILLS for any discharge medications will be authorized once you are discharged, as it is imperative that you return to your primary care physician (or establish a relationship with a primary care physician if you do not have one) for your post hospital discharge needs so that they can reassess your need for medications and monitor your lab values.  Please get a complete blood count and chemistry panel checked by your Primary MD at your next visit, and again as instructed by your Primary MD.  Get Medicines reviewed and adjusted: Please take all your medications with you for your next visit with your Primary MD  Laboratory/radiological data: Please request your Primary MD to go over all hospital tests and procedure/radiological results at the follow up, please ask your primary care provider to get all Hospital records sent to his/her office.  In some cases, they will be blood work, cultures and biopsy results pending at the time of your discharge. Please request that your primary care provider follow up on these results.  If you are diabetic, please bring your blood sugar readings with you to your follow up appointment with primary care.    Please call and make your follow up appointments as soon as possible.    Also Note  the following: If you experience worsening of your admission symptoms, develop shortness of breath, life threatening emergency, suicidal or homicidal thoughts you must seek medical attention immediately by calling 911 or calling your MD immediately  if symptoms less severe.  You must read complete instructions/literature along with all the possible adverse reactions/side effects for all the Medicines you take and that have been prescribed to you. Take any new Medicines after you have completely understood and accpet all the possible adverse reactions/side effects.   Do not drive when taking Pain medications or sleeping medications (Benzodiazepines)  Do not take more than prescribed Pain, Sleep and Anxiety Medications. It is not advisable to combine anxiety,sleep and pain medications without talking with your primary care practitioner  Special Instructions: If you have smoked or chewed Tobacco  in the last 2 yrs please stop smoking, stop any regular Alcohol  and or any Recreational drug use.  Wear Seat belts while driving.  Do not drive if taking any narcotic, mind altering or controlled substances or recreational drugs or alcohol.

## 2023-09-13 NOTE — Plan of Care (Signed)
  Problem: Activity: Goal: Risk for activity intolerance will decrease Outcome: Progressing   Problem: Coping: Goal: Level of anxiety will decrease Outcome: Progressing   Problem: Skin Integrity: Goal: Risk for impaired skin integrity will decrease Outcome: Progressing   

## 2023-09-13 NOTE — Discharge Summary (Signed)
Physician Discharge Summary  Robin Malone FAO:[id number] DOB: May 28, 1934 DOA: 09/11/2023  PCP: Gilmore Laroche, FNP  Admit date: 09/11/2023 Discharge date: 09/13/2023  Admitted From:  Home  Disposition: Home   Recommendations for Outpatient Follow-up:  Follow up with PCP in 1 weeks Establish care with [facility] GI regarding esophageal stricture Establish care with ENT [place] regarding sinus disease in 1-2 weeks  Please follow up on the following pending results: Final culture data   Discharge Condition: STABLE   CODE STATUS: DNR  DIET: soft foods, advance as tolerated    Brief Hospitalization Summary: Please see all hospital notes, images, labs for full details of the hospitalization. Admission Provider HPI:  89 year old female with history of HFpEF, paroxysmal A-fib, chronic venous insufficiency of legs, hypothyroidism, hyperlipidemia, hypertension, chronic back pain, GERD, osteoarthritis who recently relocated to this area from [name] to be closer to daughter.  She has established with local PCP and cardiology.  She had been in her usual state of health until she started having diarrhea with black and watery appearing stools last night.  She has had nausea but no emesis.  She has been dealing with cough, postnasal drainage and spitting up mucus recently.  She denies fever and chills.  In the ED she was evaluated and sent for a CT chest abd pelvis with findings of no PE, but there was suggestion of ground glass opacities suggesting multifocal infection or aspiration.  She reports occasional difficulty with swallowing mostly associated with postnasal drip.  The CT head showed paranasal sinus disease with reports of complete opacification of the right sphenoid sinus with contiguous opacity extending into the right sphenoethmoidal recess (widening the sinus ostium) with recommendation for nonurgent ENT referral.  Pt lives alone and given her advanced age and co-morbidities there was a  request for inpatient observation for IV antibiotic treatment for pneumonia.    HOSPITAL COURSE BY PROBLEM LIST   Multifocal vs Aspiration pneumonia - given history, concerned for aspiration pneumonia - pt reported spitting up frequently from purulent postnasal drainage - treated with ampicillin/sulbactam IV every 6 hours - added probiotics - continue ongoing supportive measures - SLP eval requested for swallow eval given concern for aspiration - Pt was seen by SLP and regular, thin diet ordered, and outpatient GI appt recommended - DC home on oral augmentin BID x 5 d   Black stools / diarrhea - given that Malone is taking apixaban we will hemoccult stools - C diff and GI path panel testing ordered on admission - C diff antigen positive, PCR testing NEGATIVE  - probiotics as ordered while on antibiotics    Extensive Paranasal sinus disease  - The CT head showed paranasal sinus disease with reports of complete opacification of the right sphenoid sinus with contiguous opacity extending into the right sphenoethmoidal recess (widening the sinus ostium) with recommendation for nonurgent ENT referral, discussed with Malone who is agreeable  - Ambulatory referral made to ENT for follow up    Gram Positive Cocci Bacteremia - CONTAMINANT  - discussed with pharm D we feel this is a contaminant    GERD  - resume home PPI for GI protection     Chronic HFpEF - pt is followed by cardiology - pt is taking furosemide 40 mg daily  - pt is taking dapagliflozin 10 mg daily  - pt is taking spironolactone 12.5 mg daily  - also resumed lisinopril 20 mg daily    Hyperlipidemia - pravastatin 80 mg daily    Hypothyroidism  -  resume home daily levothyroxine    Paroxysmal Atrial Fibrillation  - CHA2DS2-VASc score 5  - pt reports she only takes apixaban 5 mg once daily - we counseled her and ordered for 5 mg BID - currently she is not on rate controlling medication, will follow as HR now is  controlled - outpatient follow up with cardiology scheduled   Discharge Diagnoses:  Principal Problem:   CAP (community acquired pneumonia) Active Problems:   Hypothyroidism   Mixed hyperlipidemia   HTN (hypertension)   GERD without esophagitis   Chronic constipation   Diastolic heart failure (HCC)   Neurocognitive deficits   Diarrhea   Macrocytic anemia   Black stools   Purulent postnasal drainage   Paranasal sinus disease   Discharge Instructions: Discharge Instructions     Ambulatory referral to ENT   Complete by: As directed    Hospital follow up   Ambulatory referral to Gastroenterology   Complete by: As directed    What is the reason for referral?: Other      Allergies as of 09/13/2023       Reactions   Codeine Nausea And Vomiting   Levaquin [levofloxacin] Other (See Comments)   Arthralgias    Norco [hydrocodone-acetaminophen] Nausea And Vomiting   Ultram [tramadol] Nausea And Vomiting   Vibra-tab [doxycycline] Rash        Medication List     TAKE these medications    acetaminophen 650 MG CR tablet Commonly known as: TYLENOL Take 650 mg by mouth 2 (two) times daily.   amoxicillin-clavulanate 875-125 MG tablet Commonly known as: AUGMENTIN Take 1 tablet by mouth 2 (two) times daily for 5 days.   apixaban 5 MG Tabs tablet Commonly known as: ELIQUIS Take 1 tablet (5 mg total) by mouth 2 (two) times daily. What changed: when to take this   dapagliflozin propanediol 10 MG Tabs tablet Commonly known as: Farxiga Take 1 tablet (10 mg total) by mouth daily before breakfast.   dextromethorphan 30 MG/5ML liquid Commonly known as: DELSYM Take 5 mLs (30 mg total) by mouth 2 (two) times daily as needed for cough.   diclofenac Sodium 1 % Gel Commonly known as: VOLTAREN Apply 4 g topically 4 (four) times daily as needed (knee pain).   DRY EYE RELIEF DROPS OP Place 1 drop into both eyes 2 (two) times daily as needed (dry eyes).   ezetimibe 10 MG  tablet Commonly known as: ZETIA Take 10 mg by mouth daily.   fluticasone 50 MCG/ACT nasal spray Commonly known as: FLONASE Place 1 spray into both nostrils 2 (two) times daily.   furosemide 40 MG tablet Commonly known as: LASIX Take 1 tablet (40 mg total) by mouth daily.   gabapentin 600 MG tablet Commonly known as: NEURONTIN Take 1 tablet (600 mg total) by mouth 3 (three) times daily.   HYDROcodone-acetaminophen 5-325 MG tablet Commonly known as: NORCO/VICODIN Take 1 tablet by mouth every 8 (eight) hours as needed for moderate pain.   levothyroxine 75 MCG tablet Commonly known as: SYNTHROID Take 1 tablet (75 mcg total) by mouth daily.   lisinopril 20 MG tablet Commonly known as: ZESTRIL Take 20 mg by mouth daily.   lovastatin 40 MG tablet Commonly known as: MEVACOR Take 2 tablets (80 mg total) by mouth at bedtime.   montelukast 10 MG tablet Commonly known as: SINGULAIR Take 1 tablet (10 mg total) by mouth at bedtime as needed (allergies).   multivitamin tablet Take 1 tablet by mouth daily.  omeprazole 40 MG capsule Commonly known as: PRILOSEC Take 1 capsule (40 mg total) by mouth at bedtime.   saccharomyces boulardii 250 MG capsule Commonly known as: FLORASTOR Take 1 capsule (250 mg total) by mouth 2 (two) times daily for 14 days.   spironolactone 25 MG tablet Commonly known as: ALDACTONE Take 12.5 mg by mouth daily.   VITAMIN B-12 PO Take 1 tablet by mouth daily.   VITAMIN D-3 PO Take 1 capsule by mouth daily.        Follow-up Information     Gilmore Laroche, FNP. Schedule an appointment as soon as possible for a visit in 1 week(s).   Specialty: Family Medicine Why: Hospital Follow Up Contact information: [address] [place] [address] [phone]         Jimmie Molly, MD. Schedule an appointment as soon as possible for a visit in 2 week(s).   Specialty: Otolaryngology Why: Hospital Follow Up Contact information: [address] [place] [address] [phone]         [facility]. Schedule an appointment as soon as possible for a visit in 3 week(s).   Why: Hospital Follow Up Contact information: [address] [place] [address] [phone]               Allergies  Allergen Reactions   Codeine Nausea And Vomiting   Levaquin [Levofloxacin] Other (See Comments)    Arthralgias    Norco [Hydrocodone-Acetaminophen] Nausea And Vomiting   Ultram [Tramadol] Nausea And Vomiting   Vibra-Tab [Doxycycline] Rash   Allergies as of 09/13/2023       Reactions   Codeine Nausea And Vomiting   Levaquin [levofloxacin] Other (See Comments)   Arthralgias    Norco [hydrocodone-acetaminophen] Nausea And Vomiting   Ultram [tramadol] Nausea And Vomiting   Vibra-tab [doxycycline] Rash        Medication List     TAKE these medications    acetaminophen 650 MG CR tablet Commonly known as: TYLENOL Take 650 mg by mouth 2 (two) times daily.   amoxicillin-clavulanate 875-125 MG tablet Commonly known as: AUGMENTIN Take 1 tablet by mouth 2 (two) times daily for 5 days.   apixaban 5 MG Tabs tablet Commonly known as: ELIQUIS Take 1 tablet (5 mg total) by mouth 2 (two) times daily. What changed: when to take this   dapagliflozin propanediol 10 MG Tabs tablet Commonly known as: Farxiga Take 1 tablet (10 mg total) by mouth daily before breakfast.   dextromethorphan 30 MG/5ML liquid Commonly known as: DELSYM Take 5 mLs (30 mg total) by mouth 2 (two) times daily as needed for cough.   diclofenac Sodium 1 % Gel Commonly known as: VOLTAREN Apply 4 g topically 4 (four) times daily as needed (knee pain).   DRY EYE RELIEF DROPS OP Place 1 drop into both eyes 2 (two) times daily as needed (dry eyes).   ezetimibe 10 MG tablet Commonly known as: ZETIA Take 10 mg by mouth daily.   fluticasone 50 MCG/ACT nasal spray Commonly known as:  FLONASE Place 1 spray into both nostrils 2 (two) times daily.   furosemide 40 MG tablet Commonly known as: LASIX Take 1 tablet (40 mg total) by mouth daily.   gabapentin 600 MG tablet Commonly known as: NEURONTIN Take 1 tablet (600 mg total) by mouth 3 (three) times daily.   HYDROcodone-acetaminophen 5-325 MG tablet Commonly known as: NORCO/VICODIN Take 1 tablet by mouth every 8 (eight) hours  as needed for moderate pain.   levothyroxine 75 MCG tablet Commonly known as: SYNTHROID Take 1 tablet (75 mcg total) by mouth daily.   lisinopril 20 MG tablet Commonly known as: ZESTRIL Take 20 mg by mouth daily.   lovastatin 40 MG tablet Commonly known as: MEVACOR Take 2 tablets (80 mg total) by mouth at bedtime.   montelukast 10 MG tablet Commonly known as: SINGULAIR Take 1 tablet (10 mg total) by mouth at bedtime as needed (allergies).   multivitamin tablet Take 1 tablet by mouth daily.   omeprazole 40 MG capsule Commonly known as: PRILOSEC Take 1 capsule (40 mg total) by mouth at bedtime.   saccharomyces boulardii 250 MG capsule Commonly known as: FLORASTOR Take 1 capsule (250 mg total) by mouth 2 (two) times daily for 14 days.   spironolactone 25 MG tablet Commonly known as: ALDACTONE Take 12.5 mg by mouth daily.   VITAMIN B-12 PO Take 1 tablet by mouth daily.   VITAMIN D-3 PO Take 1 capsule by mouth daily.        Procedures/Studies: CT ABDOMEN PELVIS W CONTRAST  Result Date: 09/11/2023 CLINICAL DATA:  Abdominal pain and diarrhea EXAM: CT ABDOMEN AND PELVIS WITH CONTRAST TECHNIQUE: Multidetector CT imaging of the abdomen and pelvis was performed using the standard protocol following bolus administration of intravenous contrast. RADIATION DOSE REDUCTION: This exam was performed according to the departmental dose-optimization program which includes automated exposure control, adjustment of the mA and/or kV according to Malone size and/or use of iterative  reconstruction technique. CONTRAST:  OMNIPAQUE IOHEXOL 350 MG/ML SOLN COMPARISON:  None Available. FINDINGS: Lower chest: Please see separately reported examination of the chest. Large hiatal hernia with nearly complete intrathoracic position of the stomach and portions of the pancreas Hepatobiliary: No solid liver abnormality is seen. No gallstones, gallbladder wall thickening, or biliary dilatation. Pancreas: Intrathoracic position of portions of the pancreatic body and tail. No pancreatic ductal dilatation or surrounding inflammatory changes. Spleen: Normal in size without significant abnormality. Adrenals/Urinary Tract: Adrenal glands are unremarkable. Mildly kidneys with lobulated bilateral renal cortical scarring. Small benign renal cortical cysts, requiring no further follow-up or characterization. Bladder is unremarkable. Stomach/Bowel: Stomach is within normal limits. Appendix appears normal. No evidence of bowel wall thickening, distention, or inflammatory changes. Sigmoid diverticulosis. Vascular/Lymphatic: Aortic atherosclerosis. No enlarged abdominal or pelvic lymph nodes. Reproductive: Status post hysterectomy. Other: No abdominal wall hernia or abnormality. No ascites. Musculoskeletal: No acute or significant osseous findings. Degenerative anterolisthesis of L4 on L5 with chronic bilateral pars defects. Severe disc degenerative disease and osteophytosis throughout the lumbar spine. IMPRESSION: 1. No acute CT findings of the abdomen or pelvis to explain abdominal pain or diarrhea. 2. Large hiatal hernia with nearly complete intrathoracic position of the stomach and portions of the pancreas. 3. Sigmoid diverticulosis without evidence of acute diverticulitis. Aortic Atherosclerosis (ICD10-I70.0). Electronically Signed   By: Jearld Lesch M.D.   On: 09/11/2023 13:41   CT Angio Chest PE W and/or Wo Contrast  Result Date: 09/11/2023 CLINICAL DATA:  PE suspected, abdominal pain, diarrhea EXAM: CT  ANGIOGRAPHY CHEST WITH CONTRAST TECHNIQUE: Multidetector CT imaging of the chest was performed using the standard protocol during bolus administration of intravenous contrast. Multiplanar CT image reconstructions and MIPs were obtained to evaluate the vascular anatomy. RADIATION DOSE REDUCTION: This exam was performed according to the departmental dose-optimization program which includes automated exposure control, adjustment of the mA and/or kV according to Malone size and/or use of iterative reconstruction technique. CONTRAST:  OMNIPAQUE IOHEXOL 350 MG/ML SOLN COMPARISON:  None Available. FINDINGS: Cardiovascular: Satisfactory opacification of the pulmonary arteries to the segmental level. No evidence of pulmonary embolism. Normal heart size. Left and right coronary artery calcifications. No pericardial effusion. Aortic atherosclerosis. Mediastinum/Nodes: No enlarged mediastinal, hilar, or axillary lymph nodes. Large hiatal hernia with complete intrathoracic position of the stomach as well as portions of the pancreas. Thyroid gland, trachea, and esophagus demonstrate no significant findings. Lungs/Pleura: Scattered heterogeneous and ground-glass airspace disease throughout the peripheral lungs bilaterally (series 8, image 88). No pleural effusion or pneumothorax. Upper Abdomen: Please see separately reported examination of the abdomen and pelvis. Musculoskeletal: No chest wall abnormality. No acute osseous findings. Review of the MIP images confirms the above findings. IMPRESSION: 1. Negative examination for pulmonary embolism. 2. Scattered heterogeneous and ground-glass airspace disease throughout the peripheral lungs bilaterally, consistent with multifocal infection or aspiration. Peripheral distribution is generally suggestive of COVID airspace disease. 3. Large hiatal hernia with complete intrathoracic position of the stomach as well as portions of the pancreas. 4. Coronary artery disease. Aortic  Atherosclerosis (ICD10-I70.0). Electronically Signed   By: Jearld Lesch M.D.   On: 09/11/2023 13:37   CT Head Wo Contrast  Result Date: 09/11/2023 CLINICAL DATA:  Provided history: Syncope/presyncope, cerebrovascular cause suspected. Diarrhea. EXAM: CT HEAD WITHOUT CONTRAST TECHNIQUE: Contiguous axial images were obtained from the base of the skull through the vertex without intravenous contrast. RADIATION DOSE REDUCTION: This exam was performed according to the departmental dose-optimization program which includes automated exposure control, adjustment of the mA and/or kV according to Malone size and/or use of iterative reconstruction technique. COMPARISON:  Report from head CT 08/07/2019 (images unavailable). FINDINGS: Brain: Generalized cerebral atrophy. There is no acute intracranial hemorrhage. No demarcated cortical infarct. No extra-axial fluid collection. No evidence of an intracranial mass. No midline shift. Vascular: No hyperdense vessel.  Atherosclerotic calcifications. Skull: No calvarial fracture or aggressive osseous lesion. Sinuses/Orbits: No mass or acute finding within the imaged orbits. Trace mucosal thickening within the right maxillary sinus at the imaged levels. Minimal mucosal thickening within the left maxillary sinus at the imaged levels. Complete opacification of the right sphenoid sinus with contiguous opacity extending into the right sphenoethmoidal recess (widening the sinus ostium). Superimposed mineralization within the right sphenoid sinus. Minimal mucosal thickening within the left sphenoid sinus and within the bilateral ethmoid and frontal sinuses. IMPRESSION: 1.  No evidence of an acute intracranial abnormality. 2. Paranasal sinus disease as described. Of note, there is complete opacification of the right sphenoid sinus with contiguous opacity extending into the right sphenoethmoidal recess (widening the sinus ostium). Non-emergent ENT referral recommended. 3. Generalized  cerebral atrophy. Electronically Signed   By: Jackey Loge D.O.   On: 09/11/2023 13:15   DG Chest Port 1 View  Result Date: 09/11/2023 CLINICAL DATA:  Diarrhea, nausea. EXAM: PORTABLE CHEST 1 VIEW COMPARISON:  None Available. FINDINGS: The Malone is rotated to the right. The heart size and mediastinal contours are within normal limits. Both lungs are clear. There is suggestion of a large hiatal hernia. Degenerative changes are seen in the spine. IMPRESSION: No active cardiopulmonary disease. Electronically Signed   By: Romona Curls M.D.   On: 09/11/2023 10:00   IR Radiologist Eval & Mgmt  Result Date: 08/24/2023 EXAM: NEW Malone OFFICE VISIT CHIEF COMPLAINT: See Epic note. HISTORY OF PRESENT ILLNESS: See Epic note. REVIEW OF SYSTEMS: See Epic note. PHYSICAL EXAMINATION: See Epic note. ASSESSMENT AND PLAN: See Epic note. Marliss Coots, MD [facility] Electronically Signed   By: Marliss Coots M.D.   On: 08/24/2023 17:02     Subjective: No sinus headache today; no SOB and rare cough, on room air oxygen.   Discharge Exam: Vitals:   09/12/23 2155 09/13/23 0402  BP: (!) 122/59 125/66  Pulse: 80 72  Resp: 14 18  Temp: 98.9 F (37.2 C) 98.9 F (37.2 C)  SpO2: 96% 95%   Vitals:   09/12/23 0352 09/12/23 1328 09/12/23 2155 09/13/23 0402  BP: 118/65 (!) 99/48 (!) 122/59 125/66  Pulse: 85 80 80 72  Resp: 18 18 14 18   Temp: 98.3 F (36.8 C) 97.8 F (36.6 C) 98.9 F (37.2 C) 98.9 F (37.2 C)  TempSrc: Oral Oral  Oral  SpO2: 93% 95% 96% 95%  Weight:      Height:        General exam: Appears calm and comfortable  Respiratory system: BBS clear no increased work of breathing Cardiovascular system: normal S1 & S2 heard. No JVD, murmurs, rubs, gallops or clicks. No pedal edema. Gastrointestinal system: Abdomen is nondistended, soft and nontender. No organomegaly or masses felt. Normal bowel sounds heard. Central nervous system: Alert  and oriented. No focal neurological deficits. Extremities: Symmetric 5 x 5 power. Skin: No rashes, lesions or ulcers. Psychiatry: Judgement and insight appear normal. Mood & affect appropriate.   The results of significant diagnostics from this hospitalization (including imaging, microbiology, ancillary and laboratory) are listed below for reference.     Microbiology: Recent Results (from the past 240 hour(s))  Resp panel by RT-PCR (RSV, Flu A&B, Covid) Anterior Nasal Swab     Status: None   Collection Time: 09/11/23  9:13 AM   Specimen: Anterior Nasal Swab  Result Value Ref Range Status   SARS Coronavirus 2 by RT PCR NEGATIVE NEGATIVE Final    Comment: (NOTE) SARS-CoV-2 target nucleic acids are NOT DETECTED.  The SARS-CoV-2 RNA is generally detectable in upper respiratory specimens during the acute phase of infection. The lowest concentration of SARS-CoV-2 viral copies this assay can detect is 138 copies/mL. A negative result does not preclude SARS-Cov-2 infection and should not be used as the sole basis for treatment or other Malone management decisions. A negative result may occur with  improper specimen collection/handling, submission of specimen other than nasopharyngeal swab, presence of viral mutation(s) within the areas targeted by this assay, and inadequate number of viral copies(<138 copies/mL). A negative result must be combined with clinical observations, Malone history, and epidemiological information. The expected result is Negative.  Fact Sheet for Patients:  BloggerCourse.com  Fact Sheet for Healthcare Providers:  SeriousBroker.it  This test is no t yet approved or cleared by the [place] FDA and  has been authorized for detection and/or diagnosis of SARS-CoV-2 by FDA under an Emergency Use Authorization (EUA). This EUA will remain  in effect (meaning this test can be used) for the duration of  the COVID-19 declaration under Section 564(b)(1) of the Act, 21 U.S.C.section 360bbb-3(b)(1), unless the authorization is terminated  or revoked sooner.       Influenza A by PCR NEGATIVE NEGATIVE Final   Influenza B by PCR NEGATIVE NEGATIVE Final    Comment: (NOTE) The Xpert Xpress SARS-CoV-2/FLU/RSV plus assay is intended as an aid in the diagnosis of influenza from Nasopharyngeal swab specimens and should not be used as a sole basis for treatment. Nasal washings and aspirates are unacceptable for Xpert Xpress SARS-CoV-2/FLU/RSV testing.  Fact Sheet  for Patients: BloggerCourse.com  Fact Sheet for Healthcare Providers: SeriousBroker.it  This test is not yet approved or cleared by the [place] FDA and has been authorized for detection and/or diagnosis of SARS-CoV-2 by FDA under an Emergency Use Authorization (EUA). This EUA will remain in effect (meaning this test can be used) for the duration of the COVID-19 declaration under Section 564(b)(1) of the Act, 21 U.S.C. section 360bbb-3(b)(1), unless the authorization is terminated or revoked.     Resp Syncytial Virus by PCR NEGATIVE NEGATIVE Final    Comment: (NOTE) Fact Sheet for Patients: BloggerCourse.com  Fact Sheet for Healthcare Providers: SeriousBroker.it  This test is not yet approved or cleared by the [place] FDA and has been authorized for detection and/or diagnosis of SARS-CoV-2 by FDA under an Emergency Use Authorization (EUA). This EUA will remain in effect (meaning this test can be used) for the duration of the COVID-19 declaration under Section 564(b)(1) of the Act, 21 U.S.C. section 360bbb-3(b)(1), unless the authorization is terminated or revoked.  Performed at [facility], [address], [place], [place] [address]   Blood Culture (routine x 2)     Status: None (Preliminary result)   Collection  Time: 09/11/23  9:45 AM   Specimen: Right Antecubital; Blood  Result Value Ref Range Status   Specimen Description   Final    RIGHT ANTECUBITAL BOTTLES DRAWN AEROBIC AND ANAEROBIC   Special Requests   Final    Blood Culture results may not be optimal due to an excessive volume of blood received in culture bottles   Culture   Final    NO GROWTH 2 DAYS Performed at [facility], [address], [place], [place] [address]    Report Status PENDING  Incomplete  Blood Culture (routine x 2)     Status: Abnormal (Preliminary result)   Collection Time: 09/11/23  9:45 AM   Specimen: Left Antecubital; Blood  Result Value Ref Range Status   Specimen Description   Final    LEFT ANTECUBITAL BOTTLES DRAWN AEROBIC AND ANAEROBIC Performed at [facility], [address] [place], [place] [address]    Special Requests   Final    Blood Culture results may not be optimal due to an excessive volume of blood received in culture bottles Performed at [facility], [address], [place], [place] [address]    Culture  Setup Time   Final    GRAM POSITIVE COCCI IN BOTH AEROBIC AND ANAEROBIC BOTTLES Gram Stain Report Called to,Read Back By and Verified With: M RUNYON AT 0701 ON [id number] BY S DALTON    Culture (A)  Final    STAPHYLOCOCCUS CAPITIS STAPHYLOCOCCUS EPIDERMIDIS THE SIGNIFICANCE OF ISOLATING THIS ORGANISM FROM A SINGLE SET OF BLOOD CULTURES WHEN MULTIPLE SETS ARE DRAWN IS UNCERTAIN. PLEASE NOTIFY THE MICROBIOLOGY DEPARTMENT WITHIN ONE WEEK IF SPECIATION AND SENSITIVITIES ARE REQUIRED. Performed at [facility], [address], [place], [place] [address]    Report Status PENDING  Incomplete  Blood Culture ID Panel (Reflexed)     Status: Abnormal   Collection Time: 09/11/23  9:45 AM  Result Value Ref Range Status   Enterococcus faecalis NOT DETECTED NOT DETECTED Final   Enterococcus Faecium NOT DETECTED NOT DETECTED Final   Listeria monocytogenes NOT DETECTED NOT DETECTED Final    Staphylococcus species DETECTED (A) NOT DETECTED Final    Comment: CRITICAL RESULT CALLED TO, READ BACK BY AND VERIFIED WITH: W. ANDERSON PHARMD, AT 1056 09/12/23 D. Leighton Roach  Staphylococcus aureus (BCID) NOT DETECTED NOT DETECTED Final   Staphylococcus epidermidis DETECTED (A) NOT DETECTED Final    Comment: CRITICAL RESULT CALLED TO, READ BACK BY AND VERIFIED WITH: W. ANDERSON PHARMD, AT 1056 09/12/23 D. VANHOOK    Staphylococcus lugdunensis NOT DETECTED NOT DETECTED Final   Streptococcus species NOT DETECTED NOT DETECTED Final   Streptococcus agalactiae NOT DETECTED NOT DETECTED Final   Streptococcus pneumoniae NOT DETECTED NOT DETECTED Final   Streptococcus pyogenes NOT DETECTED NOT DETECTED Final   A.calcoaceticus-baumannii NOT DETECTED NOT DETECTED Final   Bacteroides fragilis NOT DETECTED NOT DETECTED Final   Enterobacterales NOT DETECTED NOT DETECTED Final   Enterobacter cloacae complex NOT DETECTED NOT DETECTED Final   Escherichia coli NOT DETECTED NOT DETECTED Final   Klebsiella aerogenes NOT DETECTED NOT DETECTED Final   Klebsiella oxytoca NOT DETECTED NOT DETECTED Final   Klebsiella pneumoniae NOT DETECTED NOT DETECTED Final   Proteus species NOT DETECTED NOT DETECTED Final   Salmonella species NOT DETECTED NOT DETECTED Final   Serratia marcescens NOT DETECTED NOT DETECTED Final   Haemophilus influenzae NOT DETECTED NOT DETECTED Final   Neisseria meningitidis NOT DETECTED NOT DETECTED Final   Pseudomonas aeruginosa NOT DETECTED NOT DETECTED Final   Stenotrophomonas maltophilia NOT DETECTED NOT DETECTED Final   Candida albicans NOT DETECTED NOT DETECTED Final   Candida auris NOT DETECTED NOT DETECTED Final   Candida glabrata NOT DETECTED NOT DETECTED Final   Candida krusei NOT DETECTED NOT DETECTED Final   Candida parapsilosis NOT DETECTED NOT DETECTED Final   Candida tropicalis NOT DETECTED NOT DETECTED Final   Cryptococcus neoformans/gattii NOT DETECTED NOT DETECTED  Final   Methicillin resistance mecA/C NOT DETECTED NOT DETECTED Final    Comment: Performed at [facility], [address] [place], [place] [address]  C Difficile Quick Screen w PCR reflex     Status: Abnormal   Collection Time: 09/11/23  8:18 PM   Specimen: STOOL  Result Value Ref Range Status   C Diff antigen POSITIVE (A) NEGATIVE Final    Comment: CRITICAL RESULT CALLED TO, READ BACK BY AND VERIFIED WITH: M. Renold Don 2104 09/11/23 NN    C Diff toxin NEGATIVE NEGATIVE Final   C Diff interpretation Results are indeterminate. See PCR results.  Final    Comment: Performed at [facility], [address] [place], [place] [address]  Gastrointestinal Panel by PCR , Stool     Status: None   Collection Time: 09/11/23  8:18 PM   Specimen: STOOL  Result Value Ref Range Status   Campylobacter species NOT DETECTED NOT DETECTED Final   Plesimonas shigelloides NOT DETECTED NOT DETECTED Final   Salmonella species NOT DETECTED NOT DETECTED Final   Yersinia enterocolitica NOT DETECTED NOT DETECTED Final   Vibrio species NOT DETECTED NOT DETECTED Final   Vibrio cholerae NOT DETECTED NOT DETECTED Final   Enteroaggregative E coli (EAEC) NOT DETECTED NOT DETECTED Final   Enteropathogenic E coli (EPEC) NOT DETECTED NOT DETECTED Final   Enterotoxigenic E coli (ETEC) NOT DETECTED NOT DETECTED Final   Shiga like toxin producing E coli (STEC) NOT DETECTED NOT DETECTED Final   Shigella/Enteroinvasive E coli (EIEC) NOT DETECTED NOT DETECTED Final   Cryptosporidium NOT DETECTED NOT DETECTED Final   Cyclospora cayetanensis NOT DETECTED NOT DETECTED Final   Entamoeba histolytica NOT DETECTED NOT DETECTED Final   Giardia lamblia NOT DETECTED NOT DETECTED Final   Adenovirus F40/41 NOT DETECTED NOT DETECTED Final   Astrovirus NOT DETECTED NOT  DETECTED Final   Norovirus GI/GII NOT DETECTED NOT DETECTED Final   Rotavirus A NOT DETECTED NOT DETECTED Final   Sapovirus (I, II, IV, and V) NOT DETECTED NOT  DETECTED Final    Comment: Performed at [facility], [address] [place], [place] [address]  C. Diff by PCR, Reflexed     Status: None   Collection Time: 09/11/23  8:18 PM  Result Value Ref Range Status   Toxigenic C. Difficile by PCR NEGATIVE NEGATIVE Final    Comment: Malone is colonized with non toxigenic C. difficile. May not need treatment unless significant symptoms are present. Performed at [facility], [address] [place], [place] [address]   MRSA Next Gen by PCR, Nasal     Status: None   Collection Time: 09/12/23 10:45 AM   Specimen: Nasal Mucosa; Nasal Swab  Result Value Ref Range Status   MRSA by PCR Next Gen NOT DETECTED NOT DETECTED Final    Comment: (NOTE) The GeneXpert MRSA Assay (FDA approved for NASAL specimens only), is one component of a comprehensive MRSA colonization surveillance program. It is not intended to diagnose MRSA infection nor to guide or monitor treatment for MRSA infections. Test performance is not FDA approved in patients less than 61 years old. Performed at [facility], [address], [place], [place] [address]      Labs: BNP (last 3 results) Recent Labs    06/10/23 1158 06/22/23 1418  BNP 239.0* 114.0*   Basic Metabolic Panel: Recent Labs  Lab 09/11/23 0945 09/12/23 0506 09/13/23 0436  NA 139 138 137  K 3.5 3.2* 3.4*  CL 105 103 104  CO2 24 26 24   GLUCOSE 103* 87 92  BUN 12 9 9   CREATININE 0.63 0.72 0.72  CALCIUM 8.8* 8.4* 8.1*  MG 2.1 1.8  --    Liver Function Tests: Recent Labs  Lab 09/11/23 0945  AST 19  ALT 14  ALKPHOS 52  BILITOT 0.5  PROT 6.4*  ALBUMIN 3.3*   No results for input(s): "LIPASE", "AMYLASE" in the last 168 hours. No results for input(s): "AMMONIA" in the last 168 hours. CBC: Recent Labs  Lab 09/11/23 0945 09/12/23 0506 09/13/23 0436  WBC 6.1 6.8 6.6  NEUTROABS 3.6  --   --   HGB 11.7* 11.3* 10.6*  HCT 37.0 35.6* 34.8*  MCV 110.4* 110.9* 111.9*  PLT 204 215 192    Cardiac Enzymes: No results for input(s): "CKTOTAL", "CKMB", "CKMBINDEX", "TROPONINI" in the last 168 hours. BNP: Invalid input(s): "POCBNP" CBG: No results for input(s): "GLUCAP" in the last 168 hours. D-Dimer No results for input(s): "DDIMER" in the last 72 hours. Hgb A1c No results for input(s): "HGBA1C" in the last 72 hours. Lipid Profile No results for input(s): "CHOL", "HDL", "LDLCALC", "TRIG", "CHOLHDL", "LDLDIRECT" in the last 72 hours. Thyroid function studies No results for input(s): "TSH", "T4TOTAL", "T3FREE", "THYROIDAB" in the last 72 hours.  Invalid input(s): "FREET3" Anemia work up Recent Labs    09/11/23 1625  VITAMINB12 607  FOLATE 25.4   Urinalysis    Component Value Date/Time   COLORURINE YELLOW 09/11/2023 1058   APPEARANCEUR CLEAR 09/11/2023 1058   LABSPEC 1.014 09/11/2023 1058   PHURINE 7.0 09/11/2023 1058   GLUCOSEU >=500 (A) 09/11/2023 1058   HGBUR NEGATIVE 09/11/2023 1058   BILIRUBINUR NEGATIVE 09/11/2023 1058   BILIRUBINUR small (A) 02/04/2018 1239   KETONESUR 20 (A) 09/11/2023 1058   PROTEINUR NEGATIVE 09/11/2023 1058   UROBILINOGEN 0.2 02/04/2018  1239   NITRITE NEGATIVE 09/11/2023 1058   LEUKOCYTESUR NEGATIVE 09/11/2023 1058   Sepsis Labs Recent Labs  Lab 09/11/23 0945 09/12/23 0506 09/13/23 0436  WBC 6.1 6.8 6.6   Microbiology Recent Results (from the past 240 hour(s))  Resp panel by RT-PCR (RSV, Flu A&B, Covid) Anterior Nasal Swab     Status: None   Collection Time: 09/11/23  9:13 AM   Specimen: Anterior Nasal Swab  Result Value Ref Range Status   SARS Coronavirus 2 by RT PCR NEGATIVE NEGATIVE Final    Comment: (NOTE) SARS-CoV-2 target nucleic acids are NOT DETECTED.  The SARS-CoV-2 RNA is generally detectable in upper respiratory specimens during the acute phase of infection. The lowest concentration of SARS-CoV-2 viral copies this assay can detect is 138 copies/mL. A negative result does not preclude SARS-Cov-2 infection  and should not be used as the sole basis for treatment or other Malone management decisions. A negative result may occur with  improper specimen collection/handling, submission of specimen other than nasopharyngeal swab, presence of viral mutation(s) within the areas targeted by this assay, and inadequate number of viral copies(<138 copies/mL). A negative result must be combined with clinical observations, Malone history, and epidemiological information. The expected result is Negative.  Fact Sheet for Patients:  BloggerCourse.com  Fact Sheet for Healthcare Providers:  SeriousBroker.it  This test is no t yet approved or cleared by the [place] FDA and  has been authorized for detection and/or diagnosis of SARS-CoV-2 by FDA under an Emergency Use Authorization (EUA). This EUA will remain  in effect (meaning this test can be used) for the duration of the COVID-19 declaration under Section 564(b)(1) of the Act, 21 U.S.C.section 360bbb-3(b)(1), unless the authorization is terminated  or revoked sooner.       Influenza A by PCR NEGATIVE NEGATIVE Final   Influenza B by PCR NEGATIVE NEGATIVE Final    Comment: (NOTE) The Xpert Xpress SARS-CoV-2/FLU/RSV plus assay is intended as an aid in the diagnosis of influenza from Nasopharyngeal swab specimens and should not be used as a sole basis for treatment. Nasal washings and aspirates are unacceptable for Xpert Xpress SARS-CoV-2/FLU/RSV testing.  Fact Sheet for Patients: BloggerCourse.com  Fact Sheet for Healthcare Providers: SeriousBroker.it  This test is not yet approved or cleared by the [place] FDA and has been authorized for detection and/or diagnosis of SARS-CoV-2 by FDA under an Emergency Use Authorization (EUA). This EUA will remain in effect (meaning this test can be used) for the duration of the COVID-19 declaration  under Section 564(b)(1) of the Act, 21 U.S.C. section 360bbb-3(b)(1), unless the authorization is terminated or revoked.     Resp Syncytial Virus by PCR NEGATIVE NEGATIVE Final    Comment: (NOTE) Fact Sheet for Patients: BloggerCourse.com  Fact Sheet for Healthcare Providers: SeriousBroker.it  This test is not yet approved or cleared by the [place] FDA and has been authorized for detection and/or diagnosis of SARS-CoV-2 by FDA under an Emergency Use Authorization (EUA). This EUA will remain in effect (meaning this test can be used) for the duration of the COVID-19 declaration under Section 564(b)(1) of the Act, 21 U.S.C. section 360bbb-3(b)(1), unless the authorization is terminated or revoked.  Performed at [facility], [address] [place], [place] [address]   Blood Culture (routine x 2)     Status: None (Preliminary result)   Collection Time: 09/11/23  9:45 AM   Specimen: Right Antecubital; Blood  Result Value Ref Range Status   Specimen Description  Final    RIGHT ANTECUBITAL BOTTLES DRAWN AEROBIC AND ANAEROBIC   Special Requests   Final    Blood Culture results may not be optimal due to an excessive volume of blood received in culture bottles   Culture   Final    NO GROWTH 2 DAYS Performed at [facility], [address] [place], [place] [address]    Report Status PENDING  Incomplete  Blood Culture (routine x 2)     Status: Abnormal (Preliminary result)   Collection Time: 09/11/23  9:45 AM   Specimen: Left Antecubital; Blood  Result Value Ref Range Status   Specimen Description   Final    LEFT ANTECUBITAL BOTTLES DRAWN AEROBIC AND ANAEROBIC Performed at [facility], [address] [place], [place] [address]    Special Requests   Final    Blood Culture results may not be optimal due to an excessive volume of blood received in culture bottles Performed at [facility], [address], [place], [place]  [address]    Culture  Setup Time   Final    GRAM POSITIVE COCCI IN BOTH AEROBIC AND ANAEROBIC BOTTLES Gram Stain Report Called to,Read Back By and Verified With: M RUNYON AT 0701 ON [id number] BY S DALTON    Culture (A)  Final    STAPHYLOCOCCUS CAPITIS STAPHYLOCOCCUS EPIDERMIDIS THE SIGNIFICANCE OF ISOLATING THIS ORGANISM FROM A SINGLE SET OF BLOOD CULTURES WHEN MULTIPLE SETS ARE DRAWN IS UNCERTAIN. PLEASE NOTIFY THE MICROBIOLOGY DEPARTMENT WITHIN ONE WEEK IF SPECIATION AND SENSITIVITIES ARE REQUIRED. Performed at [facility], [address], [place], [place] [address]    Report Status PENDING  Incomplete  Blood Culture ID Panel (Reflexed)     Status: Abnormal   Collection Time: 09/11/23  9:45 AM  Result Value Ref Range Status   Enterococcus faecalis NOT DETECTED NOT DETECTED Final   Enterococcus Faecium NOT DETECTED NOT DETECTED Final   Listeria monocytogenes NOT DETECTED NOT DETECTED Final   Staphylococcus species DETECTED (A) NOT DETECTED Final    Comment: CRITICAL RESULT CALLED TO, READ BACK BY AND VERIFIED WITH: W. ANDERSON PHARMD, AT 1056 09/12/23 D. VANHOOK    Staphylococcus aureus (BCID) NOT DETECTED NOT DETECTED Final   Staphylococcus epidermidis DETECTED (A) NOT DETECTED Final    Comment: CRITICAL RESULT CALLED TO, READ BACK BY AND VERIFIED WITH: W. ANDERSON PHARMD, AT 1056 09/12/23 D. VANHOOK    Staphylococcus lugdunensis NOT DETECTED NOT DETECTED Final   Streptococcus species NOT DETECTED NOT DETECTED Final   Streptococcus agalactiae NOT DETECTED NOT DETECTED Final   Streptococcus pneumoniae NOT DETECTED NOT DETECTED Final   Streptococcus pyogenes NOT DETECTED NOT DETECTED Final   A.calcoaceticus-baumannii NOT DETECTED NOT DETECTED Final   Bacteroides fragilis NOT DETECTED NOT DETECTED Final   Enterobacterales NOT DETECTED NOT DETECTED Final   Enterobacter cloacae complex NOT DETECTED NOT DETECTED Final   Escherichia coli NOT DETECTED NOT DETECTED Final   Klebsiella  aerogenes NOT DETECTED NOT DETECTED Final   Klebsiella oxytoca NOT DETECTED NOT DETECTED Final   Klebsiella pneumoniae NOT DETECTED NOT DETECTED Final   Proteus species NOT DETECTED NOT DETECTED Final   Salmonella species NOT DETECTED NOT DETECTED Final   Serratia marcescens NOT DETECTED NOT DETECTED Final   Haemophilus influenzae NOT DETECTED NOT DETECTED Final   Neisseria meningitidis NOT DETECTED NOT DETECTED Final   Pseudomonas aeruginosa NOT DETECTED NOT DETECTED Final   Stenotrophomonas maltophilia NOT DETECTED NOT DETECTED Final   Candida albicans NOT DETECTED NOT DETECTED Final  Candida auris NOT DETECTED NOT DETECTED Final   Candida glabrata NOT DETECTED NOT DETECTED Final   Candida krusei NOT DETECTED NOT DETECTED Final   Candida parapsilosis NOT DETECTED NOT DETECTED Final   Candida tropicalis NOT DETECTED NOT DETECTED Final   Cryptococcus neoformans/gattii NOT DETECTED NOT DETECTED Final   Methicillin resistance mecA/C NOT DETECTED NOT DETECTED Final    Comment: Performed at [facility], [address] [place], [place] [address]  C Difficile Quick Screen w PCR reflex     Status: Abnormal   Collection Time: 09/11/23  8:18 PM   Specimen: STOOL  Result Value Ref Range Status   C Diff antigen POSITIVE (A) NEGATIVE Final    Comment: CRITICAL RESULT CALLED TO, READ BACK BY AND VERIFIED WITH: M. Renold Don 2104 09/11/23 NN    C Diff toxin NEGATIVE NEGATIVE Final   C Diff interpretation Results are indeterminate. See PCR results.  Final    Comment: Performed at [facility], [address], [place], [place] [address]  Gastrointestinal Panel by PCR , Stool     Status: None   Collection Time: 09/11/23  8:18 PM   Specimen: STOOL  Result Value Ref Range Status   Campylobacter species NOT DETECTED NOT DETECTED Final   Plesimonas shigelloides NOT DETECTED NOT DETECTED Final   Salmonella species NOT DETECTED NOT DETECTED Final   Yersinia enterocolitica NOT DETECTED NOT DETECTED  Final   Vibrio species NOT DETECTED NOT DETECTED Final   Vibrio cholerae NOT DETECTED NOT DETECTED Final   Enteroaggregative E coli (EAEC) NOT DETECTED NOT DETECTED Final   Enteropathogenic E coli (EPEC) NOT DETECTED NOT DETECTED Final   Enterotoxigenic E coli (ETEC) NOT DETECTED NOT DETECTED Final   Shiga like toxin producing E coli (STEC) NOT DETECTED NOT DETECTED Final   Shigella/Enteroinvasive E coli (EIEC) NOT DETECTED NOT DETECTED Final   Cryptosporidium NOT DETECTED NOT DETECTED Final   Cyclospora cayetanensis NOT DETECTED NOT DETECTED Final   Entamoeba histolytica NOT DETECTED NOT DETECTED Final   Giardia lamblia NOT DETECTED NOT DETECTED Final   Adenovirus F40/41 NOT DETECTED NOT DETECTED Final   Astrovirus NOT DETECTED NOT DETECTED Final   Norovirus GI/GII NOT DETECTED NOT DETECTED Final   Rotavirus A NOT DETECTED NOT DETECTED Final   Sapovirus (I, II, IV, and V) NOT DETECTED NOT DETECTED Final    Comment: Performed at [facility], [address] [place], [place] [address]  C. Diff by PCR, Reflexed     Status: None   Collection Time: 09/11/23  8:18 PM  Result Value Ref Range Status   Toxigenic C. Difficile by PCR NEGATIVE NEGATIVE Final    Comment: Malone is colonized with non toxigenic C. difficile. May not need treatment unless significant symptoms are present. Performed at [facility], [address], [place], [place] [address]   MRSA Next Gen by PCR, Nasal     Status: None   Collection Time: 09/12/23 10:45 AM   Specimen: Nasal Mucosa; Nasal Swab  Result Value Ref Range Status   MRSA by PCR Next Gen NOT DETECTED NOT DETECTED Final    Comment: (NOTE) The GeneXpert MRSA Assay (FDA approved for NASAL specimens only), is one component of a comprehensive MRSA colonization surveillance program. It is not intended to diagnose MRSA infection nor to guide or monitor treatment for MRSA infections. Test performance is not FDA approved in patients less than 10  years old. Performed at [facility], [address], [place],  [place] [address]     Time coordinating discharge: 38 mins  SIGNED:  Standley Dakins, MD  [facility] 09/13/2023, 10:43 AM How to contact the [facility] Attending or Consulting provider 7A - 7P or covering provider during after hours 7P -7A, for this Malone?  Check the care team in [facility] and look for a) attending/consulting [facility] provider listed and b) the [facility] team listed Log into [email or web address] and use [place]'s universal password to access. If you do not have the password, please contact the hospital operator. Locate the [facility] provider you are looking for under [facility] and page to a number that you can be directly reached. If you still have difficulty reaching the provider, please page the [facility] (Director on Call) for the Hospitalists listed on amion for assistance.

## 2023-09-14 ENCOUNTER — Telehealth: Payer: Self-pay

## 2023-09-14 LAB — CULTURE, BLOOD (ROUTINE X 2)

## 2023-09-14 NOTE — Transitions of Care (Post Inpatient/ED Visit) (Unsigned)
09/14/2023  Name: Vernadean Bingenheimer MRN: [id number] DOB: 1934/07/22  Today's TOC FU Call Status: Today's TOC FU Call Status:: Unsuccessful Call (1st Attempt) Unsuccessful Call (1st Attempt) Date: 09/14/23  Attempted to reach the patient regarding the most recent Inpatient/ED visit.  Follow Up Plan: Additional outreach attempts will be made to reach the patient to complete the Transitions of Care (Post Inpatient/ED visit) call.   Signature Karena Addison, LPN [facility] Nurse Health Advisor Direct Dial [phone]

## 2023-09-15 NOTE — Transitions of Care (Post Inpatient/ED Visit) (Signed)
09/15/2023  Name: Robin Malone MRN: [id number] DOB: October 22, 1934  Today's TOC FU Call Status: Today's TOC FU Call Status:: Successful TOC FU Call Completed Unsuccessful Call (1st Attempt) Date: 09/14/23 [name]: 09/15/23 Patient's Name and Date of Birth confirmed.  Transition Care Management Follow-up Telephone Call Date of Discharge: 09/13/23 [name] Type of Discharge: Inpatient Admission Primary Inpatient Discharge Diagnosis:: pneumonia How have you been since you were released from the hospital?: Better Any questions or concerns?: No  Items Reviewed: Did you receive and understand the discharge instructions provided?: Yes Medications obtained,verified, and reconciled?: Yes (Medications Reviewed) Any new allergies since your discharge?: No Dietary orders reviewed?: Yes Do you have support at home?: Yes People in Home: child(ren), adult  Medications Reviewed Today: Medications Reviewed Today     Reviewed by Karena Addison, LPN (Licensed Practical Nurse) on 09/15/23 at 1549  Med List Status: <None>   Medication Order Taking? Sig Documenting Provider Last Dose Status Informant  acetaminophen (TYLENOL) 650 MG CR tablet [id number] No Take 650 mg by mouth 2 (two) times daily. [provider] 09/10/2023 Active Self, Pharmacy Records  amoxicillin-clavulanate (AUGMENTIN) 875-125 MG tablet [id number]  Take 1 tablet by mouth 2 (two) times daily for 5 days. Johnson, Clanford L, MD  Active   apixaban (ELIQUIS) 5 MG TABS tablet [id number] No Take 1 tablet (5 mg total) by mouth 2 (two) times daily.  Patient taking differently: Take 5 mg by mouth daily.   Sharee Holster, NP 09/10/2023 0830 Active Self, Pharmacy Records           Med Note (COFFELL, Wiliam Ke Sep 11, 2023  4:03 PM) Pt states she has never taken twice daily as directed.  Cholecalciferol (VITAMIN D-3 PO) [id number] No Take 1 capsule by mouth daily. [provider] 09/10/2023 Active Self, Pharmacy Records  Cyanocobalamin (VITAMIN B-12 PO) [id number] No Take 1 tablet by mouth daily. [provider] 09/10/2023 Active Self, Pharmacy Records  dapagliflozin propanediol (FARXIGA) 10 MG TABS tablet [id number] No Take 1 tablet (10 mg total) by mouth daily before breakfast. Sharlene Dory, NP 09/10/2023 Active Self, Pharmacy Records  dextromethorphan (DELSYM) 30 MG/5ML liquid [id number]  Take 5 mLs (30 mg total) by mouth 2 (two) times daily as needed for cough. Johnson, Clanford L, MD  Active   diclofenac Sodium (VOLTAREN) 1 % GEL [id number]  Apply 4 g topically 4 (four) times daily as needed (knee pain). Johnson, Clanford L, MD  Active   ezetimibe (ZETIA) 10 MG tablet [id number] No Take 10 mg by mouth daily. [provider] 09/10/2023 Active Self, Pharmacy Records  fluticasone [facility]) 50 MCG/ACT nasal spray [id number] No Place 1 spray into both nostrils 2 (two) times daily. [provider] 09/11/2023 Active Self, Pharmacy Records           Med Note Michaell Cowing   Thu Feb 17, 2023  8:44 AM)    furosemide (LASIX) 40 MG tablet [id number]  Take 1 tablet (40 mg total) by mouth daily. Johnson, Clanford L, MD  Active   gabapentin (NEURONTIN) 600 MG tablet [id number] No Take 1 tablet (600 mg total) by mouth 3 (three) times daily. Monica Becton, MD 09/10/2023 Active Self, Pharmacy Records  Glycerin-Hypromellose-PEG 400 (DRY EYE RELIEF DROPS OP) [id number] No Place 1 drop into both eyes 2 (two) times daily as needed (dry eyes). [provider] Past Week Active Self, Pharmacy Records  HYDROcodone-acetaminophen (NORCO/VICODIN) 5-325 MG tablet [id number] No  Take 1 tablet by mouth every 8 (eight) hours as needed for moderate pain. Monica Becton, MD 09/10/2023 Active Self, Pharmacy Records  levothyroxine (SYNTHROID) 75 MCG tablet [id number]  Take 1 tablet (75 mcg total) by mouth daily. Johnson, Clanford L, MD  Active   lisinopril (ZESTRIL)  20 MG tablet [id number] No Take 20 mg by mouth daily. [provider] 09/10/2023 Active Self, Pharmacy Records  lovastatin (MEVACOR) 40 MG tablet [id number] No Take 2 tablets (80 mg total) by mouth at bedtime. Sharee Holster, NP Past Week Active Self, Pharmacy Records  montelukast (SINGULAIR) 10 MG tablet [id number] No Take 1 tablet (10 mg total) by mouth at bedtime as needed (allergies). Sharee Holster, NP Past Month Active Self, Pharmacy Records  Multiple Vitamin (MULTIVITAMIN) tablet [id number] No Take 1 tablet by mouth daily. [provider] 09/10/2023 Active Self, Pharmacy Records  omeprazole (PRILOSEC) 40 MG capsule [id number] No Take 1 capsule (40 mg total) by mouth at bedtime. Sharee Holster, NP Past Week Active Self, Pharmacy Records  saccharomyces boulardii (FLORASTOR) 250 MG capsule [id number]  Take 1 capsule (250 mg total) by mouth 2 (two) times daily for 14 days. Johnson, Clanford L, MD  Active   spironolactone (ALDACTONE) 25 MG tablet [id number] No Take 12.5 mg by mouth daily. [provider] 09/10/2023 Active Self, Pharmacy Records           Med Note (COFFELL, Wiliam Ke Sep 11, 2023  4:09 PM) Pt states she is still taking this medication daily. Per chart review, this medication was supposedly discontinued on 08/24/23.            Home Care and Equipment/Supplies: Were Home Health Services Ordered?: NA Any new equipment or medical supplies ordered?: NA  Functional Questionnaire: Do you need assistance with bathing/showering or dressing?: No Do you need assistance with meal preparation?: No Do you need assistance with eating?: No Do you have difficulty maintaining continence: No Do you need assistance with getting out of bed/getting out of a chair/moving?: No Do you have difficulty managing or taking your medications?: No  Follow up appointments reviewed: PCP Follow-up appointment confirmed?: No (no avail appts, sent message to staff to  schedule) Specialist Hospital Follow-up appointment confirmed?: NA Do you need transportation to your follow-up appointment?: No Do you understand care options if your condition(s) worsen?: Yes-patient verbalized understanding    SIGNATURE Karena Addison, LPN [facility] Nurse Health Advisor Direct Dial [phone]

## 2023-09-16 LAB — CULTURE, BLOOD (ROUTINE X 2): Culture: NO GROWTH

## 2023-09-19 ENCOUNTER — Other Ambulatory Visit: Payer: Self-pay | Admitting: Sports Medicine

## 2023-09-19 DIAGNOSIS — M48061 Spinal stenosis, lumbar region without neurogenic claudication: Secondary | ICD-10-CM

## 2023-09-27 ENCOUNTER — Ambulatory Visit (INDEPENDENT_AMBULATORY_CARE_PROVIDER_SITE_OTHER): Payer: Medicare HMO | Admitting: Family Medicine

## 2023-09-27 ENCOUNTER — Encounter: Payer: Self-pay | Admitting: Family Medicine

## 2023-09-27 VITALS — BP 117/72 | HR 101 | Ht 63.0 in | Wt 164.1 lb

## 2023-09-27 DIAGNOSIS — J189 Pneumonia, unspecified organism: Secondary | ICD-10-CM | POA: Diagnosis not present

## 2023-09-27 NOTE — Patient Instructions (Addendum)
I appreciate the opportunity to provide care to you today!    Follow up:  12/26/2023  Labs: please stop by the lab today to get your blood drawn (CBC, BMP)  Please schedule Annual Medicare Wellness  Please visit [facility] for a chest X-ray during the week of October 24, 2023, to assess the resolution of your pneumonia.   Attached with your AVS, you will find valuable resources for self-education. I highly recommend dedicating some time to thoroughly examine them.   Please continue to a heart-healthy diet and increase your physical activities. Try to exercise for at least five days a week.    It was a pleasure to see you and I look forward to continuing to work together on your health and well-being. Please do not hesitate to call the office if you need care or have questions about your care.  In case of emergency, please visit the Emergency Department for urgent care, or contact our clinic at [phone] to schedule an appointment. We're here to help you!   Have a wonderful day and week. With Gratitude, Gilmore Laroche MSN, FNP-BC

## 2023-09-27 NOTE — Progress Notes (Signed)
Established Patient Office Visit  Subjective:  Patient ID: Robin Malone, female    DOB: [birth year]  Age: 89 y.o. MRN: [id number]  CC:  Chief Complaint  Patient presents with   Follow-up    Hospital f/u d/c on 09/13/2023, reports feeling well today.     HPI Robin Malone is a 89 y.o. female with past medical history of  HFpEF, paroxysmal A-fib, chronic venous insufficiency of legs, hypothyroidism, hyperlipidemia, hypertension, chronic back pain, GERD, osteoarthritis presents for f/u of hospital follow-up.   Multifocal Pneumonia: The patient was seen in the ED on 09/11/2023, where she was treated for community-acquired pneumonia. A CT scan of the chest, abdomen, and pelvis notably showed ground-glass opacities, suggesting a multifocal infection. The patient was treated with IV ampicillin/sulbactam every 6 hours and was discharged home on oral Augmentin twice daily for 5 days. The patient reports completing the therapy and is feeling well. She also reports that she will be following up with her GI provider in [facility] and her ENT provider in [place].   Past Medical History:  Diagnosis Date   Atrial fibrillation (HCC)    Chronic back pain    GERD (gastroesophageal reflux disease)    Hypertension    Hypothyroidism    Mixed hyperlipidemia     Past Surgical History:  Procedure Laterality Date   BACK SURGERY     Capsulotomy MPJ Right 10/01/2016   RT #2 Toe   Hammer Toe Repair Right 10/01/2016   RT #2 Toe   IR RADIOLOGIST EVAL & MGMT  08/24/2023    Family History  Problem Relation Age of Onset   Hypertension Father     Social History   Socioeconomic History   Marital status: Widowed    Spouse name: Not on file   Number of children: Not on file   Years of education: Not on file   Highest education level: 12th grade  Occupational History   Not on file  Tobacco Use   Smoking status: Former   Smokeless tobacco: Never  Vaping Use   Vaping status: Never Used   Substance and Sexual Activity   Alcohol use: Yes    Alcohol/week: 0.0 standard drinks of alcohol   Drug use: Never   Sexual activity: Not Currently  Other Topics Concern   Not on file  Social History Narrative   Not on file   Social Determinants of Health   Financial Resource Strain: Low Risk  (07/05/2020)   Received from [facility] visits prior to 02/12/2023., [facility] visits prior to 02/12/2023.   Overall Financial Resource Strain (CARDIA)    Difficulty of Paying Living Expenses: Not hard at all  Food Insecurity: No Food Insecurity (09/11/2023)   Hunger Vital Sign    Worried About Running Out of Food in the Last Year: Never true    Ran Out of Food in the Last Year: Never true  Transportation Needs: No Transportation Needs (09/11/2023)   PRAPARE - Administrator, Civil Service (Medical): No    Lack of Transportation (Non-Medical): No  Physical Activity: Unknown (05/31/2023)   Exercise Vital Sign    Days of Exercise per Week: 0 days    Minutes of Exercise per Session: Not on file  Stress: No Stress Concern Present (05/31/2023)   [employer] of Occupational Health - Occupational Stress Questionnaire    Feeling of Stress : Not at all  Social Connections: Unknown (05/31/2023)   Social Connection and  Isolation Panel [NHANES]    Frequency of Communication with Friends and Family: Three times a week    Frequency of Social Gatherings with Friends and Family: Twice a week    Attends Religious Services: Patient declined    Database administrator or Organizations: Not on file    Attends Banker Meetings: Not on file    Marital Status: Widowed  Intimate Partner Violence: Not At Risk (09/11/2023)   Humiliation, Afraid, Rape, and Kick questionnaire    Fear of Current or Ex-Partner: No    Emotionally Abused: No    Physically Abused: No    Sexually Abused: No    Outpatient Medications Prior to Visit  Medication Sig  Dispense Refill   acetaminophen (TYLENOL) 650 MG CR tablet Take 650 mg by mouth 2 (two) times daily.     apixaban (ELIQUIS) 5 MG TABS tablet Take 1 tablet (5 mg total) by mouth 2 (two) times daily. (Patient taking differently: Take 5 mg by mouth daily.) 60 tablet 0   Cholecalciferol (VITAMIN D-3 PO) Take 1 capsule by mouth daily.     Cyanocobalamin (VITAMIN B-12 PO) Take 1 tablet by mouth daily.     dapagliflozin propanediol (FARXIGA) 10 MG TABS tablet Take 1 tablet (10 mg total) by mouth daily before breakfast. 30 tablet 11   dextromethorphan (DELSYM) 30 MG/5ML liquid Take 5 mLs (30 mg total) by mouth 2 (two) times daily as needed for cough. 89 mL 0   diclofenac Sodium (VOLTAREN) 1 % GEL Apply 4 g topically 4 (four) times daily as needed (knee pain).     ezetimibe (ZETIA) 10 MG tablet Take 10 mg by mouth daily.     fluticasone (FLONASE) 50 MCG/ACT nasal spray Place 1 spray into both nostrils 2 (two) times daily.     furosemide (LASIX) 40 MG tablet Take 1 tablet (40 mg total) by mouth daily.     gabapentin (NEURONTIN) 600 MG tablet TAKE 1 TABLET(600 MG) BY MOUTH THREE TIMES DAILY 90 tablet 3   Glycerin-Hypromellose-PEG 400 (DRY EYE RELIEF DROPS OP) Place 1 drop into both eyes 2 (two) times daily as needed (dry eyes).     HYDROcodone-acetaminophen (NORCO/VICODIN) 5-325 MG tablet Take 1 tablet by mouth every 8 (eight) hours as needed for moderate pain. 90 tablet 0   levothyroxine (SYNTHROID) 75 MCG tablet Take 1 tablet (75 mcg total) by mouth daily.     lisinopril (ZESTRIL) 20 MG tablet Take 20 mg by mouth daily.     lovastatin (MEVACOR) 40 MG tablet Take 2 tablets (80 mg total) by mouth at bedtime. 60 tablet 0   montelukast (SINGULAIR) 10 MG tablet Take 1 tablet (10 mg total) by mouth at bedtime as needed (allergies). 30 tablet 0   Multiple Vitamin (MULTIVITAMIN) tablet Take 1 tablet by mouth daily.     omeprazole (PRILOSEC) 40 MG capsule Take 1 capsule (40 mg total) by mouth at bedtime. 30 capsule  0   saccharomyces boulardii (FLORASTOR) 250 MG capsule Take 1 capsule (250 mg total) by mouth 2 (two) times daily for 14 days. 28 capsule 0   spironolactone (ALDACTONE) 25 MG tablet Take 12.5 mg by mouth daily.     No facility-administered medications prior to visit.    Allergies  Allergen Reactions   Codeine Nausea And Vomiting   Levaquin [Levofloxacin] Other (See Comments)    Arthralgias    Norco [Hydrocodone-Acetaminophen] Nausea And Vomiting   Ultram [Tramadol] Nausea And Vomiting   Vibra-Tab [Doxycycline] Rash  ROS Review of Systems  Constitutional:  Negative for chills and fever.  Eyes:  Negative for visual disturbance.  Respiratory:  Negative for chest tightness and shortness of breath.   Neurological:  Negative for dizziness and headaches.      Objective:    Physical Exam HENT:     Head: Normocephalic.     Mouth/Throat:     Mouth: Mucous membranes are moist.  Cardiovascular:     Rate and Rhythm: Normal rate.     Heart sounds: Normal heart sounds.  Pulmonary:     Effort: Pulmonary effort is normal.     Breath sounds: Normal breath sounds.  Neurological:     Mental Status: She is alert.     BP 117/72   Pulse (!) 101   Ht 5\' 3"  (1.6 m)   Wt 164 lb 1.9 oz (74.4 kg)   SpO2 92%   BMI 29.07 kg/m  Wt Readings from Last 3 Encounters:  09/27/23 164 lb 1.9 oz (74.4 kg)  09/11/23 166 lb 14.2 oz (75.7 kg)  08/24/23 167 lb 1.3 oz (75.8 kg)    Lab Results  Component Value Date   TSH 2.890 08/25/2023   Lab Results  Component Value Date   WBC 8.0 09/27/2023   HGB 11.8 09/27/2023   HCT 35.6 09/27/2023   MCV 106 (H) 09/27/2023   PLT 223 09/27/2023   Lab Results  Component Value Date   NA 139 09/27/2023   K 4.5 09/27/2023   CO2 23 09/27/2023   GLUCOSE 92 09/27/2023   BUN 22 09/27/2023   CREATININE 1.20 (H) 09/27/2023   BILITOT 0.5 09/11/2023   ALKPHOS 52 09/11/2023   AST 19 09/11/2023   ALT 14 09/11/2023   PROT 6.4 (L) 09/11/2023   ALBUMIN 3.3  (L) 09/11/2023   CALCIUM 9.4 09/27/2023   ANIONGAP 9 09/13/2023   EGFR 44 (L) 09/27/2023   Lab Results  Component Value Date   CHOL 132 08/25/2023   Lab Results  Component Value Date   HDL 63 08/25/2023   Lab Results  Component Value Date   LDLCALC 45 08/25/2023   Lab Results  Component Value Date   TRIG 140 08/25/2023   Lab Results  Component Value Date   CHOLHDL 2.1 08/25/2023   Lab Results  Component Value Date   HGBA1C 6.1 (H) 08/25/2023      Assessment & Plan:  Community acquired pneumonia, unspecified laterality Assessment & Plan: Labs and Imaging Reviewed A 6-week follow-up imaging study will be obtained to assess pneumonia clearing. Pending CBC and BMP.     Orders: -     DG Chest 2 View; Future -     CBC with Differential/Platelet -     BMP8+EGFR    Follow-up: No follow-ups on file.   Gilmore Laroche, FNP

## 2023-09-28 ENCOUNTER — Telehealth: Payer: Self-pay

## 2023-09-28 ENCOUNTER — Other Ambulatory Visit: Payer: Self-pay | Admitting: Nurse Practitioner

## 2023-09-28 LAB — BMP8+EGFR
BUN/Creatinine Ratio: 18 (ref 12–28)
BUN: 22 mg/dL (ref 8–27)
CO2: 23 mmol/L (ref 20–29)
Calcium: 9.4 mg/dL (ref 8.7–10.3)
Chloride: 100 mmol/L (ref 96–106)
Creatinine, Ser: 1.2 mg/dL — ABNORMAL HIGH (ref 0.57–1.00)
Glucose: 92 mg/dL (ref 70–99)
Potassium: 4.5 mmol/L (ref 3.5–5.2)
Sodium: 139 mmol/L (ref 134–144)
eGFR: 44 mL/min/{1.73_m2} — ABNORMAL LOW (ref >59)

## 2023-09-28 LAB — CBC WITH DIFFERENTIAL/PLATELET
Basophils Absolute: 0 10*3/uL (ref 0.0–0.2)
Basos: 0 %
EOS (ABSOLUTE): 0.2 10*3/uL (ref 0.0–0.4)
Eos: 3 %
Hematocrit: 35.6 % (ref 34.0–46.6)
Hemoglobin: 11.8 g/dL (ref 11.1–15.9)
Immature Grans (Abs): 0.1 10*3/uL (ref 0.0–0.1)
Immature Granulocytes: 1 %
Lymphocytes Absolute: 2.8 10*3/uL (ref 0.7–3.1)
Lymphs: 35 %
MCH: 35.2 pg — ABNORMAL HIGH (ref 26.6–33.0)
MCHC: 33.1 g/dL (ref 31.5–35.7)
MCV: 106 fL — ABNORMAL HIGH (ref 79–97)
Monocytes Absolute: 0.6 10*3/uL (ref 0.1–0.9)
Monocytes: 8 %
Neutrophils Absolute: 4.2 10*3/uL (ref 1.4–7.0)
Neutrophils: 53 %
Platelets: 223 10*3/uL (ref 150–450)
RBC: 3.35 x10E6/uL — ABNORMAL LOW (ref 3.77–5.28)
RDW: 13.6 % (ref 11.7–15.4)
WBC: 8 10*3/uL (ref 3.4–10.8)

## 2023-09-28 NOTE — Telephone Encounter (Signed)
Spoke with patient to verify if she was still taking Spironolactone 12.5 mg she stated that Lanora Manis had stated she needed to take this medication. Advised her that will send in the refill as requested.

## 2023-09-30 NOTE — Assessment & Plan Note (Signed)
Labs and Imaging Reviewed A 6-week follow-up imaging study will be obtained to assess pneumonia clearing. Pending CBC and BMP.

## 2023-10-01 NOTE — Progress Notes (Signed)
Please inform the patient that her labs have stabilized; however, I recommend increasing her fluid intake to at least 64 ounces daily, as her labs indicate inadequate fluid consumption.

## 2023-11-04 ENCOUNTER — Other Ambulatory Visit: Payer: Self-pay | Admitting: Cardiology

## 2023-11-04 DIAGNOSIS — I4891 Unspecified atrial fibrillation: Secondary | ICD-10-CM

## 2023-11-04 MED ORDER — APIXABAN 5 MG PO TABS: 5.0000 mg | ORAL_TABLET | Freq: Two times a day (BID) | ORAL | 1 refills | Status: DC

## 2023-11-04 NOTE — Telephone Encounter (Signed)
Routed to [facility] Coumadin

## 2023-11-04 NOTE — Telephone Encounter (Signed)
*  STAT* If patient is at the pharmacy, call can be transferred to refill team.   1. Which medications need to be refilled? (please list name of each medication and dose if known)   apixaban (ELIQUIS) 5 MG TABS tablet   2. Would you like to learn more about the convenience, safety, & potential cost savings by using the [facility]?   3. Are you open to using the [facility] (Type [facility]. ).  4. Which pharmacy/location (including street and city if local pharmacy) is medication to be sent to?  [id number] - [place], [place] - [address]   5. Do they need a 30 day or 90 day supply?   90 day  Patient stated she is now completely out of this medication and will be going out of town on Tuesday, 11/26.  Patient wants to get the generic version of this medication.

## 2023-11-04 NOTE — Telephone Encounter (Signed)
Eliquis 5mg  refill request received. Patient is 89 years old, weight-74.4kg, Crea-1.20 on 09/27/23, Diagnosis-Afib, and last seen by Sharlene Dory, NP on 05/31/23. Dose is appropriate based on dosing criteria. Will send in refill to requested pharmacy.

## 2023-12-01 ENCOUNTER — Ambulatory Visit: Payer: Medicare HMO | Admitting: Nurse Practitioner

## 2023-12-05 ENCOUNTER — Ambulatory Visit: Payer: Medicare HMO | Admitting: Nurse Practitioner

## 2023-12-26 ENCOUNTER — Ambulatory Visit: Payer: Self-pay | Admitting: Family Medicine

## 2023-12-26 ENCOUNTER — Other Ambulatory Visit (HOSPITAL_COMMUNITY)
Admission: RE | Admit: 2023-12-26 | Discharge: 2023-12-26 | Disposition: A | Discharge status: Home / Self Care | Payer: Medicare HMO | Source: Ambulatory Visit | Attending: Medical | Admitting: Medical

## 2023-12-26 ENCOUNTER — Other Ambulatory Visit: Payer: Self-pay | Admitting: *Deleted

## 2023-12-26 ENCOUNTER — Encounter: Payer: Self-pay | Admitting: Medical

## 2023-12-26 ENCOUNTER — Ambulatory Visit: Payer: Medicare HMO | Attending: Medical | Admitting: Medical

## 2023-12-26 VITALS — BP 110/58 | HR 76 | Wt 164.0 lb

## 2023-12-26 DIAGNOSIS — I5032 Chronic diastolic (congestive) heart failure: Secondary | ICD-10-CM

## 2023-12-26 DIAGNOSIS — I4891 Unspecified atrial fibrillation: Secondary | ICD-10-CM

## 2023-12-26 DIAGNOSIS — Z79899 Other long term (current) drug therapy: Secondary | ICD-10-CM

## 2023-12-26 DIAGNOSIS — I480 Paroxysmal atrial fibrillation: Secondary | ICD-10-CM | POA: Diagnosis not present

## 2023-12-26 DIAGNOSIS — E782 Mixed hyperlipidemia: Secondary | ICD-10-CM

## 2023-12-26 DIAGNOSIS — I872 Venous insufficiency (chronic) (peripheral): Secondary | ICD-10-CM | POA: Diagnosis not present

## 2023-12-26 DIAGNOSIS — I10 Essential (primary) hypertension: Secondary | ICD-10-CM | POA: Diagnosis not present

## 2023-12-26 LAB — BASIC METABOLIC PANEL
Anion gap: 9 (ref 5–15)
BUN: 21 mg/dL (ref 8–23)
CO2: 26 mmol/L (ref 22–32)
Calcium: 9.2 mg/dL (ref 8.9–10.3)
Chloride: 101 mmol/L (ref 98–111)
Creatinine, Ser: 0.97 mg/dL (ref 0.44–1.00)
GFR, Estimated: 57 mL/min — ABNORMAL LOW (ref >60)
Glucose, Bld: 101 mg/dL — ABNORMAL HIGH (ref 70–99)
Potassium: 4.4 mmol/L (ref 3.5–5.1)
Sodium: 136 mmol/L (ref 135–145)

## 2023-12-26 MED ORDER — APIXABAN 5 MG PO TABS: 5.0000 mg | ORAL_TABLET | Freq: Two times a day (BID) | ORAL | 1 refills | Status: DC

## 2023-12-26 NOTE — Telephone Encounter (Signed)
 Prescription refill request for Eliquis received. Indication: PAF Last office visit: 12/26/23  Peggyann Juba PA-C Scr: 1.20 on 09/27/23  Epic Age: 89 Weight: 74.4kg  Based on above findings Eliquis 5mg  twice daily is the appropriate dose.  Refill approved.

## 2023-12-26 NOTE — Progress Notes (Signed)
 Cardiology Office Note:    Date:  12/26/2023   ID:  Robin Malone, DOB 27-Apr-1934, MRN [id number]  PCP:  Zarwolo, Gloria, FNP  [facility] Cardiologist:  Jayson Sierras, MD  [facility] Electrophysiologist:  None   Referring MD: Ryan Powell HERO., MD   Chief Complaint: 6 month follow-up  History of Present Illness:    Robin Malone is a 89 y.o. female with a hx of HFpEF, paroxysmal Afib, HTN, hypothyroidism, mixed HLD, chronic back pain and GERD who presents for follow-up.   The patient was hospitalized in March 2024 for back pain and found to be in Afib RVR, lasted around 45 minutes. BP was low normal. Echo showed LVEF 55%, G2DD, mild LAE, discharged to SNF on metoprolol  and Eliquis .   Last seen 05/2023 and was overall stable from a cardiac perspective. Lasix  was increased temporarily due to recent leg wound with drainage.   Today, the patient reports she is overall doing well. BP is good today. She denies chest pain, SOB. She has chronic unchanged edema. No lightheadedness or dizziness. She uses a cane. She denies palpitations.    Past Medical History:  Diagnosis Date   Atrial fibrillation (HCC)    Chronic back pain    GERD (gastroesophageal reflux disease)    Hypertension    Hypothyroidism    Mixed hyperlipidemia     Past Surgical History:  Procedure Laterality Date   BACK SURGERY     Capsulotomy MPJ Right 10/01/2016   RT #2 Toe   Hammer Toe Repair Right 10/01/2016   RT #2 Toe   IR RADIOLOGIST EVAL & MGMT  08/24/2023    Current Medications: Current Meds  Medication Sig   acetaminophen  (TYLENOL ) 650 MG CR tablet Take 650 mg by mouth 2 (two) times daily.   Cholecalciferol (VITAMIN D -3 PO) Take 1 capsule by mouth daily.   Cyanocobalamin  (VITAMIN B-12 PO) Take 1 tablet by mouth daily.   dapagliflozin  propanediol (FARXIGA ) 10 MG TABS tablet Take 1 tablet (10 mg total) by mouth daily before breakfast.   dextromethorphan  (DELSYM ) 30 MG/5ML liquid Take 5 mLs (30 mg  total) by mouth 2 (two) times daily as needed for cough.   diclofenac  Sodium (VOLTAREN ) 1 % GEL Apply 4 g topically 4 (four) times daily as needed (knee pain).   ezetimibe  (ZETIA ) 10 MG tablet Take 10 mg by mouth daily.   fluticasone  (FLONASE ) 50 MCG/ACT nasal spray Place 1 spray into both nostrils 2 (two) times daily.   furosemide  (LASIX ) 40 MG tablet Take 1 tablet (40 mg total) by mouth daily.   gabapentin  (NEURONTIN ) 600 MG tablet TAKE 1 TABLET(600 MG) BY MOUTH THREE TIMES DAILY   Glycerin-Hypromellose-PEG 400 (DRY EYE RELIEF DROPS OP) Place 1 drop into both eyes 2 (two) times daily as needed (dry eyes).   HYDROcodone -acetaminophen  (NORCO/VICODIN) 5-325 MG tablet Take 1 tablet by mouth every 8 (eight) hours as needed for moderate pain.   levothyroxine  (SYNTHROID ) 75 MCG tablet Take 1 tablet (75 mcg total) by mouth daily.   lisinopril  (ZESTRIL ) 20 MG tablet Take 20 mg by mouth daily.   lovastatin  (MEVACOR ) 40 MG tablet Take 2 tablets (80 mg total) by mouth at bedtime.   montelukast  (SINGULAIR ) 10 MG tablet Take 1 tablet (10 mg total) by mouth at bedtime as needed (allergies).   Multiple Vitamin (MULTIVITAMIN) tablet Take 1 tablet by mouth daily.   omeprazole  (PRILOSEC) 40 MG capsule Take 1 capsule (40 mg total) by mouth at bedtime.   spironolactone  (ALDACTONE )  25 MG tablet TAKE 1/2 TABLET(12.5 MG) BY MOUTH DAILY     Allergies:   Codeine, Levaquin [levofloxacin], Norco [hydrocodone -acetaminophen ], Ultram  [tramadol ], and Vibra-tab [doxycycline]   Social History   Socioeconomic History   Marital status: Widowed    Spouse name: Not on file   Number of children: Not on file   Years of education: Not on file   Highest education level: 12th grade  Occupational History   Not on file  Tobacco Use   Smoking status: Former   Smokeless tobacco: Never  Vaping Use   Vaping status: Never Used  Substance and Sexual Activity   Alcohol  use: Yes    Alcohol /week: 0.0 standard drinks of alcohol     Drug use: Never   Sexual activity: Not Currently  Other Topics Concern   Not on file  Social History Narrative   Not on file   Social Drivers of Health   Financial Resource Strain: Low Risk  (07/05/2020)   Received from [facility] visits prior to 02/12/2023., [facility] visits prior to 02/12/2023.   Overall Financial Resource Strain (CARDIA)    Difficulty of Paying Living Expenses: Not hard at all  Food Insecurity: No Food Insecurity (09/11/2023)   Hunger Vital Sign    Worried About Running Out of Food in the Last Year: Never true    Ran Out of Food in the Last Year: Never true  Transportation Needs: No Transportation Needs (09/11/2023)   PRAPARE - Administrator, Civil Service (Medical): No    Lack of Transportation (Non-Medical): No  Physical Activity: Unknown (05/31/2023)   Exercise Vital Sign    Days of Exercise per Week: 0 days    Minutes of Exercise per Session: Not on file  Stress: No Stress Concern Present (05/31/2023)   [employer] of Occupational Health - Occupational Stress Questionnaire    Feeling of Stress : Not at all  Social Connections: Unknown (05/31/2023)   Social Connection and Isolation Panel [NHANES]    Frequency of Communication with Friends and Family: Three times a week    Frequency of Social Gatherings with Friends and Family: Twice a week    Attends Religious Services: Patient declined    Database Administrator or Organizations: Not on file    Attends Banker Meetings: Not on file    Marital Status: Widowed     Family History: The patient's family history includes Hypertension in her father.  ROS:   Please see the history of present illness.     All other systems reviewed and are negative.  EKGs/Labs/Other Studies Reviewed:    The following studies were reviewed today:  Echo 02/2023  1. Left ventricular ejection fraction, by estimation, is 55%. The left  ventricle has normal  function. The left ventricle has no regional wall  motion abnormalities. Left ventricular diastolic parameters are consistent  with Grade II diastolic dysfunction  (pseudonormalization).   2. Right ventricular systolic function is normal. The right ventricular  size is mildly enlarged. There is mildly elevated pulmonary artery  systolic pressure. The estimated right ventricular systolic pressure is  39.2 mmHg.   3. Left atrial size was mildly dilated.   4. The mitral valve is grossly normal. Trivial mitral valve  regurgitation. No evidence of mitral stenosis.   5. The aortic valve is grossly normal. There is mild thickening of the  aortic valve. Aortic valve regurgitation is not visualized. No aortic  stenosis is present.  6. The inferior vena cava is normal in size with greater than 50%  respiratory variability, suggesting right atrial pressure of 3 mmHg.   EKG:  EKG is not ordered today.   Recent Labs: 06/22/2023: B Natriuretic Peptide 114.0 08/25/2023: TSH 2.890 09/11/2023: ALT 14 09/12/2023: Magnesium  1.8 09/27/2023: BUN 22; Creatinine, Ser 1.20; Hemoglobin 11.8; Platelets 223; Potassium 4.5; Sodium 139  Recent Lipid Panel    Component Value Date/Time   CHOL 132 08/25/2023 1131   TRIG 140 08/25/2023 1131   HDL 63 08/25/2023 1131   CHOLHDL 2.1 08/25/2023 1131   LDLCALC 45 08/25/2023 1131    Physical Exam:    VS:  BP (!) 110/58 (BP Location: Left Arm, Patient Position: Sitting, Cuff Size: Normal)   Pulse 76   Wt 164 lb (74.4 kg)   SpO2 94%   BMI 29.05 kg/m     Wt Readings from Last 3 Encounters:  12/26/23 164 lb (74.4 kg)  09/27/23 164 lb 1.9 oz (74.4 kg)  09/11/23 166 lb 14.2 oz (75.7 kg)     GEN:  Well nourished, well developed in no acute distress HEENT: Normal NECK: No JVD; No carotid bruits LYMPHATICS: No lymphadenopathy CARDIAC: RRR, no murmurs, rubs, gallops RESPIRATORY:  Clear to auscultation without rales, wheezing or rhonchi  ABDOMEN: Soft, non-tender,  non-distended MUSCULOSKELETAL:  mild lower leg edema; No deformity  SKIN: Warm and dry NEUROLOGIC:  Alert and oriented x 3 PSYCHIATRIC:  Normal affect   ASSESSMENT:    1. Paroxysmal atrial fibrillation (HCC)   2. Medication management   3. Chronic diastolic heart failure (HCC)   4. Chronic venous insufficiency   5. Essential hypertension   6. Hyperlipidemia, mixed    PLAN:    In order of problems listed above:  Paroxysmal A-fib Patient is in normal sinus rhythm on exam today.  She needs a refill of Eliquis  5 mg twice daily, we will send this in. She denies any bleeding issues.  She is not on anything for rate control.  Denies any palpitations or heart racing.  HFpEF Chronic venous insufficiency Patient has chronic unchanged lower leg edema. Echo in 2024 showed EF of 55%, grade 2 diastolic dysfunction.  Patient takes Lasix  40 mg daily, lisinopril  20 mg daily and spironolactone  12.5 mg daily.  Last labs showed mild dehydration.  I will repeat a BMET today.  HTN Blood pressure well-controlled, continue current medications  Hyperlipidemia Well-controlled on last check.  Continue pravastatin .  Disposition: Follow up in 6 month(s) with MD/APP    Signed, Maude Hettich VEAR Fishman, PA-C  12/26/2023 2:41 PM    [facility]

## 2023-12-26 NOTE — Patient Instructions (Signed)
Medication Instructions:  Your physician recommends that you continue on your current medications as directed. Please refer to the Current Medication list given to you today.   Labwork: BMET today  Testing/Procedures: None today  Follow-Up: 6 months  Any Other Special Instructions Will Be Listed Below (If Applicable).  If you need a refill on your cardiac medications before your next appointment, please call your pharmacy.  

## 2023-12-30 ENCOUNTER — Telehealth: Payer: Self-pay | Admitting: Family Medicine

## 2023-12-30 NOTE — Telephone Encounter (Signed)
 error

## 2024-01-06 ENCOUNTER — Telehealth: Payer: Self-pay | Admitting: Family Medicine

## 2024-01-06 NOTE — Telephone Encounter (Signed)
Copied from CRM [id number]. Topic: Clinical - Medication Question >> Jan 06, 2024 11:31 AM Donita Brooks wrote: Reason for CRM: pt was prescribe elquis and farxiga. Those medications are too expensive for pt. Pt call insurance and insurance advise pt cheaper medications - warfarin and jardiance

## 2024-01-08 NOTE — Telephone Encounter (Signed)
Please encourage the patient to follow up with cardiology regarding changes to her medication.

## 2024-01-09 NOTE — Telephone Encounter (Signed)
She will need to contact her cardiologist office because they were prescribed by them and they will have to be the ones to change them to something cheaper

## 2024-01-11 ENCOUNTER — Telehealth: Payer: Self-pay | Admitting: Cardiology

## 2024-01-11 DIAGNOSIS — Z79899 Other long term (current) drug therapy: Secondary | ICD-10-CM

## 2024-01-11 DIAGNOSIS — I480 Paroxysmal atrial fibrillation: Secondary | ICD-10-CM

## 2024-01-11 NOTE — Telephone Encounter (Signed)
Called patient already spoke to cardiologist.

## 2024-01-11 NOTE — Telephone Encounter (Signed)
I will forward to PharmD to review  Robin Malone, is this another issue with patient needing to pay annual co-pay?

## 2024-01-11 NOTE — Telephone Encounter (Signed)
Pt c/o medication issue:  1. Name of Medication: apixaban (ELIQUIS) 5 MG TABS tablet   2. How are you currently taking this medication (dosage and times per day)? 5 mg, 2 times daily   3. Are you having a reaction (difficulty breathing--STAT)? no  4. What is your medication issue? Pt could not afford $300 to pick up med. Requesting cb  Also needs to discuss issue with dapagliflozin propanediol (FARXIGA) 10 MG TABS tablet

## 2024-01-12 NOTE — Telephone Encounter (Signed)
Yes, patient should call number on back of card to enroll in [facility] payment plan and to learn what her deductible is

## 2024-01-12 NOTE — Telephone Encounter (Signed)
Reports she has already called her insurance and was told that her plan deductible is $250. Reports her co-pay is going to cost over $100 each time and she can't afford this. Reports her insurance advised her to switch to warfarin in place of eliquis and switch to jardiance in place of farxiga. Reports these would be much cheaper. Patient is asking provider to switch these medications as her insurance has suggested. Reports she has been out of eliquis for 3-4 weeks. Discussed patient assistance for eliquis and farxiga and patient declined patient assistance. Will send to provider for recommendations.

## 2024-01-13 DIAGNOSIS — Z79899 Other long term (current) drug therapy: Secondary | ICD-10-CM | POA: Insufficient documentation

## 2024-01-13 DIAGNOSIS — I480 Paroxysmal atrial fibrillation: Secondary | ICD-10-CM | POA: Insufficient documentation

## 2024-01-13 DIAGNOSIS — Z0001 Encounter for general adult medical examination with abnormal findings: Secondary | ICD-10-CM | POA: Insufficient documentation

## 2024-01-13 MED ORDER — EMPAGLIFLOZIN 10 MG PO TABS: 10.0000 mg | ORAL_TABLET | Freq: Every day | ORAL | 6 refills | Status: DC

## 2024-01-13 NOTE — Telephone Encounter (Signed)
 Patient informed and verbalized understanding of plan.

## 2024-01-16 NOTE — Telephone Encounter (Signed)
Called pt to switch from Eliquis to warfarin.  After learning she will have to come in for blood checks she does not want to go on warfarin.  States she has been out of Eliquis since December and has done fine without it.  Explained increased risk of developing a blood clot or stoke off of anticoagulants.  She verbalized understanding and is willing to take that risk until the price of Eliquis comes down  She will check with insurance again.  I did recommend her take ASA 325mg  1 tablet daily while off of Eliquis if OK with Dr Diona Browner.  Message forwarded to him.

## 2024-01-16 NOTE — Telephone Encounter (Signed)
Please schedule pt for new coumadin appt.  Thanks

## 2024-01-16 NOTE — Telephone Encounter (Signed)
Called pt to answer her questions re. Warfarin.  N/A.  Will call back.

## 2024-01-18 ENCOUNTER — Telehealth: Payer: Self-pay | Admitting: Cardiology

## 2024-01-18 DIAGNOSIS — I4891 Unspecified atrial fibrillation: Secondary | ICD-10-CM

## 2024-01-18 MED ORDER — EMPAGLIFLOZIN 10 MG PO TABS: 10.0000 mg | ORAL_TABLET | Freq: Every day | ORAL | 3 refills | Status: DC

## 2024-01-18 MED ORDER — APIXABAN 5 MG PO TABS: 5.0000 mg | ORAL_TABLET | Freq: Two times a day (BID) | ORAL | 1 refills | Status: AC

## 2024-01-18 NOTE — Telephone Encounter (Signed)
 Prescription refill request for Eliquis  received. Indication: Afib  Last office visit: 12/26/23 Gennaro Khat)  Scr: 0.97 (12/26/23) Age: 89 Weight: 74.4kg  Appropriate dose. Refill sent.

## 2024-01-18 NOTE — Telephone Encounter (Signed)
 Refilled Jardiance , will forward eliquis  to anticoag

## 2024-01-18 NOTE — Telephone Encounter (Signed)
*  STAT* If patient is at the pharmacy, call can be transferred to refill team.   1. Which medications need to be refilled? (please list name of each medication and dose if known)   apixaban  (ELIQUIS ) 5 MG TABS tablet  empagliflozin  (JARDIANCE ) 10 MG TABS tablet   2. Would you like to learn more about the convenience, safety, & potential cost savings by using the [facility]?   3. Are you open to using the [facility] (Type [facility]. ).  4. Which pharmacy/location (including street and city if local pharmacy) is medication to be sent to?  [facility] - [place], [place] - [address]   5. Do they need a 30 day or 90 day supply?   90 day  Patient stated she still has some medication.

## 2024-02-03 ENCOUNTER — Other Ambulatory Visit: Payer: Self-pay

## 2024-02-03 MED ORDER — LISINOPRIL 20 MG PO TABS: 20.0000 mg | ORAL_TABLET | Freq: Every day | ORAL | 2 refills | Status: DC

## 2024-02-13 ENCOUNTER — Other Ambulatory Visit: Payer: Self-pay | Admitting: Family Medicine

## 2024-02-13 NOTE — Telephone Encounter (Unsigned)
 Copied from CRM [id number]. Topic: Clinical - Medication Refill >> Feb 13, 2024  4:32 PM Antony Haste wrote: Most Recent Primary Care Visit:  Provider: Gilmore Laroche  Department: [facility]  Visit Type: HOSPITAL FU  Date: 09/27/2023  Medication: levothyroxine (SYNTHROID) 75 MCG tablet omeprazole (PRILOSEC) 40 MG capsule spironolactone (ALDACTONE) 25 MG tablet  Has the patient contacted their pharmacy? Yes (Agent: If no, request that the patient contact the pharmacy for the refill. If patient does not wish to contact the pharmacy document the reason why and proceed with request.) (Agent: If yes, when and what did the pharmacy advise?)  Is this the correct pharmacy for this prescription? Yes If no, delete pharmacy and type the correct one.  This is the patient's preferred pharmacy:    [facility] - [place], [place] - [address] [place] [address] Phone: [phone] Fax: [phone]   Has the prescription been filled recently? No  Is the patient out of the medication? Yes  Has the patient been seen for an appointment in the last year OR does the patient have an upcoming appointment? Yes  Can we respond through MyChart? No, callback preferred.  Agent: Please be advised that Rx refills may take up to 3 business days. We ask that you follow-up with your pharmacy.

## 2024-02-14 ENCOUNTER — Ambulatory Visit: Payer: Medicare HMO | Admitting: Family Medicine

## 2024-02-14 MED ORDER — SPIRONOLACTONE 25 MG PO TABS: 12.5000 mg | ORAL_TABLET | Freq: Every day | ORAL | 2 refills | Status: DC

## 2024-02-14 MED ORDER — LEVOTHYROXINE SODIUM 75 MCG PO TABS: 75.0000 ug | ORAL_TABLET | Freq: Every day | ORAL | Status: DC

## 2024-02-14 MED ORDER — OMEPRAZOLE 40 MG PO CPDR: 40.0000 mg | DELAYED_RELEASE_CAPSULE | Freq: Every day | ORAL | 0 refills | Status: DC

## 2024-02-16 ENCOUNTER — Ambulatory Visit (INDEPENDENT_AMBULATORY_CARE_PROVIDER_SITE_OTHER): Payer: Medicare HMO | Admitting: Family Medicine

## 2024-02-16 ENCOUNTER — Encounter: Payer: Self-pay | Admitting: Family Medicine

## 2024-02-16 VITALS — BP 122/71 | HR 88 | Resp 16 | Ht 63.0 in | Wt 164.0 lb

## 2024-02-16 DIAGNOSIS — I10 Essential (primary) hypertension: Secondary | ICD-10-CM | POA: Diagnosis not present

## 2024-02-16 DIAGNOSIS — E038 Other specified hypothyroidism: Secondary | ICD-10-CM

## 2024-02-16 DIAGNOSIS — E559 Vitamin D deficiency, unspecified: Secondary | ICD-10-CM

## 2024-02-16 DIAGNOSIS — E782 Mixed hyperlipidemia: Secondary | ICD-10-CM | POA: Diagnosis not present

## 2024-02-16 MED ORDER — EZETIMIBE 10 MG PO TABS
10.0000 mg | ORAL_TABLET | Freq: Every day | ORAL | 1 refills | Status: DC
Start: 2024-02-16 — End: 2024-04-19

## 2024-02-16 MED ORDER — LOVASTATIN 40 MG PO TABS
80.0000 mg | ORAL_TABLET | Freq: Every day | ORAL | 1 refills | Status: DC
Start: 2024-02-16 — End: 2024-04-27

## 2024-02-16 MED ORDER — SPIRONOLACTONE 25 MG PO TABS
12.5000 mg | ORAL_TABLET | Freq: Every day | ORAL | 2 refills | Status: DC
Start: 2024-02-16 — End: 2024-04-19

## 2024-02-16 MED ORDER — LISINOPRIL 10 MG PO TABS
10.0000 mg | ORAL_TABLET | Freq: Every day | ORAL | 1 refills | Status: AC
Start: 2024-02-16 — End: ?

## 2024-02-16 MED ORDER — VITAMIN D (ERGOCALCIFEROL) 1.25 MG (50000 UNIT) PO CAPS: 50000.0000 [IU] | ORAL_CAPSULE | ORAL | 1 refills | Status: DC

## 2024-02-16 NOTE — Progress Notes (Signed)
 Established Patient Office Visit  Subjective:  Patient ID: Robin Malone, female    DOB: 1934-03-16  Age: 89 y.o. MRN: [id number]  CC:  Chief Complaint  Patient presents with   Follow-up   Medication Problem    She states she is on lisinopril 10 and it was refilled by cardiology for lisinopril 20 when they had told her to stay on her current dose of 10mg  and wants to know if she needs to take vitamin d weekly because she is out of it     HPI Robin Malone is a 89 y.o. female with past medical history of hypertension, hypothyroidism, and mixed hyperlipidemia presents for f/u of  chronic medical conditions. For the details of today's visit, please refer to the assessment and plan.     Past Medical History:  Diagnosis Date   Atrial fibrillation (HCC)    Chronic back pain    GERD (gastroesophageal reflux disease)    Hypertension    Hypothyroidism    Mixed hyperlipidemia     Past Surgical History:  Procedure Laterality Date   BACK SURGERY     Capsulotomy MPJ Right 10/01/2016   RT #2 Toe   Hammer Toe Repair Right 10/01/2016   RT #2 Toe   IR RADIOLOGIST EVAL & MGMT  08/24/2023    Family History  Problem Relation Age of Onset   Hypertension Father     Social History   Socioeconomic History   Marital status: Widowed    Spouse name: Not on file   Number of children: Not on file   Years of education: Not on file   Highest education level: Some college, no degree  Occupational History   Not on file  Tobacco Use   Smoking status: Former   Smokeless tobacco: Never  Vaping Use   Vaping status: Never Used  Substance and Sexual Activity   Alcohol use: Yes    Alcohol/week: 0.0 standard drinks of alcohol   Drug use: Never   Sexual activity: Not Currently  Other Topics Concern   Not on file  Social History Narrative   Not on file   Social Drivers of Health   Financial Resource Strain: Low Risk  (02/15/2024)   Overall Financial Resource Strain (CARDIA)    Difficulty  of Paying Living Expenses: Not hard at all  Food Insecurity: No Food Insecurity (02/15/2024)   Hunger Vital Sign    Worried About Running Out of Food in the Last Year: Never true    Ran Out of Food in the Last Year: Never true  Transportation Needs: No Transportation Needs (02/15/2024)   PRAPARE - Administrator, Civil Service (Medical): No    Lack of Transportation (Non-Medical): No  Physical Activity: Insufficiently Active (02/15/2024)   Exercise Vital Sign    Days of Exercise per Week: 1 day    Minutes of Exercise per Session: 10 min  Stress: No Stress Concern Present (02/15/2024)   [employer] of Occupational Health - Occupational Stress Questionnaire    Feeling of Stress : Not at all  Social Connections: Unknown (02/15/2024)   Social Connection and Isolation Panel [NHANES]    Frequency of Communication with Friends and Family: Twice a week    Frequency of Social Gatherings with Friends and Family: Once a week    Attends Religious Services: Patient declined    Database administrator or Organizations: Yes    Attends Banker Meetings: Patient declined    Marital Status: Widowed  Intimate Partner Violence: Not At Risk (09/11/2023)   Humiliation, Afraid, Rape, and Kick questionnaire    Fear of Current or Ex-Partner: No    Emotionally Abused: No    Physically Abused: No    Sexually Abused: No    Outpatient Medications Prior to Visit  Medication Sig Dispense Refill   acetaminophen (TYLENOL) 650 MG CR tablet Take 650 mg by mouth 2 (two) times daily.     apixaban (ELIQUIS) 5 MG TABS tablet Take 1 tablet (5 mg total) by mouth 2 (two) times daily. 180 tablet 1   Cholecalciferol (VITAMIN D-3 PO) Take 1 capsule by mouth daily.     Cyanocobalamin (VITAMIN B-12 PO) Take 1 tablet by mouth daily.     diclofenac Sodium (VOLTAREN) 1 % GEL Apply 4 g topically 4 (four) times daily as needed (knee pain).     empagliflozin (JARDIANCE) 10 MG TABS tablet Take 1 tablet (10 mg  total) by mouth daily before breakfast. 90 tablet 3   fluticasone (FLONASE) 50 MCG/ACT nasal spray Place 1 spray into both nostrils 2 (two) times daily.     furosemide (LASIX) 40 MG tablet Take 1 tablet (40 mg total) by mouth daily.     Glycerin-Hypromellose-PEG 400 (DRY EYE RELIEF DROPS OP) Place 1 drop into both eyes 2 (two) times daily as needed (dry eyes).     HYDROcodone-acetaminophen (NORCO/VICODIN) 5-325 MG tablet Take 1 tablet by mouth every 8 (eight) hours as needed for moderate pain. 90 tablet 0   levothyroxine (SYNTHROID) 75 MCG tablet Take 1 tablet (75 mcg total) by mouth daily.     montelukast (SINGULAIR) 10 MG tablet Take 1 tablet (10 mg total) by mouth at bedtime as needed (allergies). 30 tablet 0   Multiple Vitamin (MULTIVITAMIN) tablet Take 1 tablet by mouth daily.     omeprazole (PRILOSEC) 40 MG capsule Take 1 capsule (40 mg total) by mouth at bedtime. 30 capsule 0   dextromethorphan (DELSYM) 30 MG/5ML liquid Take 5 mLs (30 mg total) by mouth 2 (two) times daily as needed for cough. 89 mL 0   ezetimibe (ZETIA) 10 MG tablet Take 10 mg by mouth daily.     lovastatin (MEVACOR) 40 MG tablet Take 2 tablets (80 mg total) by mouth at bedtime. 60 tablet 0   spironolactone (ALDACTONE) 25 MG tablet Take 0.5 tablets (12.5 mg total) by mouth daily. 45 tablet 2   gabapentin (NEURONTIN) 600 MG tablet TAKE 1 TABLET(600 MG) BY MOUTH THREE TIMES DAILY (Patient not taking: Reported on 02/16/2024) 90 tablet 3   lisinopril (ZESTRIL) 20 MG tablet Take 1 tablet (20 mg total) by mouth daily. (Patient not taking: Reported on 02/16/2024) 90 tablet 2   No facility-administered medications prior to visit.    Allergies  Allergen Reactions   Codeine Nausea And Vomiting   Levaquin [Levofloxacin] Other (See Comments)    Arthralgias    Norco [Hydrocodone-Acetaminophen] Nausea And Vomiting   Ultram [Tramadol] Nausea And Vomiting   Vibra-Tab [Doxycycline] Rash    ROS Review of Systems  Constitutional:   Negative for chills and fever.  Eyes:  Negative for visual disturbance.  Respiratory:  Negative for chest tightness and shortness of breath.   Neurological:  Negative for dizziness and headaches.      Objective:    Physical Exam HENT:     Head: Normocephalic.     Mouth/Throat:     Mouth: Mucous membranes are moist.  Cardiovascular:     Rate and Rhythm: Normal rate.  Heart sounds: Normal heart sounds.  Pulmonary:     Effort: Pulmonary effort is normal.     Breath sounds: Normal breath sounds.  Neurological:     Mental Status: She is alert.     BP 122/71   Pulse 88   Resp 16   Ht 5\' 3"  (1.6 m)   Wt 164 lb (74.4 kg)   SpO2 92%   BMI 29.05 kg/m  Wt Readings from Last 3 Encounters:  02/16/24 164 lb (74.4 kg)  12/26/23 164 lb (74.4 kg)  09/27/23 164 lb 1.9 oz (74.4 kg)    Lab Results  Component Value Date   TSH 2.890 08/25/2023   Lab Results  Component Value Date   WBC 8.0 09/27/2023   HGB 11.8 09/27/2023   HCT 35.6 09/27/2023   MCV 106 (H) 09/27/2023   PLT 223 09/27/2023   Lab Results  Component Value Date   NA 136 12/26/2023   K 4.4 12/26/2023   CO2 26 12/26/2023   GLUCOSE 101 (H) 12/26/2023   BUN 21 12/26/2023   CREATININE 0.97 12/26/2023   BILITOT 0.5 09/11/2023   ALKPHOS 52 09/11/2023   AST 19 09/11/2023   ALT 14 09/11/2023   PROT 6.4 (L) 09/11/2023   ALBUMIN 3.3 (L) 09/11/2023   CALCIUM 9.2 12/26/2023   ANIONGAP 9 12/26/2023   EGFR 44 (L) 09/27/2023   Lab Results  Component Value Date   CHOL 132 08/25/2023   Lab Results  Component Value Date   HDL 63 08/25/2023   Lab Results  Component Value Date   LDLCALC 45 08/25/2023   Lab Results  Component Value Date   TRIG 140 08/25/2023   Lab Results  Component Value Date   CHOLHDL 2.1 08/25/2023   Lab Results  Component Value Date   HGBA1C 6.1 (H) 08/25/2023      Assessment & Plan:  Primary hypertension Assessment & Plan: The patient's blood pressure is well-controlled on  lisinopril 10 mg daily. She is also taking furosemide 40 mg daily for peripheral edema. The patient was advised to check her blood pressure before taking her medication and to hold her blood pressure medication if her reading is below 90/60 mmHg. She verbalized understanding of these instructions. Additionally, the patient was encouraged to follow a heart-healthy diet and increase physical activity BP Readings from Last 3 Encounters:  02/16/24 122/71  12/26/23 (!) 110/58  09/27/23 117/72     Orders: -     Spironolactone; Take 0.5 tablets (12.5 mg total) by mouth daily.  Dispense: 60 tablet; Refill: 2 -     Lisinopril; Take 1 tablet (10 mg total) by mouth daily.  Dispense: 90 tablet; Refill: 1  Other specified hypothyroidism Assessment & Plan: She takes Synthroid 75 mcg daily The patient is encouraged to follow up if experiencing symptoms of hypothyroidism, despite compliance with the current treatment regimen, such as fatigue, weight gain, constipation, dry skin, cold intolerance, hair loss, or depression.  Lab Results  Component Value Date   TSH 2.890 08/25/2023      Mixed hyperlipidemia Assessment & Plan: The patient is currently taking lovastatin 40 mg daily and ezetimibe 10 mg daily. She was encouraged to follow a heart-healthy diet and increase physical activity as tolerated. No refills are needed at this time. A lipid panel is pending. Lab Results  Component Value Date   CHOL 132 08/25/2023   HDL 63 08/25/2023   LDLCALC 45 08/25/2023   TRIG 140 08/25/2023   CHOLHDL 2.1 08/25/2023  Orders: -     Ezetimibe; Take 1 tablet (10 mg total) by mouth daily. Take 10 mg by mouth daily.  Dispense: 90 tablet; Refill: 1 -     Lovastatin; Take 2 tablets (80 mg total) by mouth at bedtime.  Dispense: 90 tablet; Refill: 1  Vitamin D deficiency -     Vitamin D (Ergocalciferol); Take 1 capsule (50,000 Units total) by mouth every 7 (seven) days.  Dispense: 20 capsule; Refill: 1  Note:  This chart has been completed using Engineer, civil (consulting) software, and while attempts have been made to ensure accuracy, certain words and phrases may not be transcribed as intended.    Follow-up: Return in about 1 month (around 03/18/2024).   Gilmore Laroche, FNP

## 2024-02-16 NOTE — Patient Instructions (Addendum)
 I appreciate the opportunity to provide care to you today!    Follow up:  1 months Annual physical Exam  Labs: please stop by the lab 1 month to get your blood drawn (CBC, CMP, TSH, Lipid profile, HgA1c, Vit D)  Schedule medicare annual visit  Attached with your AVS, you will find valuable resources for self-education. I highly recommend dedicating some time to thoroughly examine them.   Please continue to a heart-healthy diet and increase your physical activities. Try to exercise for at least five days a week.    It was a pleasure to see you and I look forward to continuing to work together on your health and well-being. Please do not hesitate to call the office if you need care or have questions about your care.  In case of emergency, please visit the Emergency Department for urgent care, or contact our clinic at [phone] to schedule an appointment. We're here to help you!   Have a wonderful day and week. With Gratitude, Gilmore Laroche MSN, FNP-BC

## 2024-02-19 NOTE — Assessment & Plan Note (Signed)
 She takes Synthroid 75 mcg daily The patient is encouraged to follow up if experiencing symptoms of hypothyroidism, despite compliance with the current treatment regimen, such as fatigue, weight gain, constipation, dry skin, cold intolerance, hair loss, or depression.  Lab Results  Component Value Date   TSH 2.890 08/25/2023

## 2024-02-19 NOTE — Assessment & Plan Note (Signed)
 The patient is currently taking lovastatin 40 mg daily and ezetimibe 10 mg daily. She was encouraged to follow a heart-healthy diet and increase physical activity as tolerated. No refills are needed at this time. A lipid panel is pending. Lab Results  Component Value Date   CHOL 132 08/25/2023   HDL 63 08/25/2023   LDLCALC 45 08/25/2023   TRIG 140 08/25/2023   CHOLHDL 2.1 08/25/2023

## 2024-02-19 NOTE — Assessment & Plan Note (Signed)
 The patient's blood pressure is well-controlled on lisinopril 10 mg daily. She is also taking furosemide 40 mg daily for peripheral edema. The patient was advised to check her blood pressure before taking her medication and to hold her blood pressure medication if her reading is below 90/60 mmHg. She verbalized understanding of these instructions. Additionally, the patient was encouraged to follow a heart-healthy diet and increase physical activity BP Readings from Last 3 Encounters:  02/16/24 122/71  12/26/23 (!) 110/58  09/27/23 117/72

## 2024-02-27 ENCOUNTER — Ambulatory Visit: Payer: Medicare HMO | Admitting: Family Medicine

## 2024-03-06 ENCOUNTER — Other Ambulatory Visit: Payer: Self-pay | Admitting: Family Medicine

## 2024-04-18 ENCOUNTER — Telehealth: Payer: Self-pay | Admitting: Family Medicine

## 2024-04-18 NOTE — Telephone Encounter (Signed)
 Copied from CRM [id number]. Topic: General - Other >> Apr 18, 2024  9:34 AM Star [place] wrote: Reason for CRM: Patient has a physical tomorrow and needs to know if there will be fasting labs  [phone]

## 2024-04-18 NOTE — Telephone Encounter (Signed)
 Called pt back let her know last labs were in 09/2023, more than likely labs will be done advised her to fast at least 6 hours prior to visit. Verbalized understanding.

## 2024-04-19 ENCOUNTER — Ambulatory Visit (INDEPENDENT_AMBULATORY_CARE_PROVIDER_SITE_OTHER): Admitting: Family Medicine

## 2024-04-19 ENCOUNTER — Encounter: Payer: Self-pay | Admitting: Family Medicine

## 2024-04-19 VITALS — BP 136/77 | HR 86 | Ht 63.0 in | Wt 168.0 lb

## 2024-04-19 DIAGNOSIS — I10 Essential (primary) hypertension: Secondary | ICD-10-CM

## 2024-04-19 DIAGNOSIS — M48061 Spinal stenosis, lumbar region without neurogenic claudication: Secondary | ICD-10-CM | POA: Diagnosis not present

## 2024-04-19 DIAGNOSIS — E038 Other specified hypothyroidism: Secondary | ICD-10-CM | POA: Diagnosis not present

## 2024-04-19 DIAGNOSIS — E559 Vitamin D deficiency, unspecified: Secondary | ICD-10-CM

## 2024-04-19 DIAGNOSIS — R7301 Impaired fasting glucose: Secondary | ICD-10-CM | POA: Diagnosis not present

## 2024-04-19 DIAGNOSIS — E782 Mixed hyperlipidemia: Secondary | ICD-10-CM

## 2024-04-19 DIAGNOSIS — Z0001 Encounter for general adult medical examination with abnormal findings: Secondary | ICD-10-CM | POA: Diagnosis not present

## 2024-04-19 DIAGNOSIS — Z604 Social exclusion and rejection: Secondary | ICD-10-CM | POA: Diagnosis not present

## 2024-04-19 DIAGNOSIS — E7849 Other hyperlipidemia: Secondary | ICD-10-CM

## 2024-04-19 MED ORDER — SPIRONOLACTONE 25 MG PO TABS: 12.5000 mg | ORAL_TABLET | Freq: Every day | ORAL | 2 refills | Status: DC

## 2024-04-19 MED ORDER — GABAPENTIN 600 MG PO TABS: 600.0000 mg | ORAL_TABLET | Freq: Three times a day (TID) | ORAL | 3 refills | Status: AC

## 2024-04-19 MED ORDER — EZETIMIBE 10 MG PO TABS
10.0000 mg | ORAL_TABLET | Freq: Every day | ORAL | 1 refills | Status: DC
Start: 2024-04-19 — End: 2024-08-16

## 2024-04-19 NOTE — Progress Notes (Signed)
 Complete physical exam  Patient: Robin Malone   DOB: December 24, 1933   89 y.o. Female  MRN: [id number]  Subjective:     Chief Complaint  Patient presents with   Annual Exam    CPE Pt. Reports feeling depressed in the last several weeks and she feels lonely.  Having trouble sleeping w/ in the last 2 wks due to severe pain in lower rt. Back and hip.  Read eliquis  instructions incorrectly and has only been taking one a day instead of two. She states that if she cuts herself that she bleeds freely she is wanting advice on if she should start taking the two or stay at the one.     Robin Malone is a 89 y.o. female who presents today for a complete physical exam. She reports consuming a general diet. The patient does not participate in regular exercise at present. She generally feels well. She reports sleeping well. She does have additional problems to discuss today.    Most recent fall risk assessment:    04/19/2024   11:39 AM  Fall Risk   Falls in the past year? 0  Number falls in past yr: 0  Injury with Fall? 0     Most recent depression screenings:    04/19/2024   11:28 AM 09/27/2023    9:27 AM  PHQ 2/9 Scores  PHQ - 2 Score 2 0  PHQ- 9 Score 4 0    Dental: No current dental problems and No regular dental care      Patient Care Team: Kaydon Creedon, FNP as PCP - General (Family Medicine) Gerard Knight, MD as PCP - Cardiology (Cardiology)   Outpatient Medications Prior to Visit  Medication Sig   acetaminophen  (TYLENOL ) 650 MG CR tablet Take 650 mg by mouth 2 (two) times daily.   apixaban  (ELIQUIS ) 5 MG TABS tablet Take 1 tablet (5 mg total) by mouth 2 (two) times daily.   Cyanocobalamin  (VITAMIN B-12 PO) Take 1 tablet by mouth daily.   diclofenac  Sodium (VOLTAREN ) 1 % GEL Apply 4 g topically 4 (four) times daily as needed (knee pain).   empagliflozin  (JARDIANCE ) 10 MG TABS tablet Take 1 tablet (10 mg total) by mouth daily before breakfast.   fluticasone  (FLONASE )  50 MCG/ACT nasal spray Place 1 spray into both nostrils 2 (two) times daily.   furosemide  (LASIX ) 40 MG tablet Take 1 tablet (40 mg total) by mouth daily.   Glycerin-Hypromellose-PEG 400 (DRY EYE RELIEF DROPS OP) Place 1 drop into both eyes 2 (two) times daily as needed (dry eyes).   levothyroxine  (SYNTHROID ) 75 MCG tablet Take 1 tablet (75 mcg total) by mouth daily.   lisinopril  (ZESTRIL ) 10 MG tablet Take 1 tablet (10 mg total) by mouth daily.   lovastatin  (MEVACOR ) 40 MG tablet Take 2 tablets (80 mg total) by mouth at bedtime.   montelukast  (SINGULAIR ) 10 MG tablet Take 1 tablet (10 mg total) by mouth at bedtime as needed (allergies).   Multiple Vitamin (MULTIVITAMIN) tablet Take 1 tablet by mouth daily.   omeprazole  (PRILOSEC) 40 MG capsule TAKE 1 CAPSULE AT BEDTIME   Vitamin D , Ergocalciferol , (DRISDOL ) 1.25 MG (50000 UNIT) CAPS capsule Take 1 capsule (50,000 Units total) by mouth every 7 (seven) days.   [DISCONTINUED] ezetimibe  (ZETIA ) 10 MG tablet Take 1 tablet (10 mg total) by mouth daily. Take 10 mg by mouth daily.   [DISCONTINUED] gabapentin  (NEURONTIN ) 600 MG tablet TAKE 1 TABLET(600 MG) BY MOUTH THREE TIMES DAILY   [  DISCONTINUED] spironolactone  (ALDACTONE ) 25 MG tablet Take 0.5 tablets (12.5 mg total) by mouth daily.   Cholecalciferol (VITAMIN D -3 PO) Take 1 capsule by mouth daily. (Patient not taking: Reported on 04/19/2024)   HYDROcodone -acetaminophen  (NORCO/VICODIN) 5-325 MG tablet Take 1 tablet by mouth every 8 (eight) hours as needed for moderate pain. (Patient not taking: Reported on 04/19/2024)   No facility-administered medications prior to visit.    Review of Systems  Constitutional:  Negative for chills, fever and malaise/fatigue.  HENT:  Negative for congestion and sinus pain.   Eyes:  Negative for pain, discharge and redness.  Respiratory:  Negative for cough, sputum production and shortness of breath.   Cardiovascular:  Negative for chest pain, palpitations, claudication  and leg swelling.  Gastrointestinal:  Negative for diarrhea, heartburn and nausea.  Genitourinary:  Negative for flank pain and frequency.  Musculoskeletal:  Negative for back pain and joint pain.  Skin:  Negative for itching.  Neurological:  Negative for dizziness, seizures and headaches.  Endo/Heme/Allergies:  Negative for environmental allergies.  Psychiatric/Behavioral:  Negative for memory loss. The patient does not have insomnia.        Objective:    BP 136/77   Pulse 86   Ht 5\' 3"  (1.6 m)   Wt 168 lb 0.6 oz (76.2 kg)   SpO2 91%   BMI 29.77 kg/m     Physical Exam HENT:     Head: Normocephalic.     Left Ear: External ear normal.     Mouth/Throat:     Mouth: Mucous membranes are moist.  Eyes:     Extraocular Movements: Extraocular movements intact.     Pupils: Pupils are equal, round, and reactive to light.  Cardiovascular:     Rate and Rhythm: Normal rate and regular rhythm.     Heart sounds: No murmur heard. Pulmonary:     Effort: Pulmonary effort is normal.     Breath sounds: Normal breath sounds.  Abdominal:     Palpations: Abdomen is soft.     Tenderness: There is no right CVA tenderness or left CVA tenderness.  Musculoskeletal:     Right lower leg: No edema.     Left lower leg: No edema.  Skin:    Findings: No lesion or rash.  Neurological:     Mental Status: She is alert and oriented to person, place, and time.     GCS: GCS eye subscore is 4. GCS verbal subscore is 5. GCS motor subscore is 6.     Cranial Nerves: No facial asymmetry.     Sensory: No sensory deficit.     Motor: No weakness.     Coordination: Coordination normal. Finger-Nose-Finger Test normal.     Gait: Gait normal.  Psychiatric:        Judgment: Judgment normal.     No results found for any visits on 04/19/24.      Assessment & Plan:    Routine Health Maintenance and Physical Exam  Immunization History  Administered Date(s) Administered   Fluad Quad(high Dose 65+)  09/02/2023   Influenza Split 09/26/2012, 10/13/2015   Influenza, High Dose Seasonal PF 10/30/2016, 09/29/2017, 09/27/2018, 09/19/2020, 09/12/2021, 10/19/2022   Influenza-Unspecified 10/13/2015, 09/12/2017   Moderna Sars-Covid-2 Vaccination 02/07/2020, 03/06/2020, 10/10/2020   Pneumococcal Conjugate-13 12/13/2014, 12/30/2014   Pneumococcal Polysaccharide-23 12/13/2002   Td (Adult),5 Lf Tetanus Toxid, Preservative Free 06/30/2007   Tdap 02/08/2018   Zoster, Live 08/09/2013    Health Maintenance  Topic Date Due   Medicare Annual  Wellness (AWV)  Never done   COVID-19 Vaccine (4 - 2024-25 season) 05/05/2024 (Originally 08/14/2023)   Zoster Vaccines- Shingrix (1 of 2) 07/20/2024 (Originally 04/11/1953)   DEXA SCAN  08/23/2024 (Originally 04/10/2001)   INFLUENZA VACCINE  07/13/2024   DTaP/Tdap/Td (2 - Td or Tdap) 02/09/2028   Pneumonia Vaccine 30+ Years old  Completed   HPV VACCINES  Aged Out   Meningococcal B Vaccine  Aged Out    Discussed health benefits of physical activity, and encouraged her to engage in regular exercise appropriate for her age and condition.  Encounter for general adult medical examination with abnormal findings Assessment & Plan: Physical exam as documented Discussed heart-healthy diet  Encouraged to Exercise: If you are able: 30 -60 minutes a day ,4 days a week, or 150 minutes a week. The longer the better. Combine stretch, strength, and aerobic activities Encourage to eat whole Food, Plant Predominant Nutrition is highly recommended: Eat Plenty of vegetables, Mushrooms, fruits, Legumes, Whole Grains, Nuts, seeds in lieu of processed meats, processed snacks/pastries red meat, poultry, eggs.  Will f/u in 1 year for CPE      Social isolation -     Amb ref to [facility]  Spinal stenosis of lumbar region without neurogenic claudication -     Gabapentin ; Take 1 tablet (600 mg total) by mouth 3 (three) times daily.  Dispense: 90 tablet; Refill: 3 -      Ambulatory referral to Orthopedic Surgery  Primary hypertension -     Spironolactone ; Take 0.5 tablets (12.5 mg total) by mouth daily.  Dispense: 60 tablet; Refill: 2  IFG (impaired fasting glucose) -     Hemoglobin A1c  Vitamin D  deficiency -     VITAMIN D  25 Hydroxy (Vit-D Deficiency, Fractures)  TSH (thyroid-stimulating hormone deficiency) -     TSH + free T4  Other hyperlipidemia -     Lipid panel -     CMP14+EGFR -     CBC with Differential/Platelet  Mixed hyperlipidemia -     Ezetimibe ; Take 1 tablet (10 mg total) by mouth daily. Take 10 mg by mouth daily.  Dispense: 90 tablet; Refill: 1  Note: This chart has been completed using Engineer, civil (consulting) software, and while attempts have been made to ensure accuracy, certain words and phrases may not be transcribed as intended.    No follow-ups on file.     Rease Wence, FNP

## 2024-04-19 NOTE — Patient Instructions (Addendum)
 I appreciate the opportunity to provide care to you today!    Follow up:  4  Fasting Labs: please stop by the during the week today to get your blood drawn (CBC, CMP, TSH, Lipid profile, HgA1c, Vit D)  Referrals today-  [facility], behavioral health  Attached with your AVS, you will find valuable resources for self-education. I highly recommend dedicating some time to thoroughly examine them.   Please continue to a heart-healthy diet and increase your physical activities. Try to exercise for at least five days a week.    It was a pleasure to see you and I look forward to continuing to work together on your health and well-being. Please do not hesitate to call the office if you need care or have questions about your care.  In case of emergency, please visit the Emergency Department for urgent care, or contact our clinic at [phone] to schedule an appointment. We're here to help you!   Have a wonderful day and week. With Gratitude, Macedonio Scallon MSN, FNP-BC

## 2024-04-24 NOTE — Assessment & Plan Note (Signed)

## 2024-04-27 ENCOUNTER — Other Ambulatory Visit: Payer: Self-pay | Admitting: Family Medicine

## 2024-04-27 DIAGNOSIS — E782 Mixed hyperlipidemia: Secondary | ICD-10-CM

## 2024-05-14 DIAGNOSIS — R7301 Impaired fasting glucose: Secondary | ICD-10-CM | POA: Diagnosis not present

## 2024-05-14 DIAGNOSIS — E559 Vitamin D deficiency, unspecified: Secondary | ICD-10-CM | POA: Diagnosis not present

## 2024-05-14 DIAGNOSIS — E7849 Other hyperlipidemia: Secondary | ICD-10-CM | POA: Diagnosis not present

## 2024-05-14 DIAGNOSIS — E038 Other specified hypothyroidism: Secondary | ICD-10-CM | POA: Diagnosis not present

## 2024-05-15 LAB — TSH+FREE T4
Free T4: 1.1 ng/dL (ref 0.82–1.77)
TSH: 2.79 u[IU]/mL (ref 0.450–4.500)

## 2024-05-15 LAB — CMP14+EGFR
ALT: 13 IU/L (ref 0–32)
AST: 21 IU/L (ref 0–40)
Albumin: 4.4 g/dL (ref 3.7–4.7)
Alkaline Phosphatase: 67 IU/L (ref 44–121)
BUN/Creatinine Ratio: 19 (ref 12–28)
BUN: 18 mg/dL (ref 8–27)
Bilirubin Total: 0.3 mg/dL (ref 0.0–1.2)
CO2: 22 mmol/L (ref 20–29)
Calcium: 9.7 mg/dL (ref 8.7–10.3)
Chloride: 104 mmol/L (ref 96–106)
Creatinine, Ser: 0.93 mg/dL (ref 0.57–1.00)
Globulin, Total: 2.6 g/dL (ref 1.5–4.5)
Glucose: 90 mg/dL (ref 70–99)
Potassium: 4.7 mmol/L (ref 3.5–5.2)
Sodium: 141 mmol/L (ref 134–144)
Total Protein: 7 g/dL (ref 6.0–8.5)
eGFR: 59 mL/min/{1.73_m2} — ABNORMAL LOW (ref >59)

## 2024-05-15 LAB — CBC WITH DIFFERENTIAL/PLATELET
Basophils Absolute: 0 10*3/uL (ref 0.0–0.2)
Basos: 1 %
EOS (ABSOLUTE): 0.2 10*3/uL (ref 0.0–0.4)
Eos: 3 %
Hematocrit: 37.7 % (ref 34.0–46.6)
Hemoglobin: 12.5 g/dL (ref 11.1–15.9)
Immature Grans (Abs): 0 10*3/uL (ref 0.0–0.1)
Immature Granulocytes: 0 %
Lymphocytes Absolute: 3.2 10*3/uL — ABNORMAL HIGH (ref 0.7–3.1)
Lymphs: 42 %
MCH: 35.5 pg — ABNORMAL HIGH (ref 26.6–33.0)
MCHC: 33.2 g/dL (ref 31.5–35.7)
MCV: 107 fL — ABNORMAL HIGH (ref 79–97)
Monocytes Absolute: 0.6 10*3/uL (ref 0.1–0.9)
Monocytes: 8 %
Neutrophils Absolute: 3.4 10*3/uL (ref 1.4–7.0)
Neutrophils: 46 %
Platelets: 222 10*3/uL (ref 150–450)
RBC: 3.52 x10E6/uL — ABNORMAL LOW (ref 3.77–5.28)
RDW: 13.8 % (ref 11.7–15.4)
WBC: 7.5 10*3/uL (ref 3.4–10.8)

## 2024-05-15 LAB — VITAMIN D 25 HYDROXY (VIT D DEFICIENCY, FRACTURES): Vit D, 25-Hydroxy: 109 ng/mL — ABNORMAL HIGH (ref 30.0–100.0)

## 2024-05-15 LAB — LIPID PANEL
Chol/HDL Ratio: 1.9 ratio (ref 0.0–4.4)
Cholesterol, Total: 154 mg/dL (ref 100–199)
HDL: 82 mg/dL (ref >39)
LDL Chol Calc (NIH): 55 mg/dL (ref 0–99)
Triglycerides: 96 mg/dL (ref 0–149)
VLDL Cholesterol Cal: 17 mg/dL (ref 5–40)

## 2024-05-15 LAB — HEMOGLOBIN A1C
Est. average glucose Bld gHb Est-mCnc: 108 mg/dL
Hgb A1c MFr Bld: 5.4 % (ref 4.8–5.6)

## 2024-05-17 ENCOUNTER — Ambulatory Visit: Admitting: Sports Medicine

## 2024-05-22 ENCOUNTER — Ambulatory Visit (INDEPENDENT_AMBULATORY_CARE_PROVIDER_SITE_OTHER): Admitting: Sports Medicine

## 2024-05-22 ENCOUNTER — Other Ambulatory Visit (INDEPENDENT_AMBULATORY_CARE_PROVIDER_SITE_OTHER)

## 2024-05-22 DIAGNOSIS — M7061 Trochanteric bursitis, right hip: Secondary | ICD-10-CM

## 2024-05-22 MED ORDER — TRIAMCINOLONE ACETONIDE 40 MG/ML IJ SUSP
40.0000 mg | Freq: Once | INTRAMUSCULAR | Status: AC
Start: 2024-05-22 — End: 2024-05-22
  Administered 2024-05-22: 40 mg via INTRAMUSCULAR

## 2024-05-22 NOTE — Assessment & Plan Note (Signed)
 Recurrence of right hip pain, lateral aspect, localized over the greater trochanter consistent with trochanteric bursitis/hip abductor tendinopathy, this was last injected in August 2021. We will do a repeat injection today, we always take care to avoid intratendinous injection. We will add aggressive hip abductor physical therapy, return in 6 weeks as needed.

## 2024-05-22 NOTE — Progress Notes (Signed)
    Procedures performed today:    Procedure: Real-time Ultrasound Guided injection of the right hip greater trochanteric bursitis Device: Samsung HS60  Verbal informed consent obtained.  Time-out conducted.  Noted no overlying erythema, induration, or other signs of local infection.  Skin prepped in a sterile fashion.  Local anesthesia: Topical Ethyl chloride.  With sterile technique and under real time ultrasound guidance: 1 cc Kenalog  40, 2 cc lidocaine , 2 cc bupivacaine injected easily Completed without difficulty  Advised to call if fevers/chills, erythema, induration, drainage, or persistent bleeding.  Images permanently stored and available for review in PACS.  Impression: Technically successful ultrasound guided injection.  Independent interpretation of notes and tests performed by another provider:   None.  Brief History, Exam, Impression, and Recommendations:    Greater trochanteric bursitis of right hip Recurrence of right hip pain, lateral aspect, localized over the greater trochanter consistent with trochanteric bursitis/hip abductor tendinopathy, this was last injected in August 2021. We will do a repeat injection today, we always take care to avoid intratendinous injection. We will add aggressive hip abductor physical therapy, return in 6 weeks as needed.    ____________________________________________ Joselyn Nicely. Sandy Crumb, M.D., ABFM., CAQSM., AME. Primary Care and Sports Medicine [facility]  Adjunct Professor of [facility] Medicine  [employer]  Restaurant manager, fast food

## 2024-05-28 ENCOUNTER — Ambulatory Visit: Payer: Self-pay | Admitting: Family Medicine

## 2024-05-31 ENCOUNTER — Ambulatory Visit (INDEPENDENT_AMBULATORY_CARE_PROVIDER_SITE_OTHER): Admitting: Family Medicine

## 2024-05-31 ENCOUNTER — Encounter: Payer: Self-pay | Admitting: Family Medicine

## 2024-05-31 VITALS — BP 121/67 | HR 78 | Resp 16 | Ht 63.0 in | Wt 168.0 lb

## 2024-05-31 DIAGNOSIS — N3941 Urge incontinence: Secondary | ICD-10-CM | POA: Diagnosis not present

## 2024-05-31 MED ORDER — MIRABEGRON ER 25 MG PO TB24: 25.0000 mg | ORAL_TABLET | Freq: Every day | ORAL | 0 refills | Status: DC

## 2024-05-31 NOTE — Patient Instructions (Addendum)
 I appreciate the opportunity to provide care to you today!    Follow up:  1 months  Urinary Incontinence: -Please start taking Myrbetriq 25 mg once daily to help manage symptoms of overactive bladder and urinary urgency.  Nonpharmacological Interventions: Pelvic Floor Muscle Training (Kegel Exercises): Strengthens the muscles that support the bladder and urethra. Most effective when performed consistently. May benefit from guidance by a pelvic floor physical therapist.  Bladder Training: Gradually increase the time between urination to improve bladder control and reduce urgency.  Timed Voiding: Use the restroom on a regular schedule (e.g., every 2-3 hours), even without the urge to urinate, to prevent accidents.  Limit bladder irritants such as caffeine, alcohol , carbonated beverages, and spicy or acidic foods.  Manage fluid intake to avoid excessive consumption before bedtime or physical activity.  Please follow up if your symptoms worsen or fail to improve.  Attached with your AVS, you will find valuable resources for self-education. I highly recommend dedicating some time to thoroughly examine them.   Please continue to a heart-healthy diet and increase your physical activities. Try to exercise for at least five days a week.    It was a pleasure to see you and I look forward to continuing to work together on your health and well-being. Please do not hesitate to call the office if you need care or have questions about your care.  In case of emergency, please visit the Emergency Department for urgent care, or contact our clinic at [phone] to schedule an appointment. We're here to help you!   Have a wonderful day and week. With Gratitude, Jkai Arwood MSN, FNP-BC

## 2024-05-31 NOTE — Progress Notes (Unsigned)
 Established Patient Office Visit  Subjective:  Patient ID: Robin Malone, female    DOB: [birth year]  Age: 89 y.o. MRN: [id number]  CC:  Chief Complaint  Patient presents with   Urinary Incontinence    X 2 weeks, has been urinating more frequently and if she waits when she feels the urge, she has an accident     HPI Robin Malone is a 89 y.o. female with past medical history of *** presents for f/u of *** chronic medical conditions.  Trace of leukocystes  Past Medical History:  Diagnosis Date   Arthritis    Years ago   Atrial fibrillation (HCC)    Chronic back pain    Depression 03/30/2024   Being alone   GERD (gastroesophageal reflux disease)    Hypertension    Hypothyroidism    Mixed hyperlipidemia     Past Surgical History:  Procedure Laterality Date   ABDOMINAL HYSTERECTOMY     Years ago   BACK SURGERY     Capsulotomy MPJ Right 10/01/2016   RT #2 Toe   Hammer Toe Repair Right 10/01/2016   RT #2 Toe   IR RADIOLOGIST EVAL & MGMT  08/24/2023   SPINE SURGERY     Years ago    Family History  Problem Relation Age of Onset   Arthritis Mother    Hypertension Father    Asthma Sister    Hypertension Sister     Social History   Socioeconomic History   Marital status: Widowed    Spouse name: Not on file   Number of children: Not on file   Years of education: Not on file   Highest education level: Some college, no degree  Occupational History   Not on file  Tobacco Use   Smoking status: Former    Current packs/day: 0.00    Types: Cigarettes    Start date: 41    Quit date: 04/10/1996    Years since quitting: 28.1   Smokeless tobacco: Never  Vaping Use   Vaping status: Never Used  Substance and Sexual Activity   Alcohol  use: Yes    Alcohol /week: 7.0 standard drinks of alcohol     Types: 7 Glasses of wine per week   Drug use: Never   Sexual activity: Not Currently  Other Topics Concern   Not on file  Social History Narrative   Not on file    Social Drivers of Health   Financial Resource Strain: Low Risk  (05/28/2024)   Overall Financial Resource Strain (CARDIA)    Difficulty of Paying Living Expenses: Not hard at all  Food Insecurity: No Food Insecurity (05/28/2024)   Hunger Vital Sign    Worried About Running Out of Food in the Last Year: Never true    Ran Out of Food in the Last Year: Never true  Transportation Needs: No Transportation Needs (05/28/2024)   PRAPARE - Administrator, Civil Service (Medical): No    Lack of Transportation (Non-Medical): No  Physical Activity: Inactive (05/28/2024)   Exercise Vital Sign    Days of Exercise per Week: 0 days    Minutes of Exercise per Session: Not on file  Stress: No Stress Concern Present (05/28/2024)   [employer] of Occupational Health - Occupational Stress Questionnaire    Feeling of Stress: Only a little  Social Connections: Socially Isolated (05/28/2024)   Social Connection and Isolation Panel    Frequency of Communication with Friends and Family: Once a week  Frequency of Social Gatherings with Friends and Family: Once a week    Attends Religious Services: Patient declined    Active Member of Clubs or Organizations: No    Attends Engineer, structural: Not on file    Marital Status: Widowed  Intimate Partner Violence: Not At Risk (09/11/2023)   Humiliation, Afraid, Rape, and Kick questionnaire    Fear of Current or Ex-Partner: No    Emotionally Abused: No    Physically Abused: No    Sexually Abused: No    Outpatient Medications Prior to Visit  Medication Sig Dispense Refill   acetaminophen  (TYLENOL ) 650 MG CR tablet Take 650 mg by mouth 2 (two) times daily.     apixaban  (ELIQUIS ) 5 MG TABS tablet Take 1 tablet (5 mg total) by mouth 2 (two) times daily. 180 tablet 1   Cholecalciferol (VITAMIN D -3 PO) Take 1 capsule by mouth daily.     Cyanocobalamin  (VITAMIN B-12 PO) Take 1 tablet by mouth daily.     diclofenac  Sodium (VOLTAREN ) 1 % GEL  Apply 4 g topically 4 (four) times daily as needed (knee pain).     empagliflozin  (JARDIANCE ) 10 MG TABS tablet Take 1 tablet (10 mg total) by mouth daily before breakfast. 90 tablet 3   ezetimibe  (ZETIA ) 10 MG tablet Take 1 tablet (10 mg total) by mouth daily. Take 10 mg by mouth daily. 90 tablet 1   fluticasone  (FLONASE ) 50 MCG/ACT nasal spray Place 1 spray into both nostrils 2 (two) times daily.     furosemide  (LASIX ) 40 MG tablet Take 1 tablet (40 mg total) by mouth daily.     gabapentin  (NEURONTIN ) 600 MG tablet Take 1 tablet (600 mg total) by mouth 3 (three) times daily. 90 tablet 3   Glycerin-Hypromellose-PEG 400 (DRY EYE RELIEF DROPS OP) Place 1 drop into both eyes 2 (two) times daily as needed (dry eyes).     levothyroxine  (SYNTHROID ) 75 MCG tablet Take 1 tablet (75 mcg total) by mouth daily.     lisinopril  (ZESTRIL ) 10 MG tablet Take 1 tablet (10 mg total) by mouth daily. 90 tablet 1   lovastatin  (MEVACOR ) 40 MG tablet TAKE 2 TABLETS AT BEDTIME 180 tablet 3   montelukast  (SINGULAIR ) 10 MG tablet Take 1 tablet (10 mg total) by mouth at bedtime as needed (allergies). 30 tablet 0   Multiple Vitamin (MULTIVITAMIN) tablet Take 1 tablet by mouth daily.     omeprazole  (PRILOSEC) 40 MG capsule TAKE 1 CAPSULE AT BEDTIME 30 capsule 11   spironolactone  (ALDACTONE ) 25 MG tablet Take 0.5 tablets (12.5 mg total) by mouth daily. 60 tablet 2   Vitamin D , Ergocalciferol , (DRISDOL ) 1.25 MG (50000 UNIT) CAPS capsule Take 1 capsule (50,000 Units total) by mouth every 7 (seven) days. 20 capsule 1   HYDROcodone -acetaminophen  (NORCO/VICODIN) 5-325 MG tablet Take 1 tablet by mouth every 8 (eight) hours as needed for moderate pain. (Patient not taking: Reported on 05/31/2024) 90 tablet 0   No facility-administered medications prior to visit.    Allergies  Allergen Reactions   Codeine Nausea And Vomiting   Levaquin [Levofloxacin] Other (See Comments)    Arthralgias    Norco [Hydrocodone -Acetaminophen ] Nausea  And Vomiting   Ultram  [Tramadol ] Nausea And Vomiting   Vibra-Tab [Doxycycline] Rash    ROS Review of Systems    Objective:    Physical Exam  BP 121/67   Pulse 78   Resp 16   Ht 5' 3 (1.6 m)   Wt 168 lb (76.2  kg)   SpO2 92%   BMI 29.76 kg/m  Wt Readings from Last 3 Encounters:  05/31/24 168 lb (76.2 kg)  04/19/24 168 lb 0.6 oz (76.2 kg)  02/16/24 164 lb (74.4 kg)    Lab Results  Component Value Date   TSH 2.790 05/14/2024   Lab Results  Component Value Date   WBC 7.5 05/14/2024   HGB 12.5 05/14/2024   HCT 37.7 05/14/2024   MCV 107 (H) 05/14/2024   PLT 222 05/14/2024   Lab Results  Component Value Date   NA 141 05/14/2024   K 4.7 05/14/2024   CO2 22 05/14/2024   GLUCOSE 90 05/14/2024   BUN 18 05/14/2024   CREATININE 0.93 05/14/2024   BILITOT 0.3 05/14/2024   ALKPHOS 67 05/14/2024   AST 21 05/14/2024   ALT 13 05/14/2024   PROT 7.0 05/14/2024   ALBUMIN 4.4 05/14/2024   CALCIUM 9.7 05/14/2024   ANIONGAP 9 12/26/2023   EGFR 59 (L) 05/14/2024   Lab Results  Component Value Date   CHOL 154 05/14/2024   Lab Results  Component Value Date   HDL 82 05/14/2024   Lab Results  Component Value Date   LDLCALC 55 05/14/2024   Lab Results  Component Value Date   TRIG 96 05/14/2024   Lab Results  Component Value Date   CHOLHDL 1.9 05/14/2024   Lab Results  Component Value Date   HGBA1C 5.4 05/14/2024      Assessment & Plan:  There are no diagnoses linked to this encounter.  Follow-up: No follow-ups on file.   Cardell Rachel, FNP

## 2024-06-04 DIAGNOSIS — R32 Unspecified urinary incontinence: Secondary | ICD-10-CM | POA: Insufficient documentation

## 2024-06-04 DIAGNOSIS — N3941 Urge incontinence: Secondary | ICD-10-CM | POA: Insufficient documentation

## 2024-06-04 LAB — URINALYSIS
Bilirubin, UA: NEGATIVE
Ketones, UA: NEGATIVE
Nitrite, UA: NEGATIVE
Protein,UA: NEGATIVE
Specific Gravity, UA: 1.015 (ref 1.005–1.030)
Urobilinogen, Ur: 0.2 mg/dL (ref 0.2–1.0)
pH, UA: 6 (ref 5.0–7.5)

## 2024-06-04 NOTE — Assessment & Plan Note (Signed)
 Urinalysis shows trace leukocytes. Findings are suggestive of asymptomatic bacteriuria; urine culture is pending for confirmation. Initiating Myrbetriq  25 mg once daily to manage symptoms of overactive bladder and urinary urgency.

## 2024-06-09 ENCOUNTER — Telehealth: Payer: Self-pay | Admitting: Pharmacist

## 2024-06-09 NOTE — Progress Notes (Signed)
 Received an email from Physician Liasons that this patient submitted the below request.    From: A website question has been submitted. [email or web address]> Sent: Saturday, June 09, 2024 10:02 AM To: Physician Liaisons @[email or web address]> Subject: Physician Liaison Website Question  You have a received an inquiry from the [facility] website.  Name: Robin Malone  Title: mrs  Practice Name: armida Ly: Ileana  Phone: [phone]  Email: [email or web address]  Question: on june 19th i had blood work done and results show that i have abnormal urinary issues. Since it has not been treated with any meds i am now seeing blood and burning when I uranate. Is it possible for a prescription to be sent to [facility] [address] in [place].  Contacted on call line for [facility] and spoke with RN, Patty. She attempted to call the patient and will try again later today. I will also send a MyChart message to the patient with the phone number for Patty.   Catie [facility], PharmD, [facility] Clinical Pharmacist [phone]

## 2024-06-11 ENCOUNTER — Ambulatory Visit: Payer: Self-pay

## 2024-06-11 ENCOUNTER — Other Ambulatory Visit: Payer: Self-pay | Admitting: Family Medicine

## 2024-06-11 DIAGNOSIS — N3000 Acute cystitis without hematuria: Secondary | ICD-10-CM

## 2024-06-11 DIAGNOSIS — N3941 Urge incontinence: Secondary | ICD-10-CM

## 2024-06-11 MED ORDER — GEMTESA 75 MG PO TABS: 75.0000 mg | ORAL_TABLET | Freq: Every day | ORAL | 1 refills | Status: AC

## 2024-06-11 MED ORDER — SULFAMETHOXAZOLE-TRIMETHOPRIM 800-160 MG PO TABS
1.0000 | ORAL_TABLET | Freq: Two times a day (BID) | ORAL | 0 refills | Status: AC
Start: 2024-06-11 — End: 2024-06-16

## 2024-06-11 NOTE — Telephone Encounter (Signed)
 Please inform the patient that a prescription for Bactrim has been sent to their pharmacy to be taken twice daily for five days. Additionally, a new prescription for Gemtesa 75 mg daily has been called in for the treatment of overactive bladder.

## 2024-06-11 NOTE — Telephone Encounter (Signed)
 FYI Only or Action Required?: Action required by provider: new medication.  Patient was last seen in primary care on 05/31/2024 by Zarwolo, Gloria, FNP. Called Nurse Triage reporting Urinary Frequency. Symptoms began 05/31/2024. Interventions attempted: Nothing. Symptoms are: gradually worsening.  Triage Disposition: Call PCP Within 24 Hours  Patient/caregiver understands and will follow disposition?: No, wishes to speak with PCP  Copied from CRM [id number]. Topic: Clinical - Red Word Triage >> Jun 11, 2024  8:31 AM Robin Malone wrote: [facility] that prompted transfer to Nurse Triage: uti? Burning, pressure, and pain when she starts to urinate.. Reason for Disposition  [1] Painful urination AND [2] EITHER frequency or urgency AND [3] has on-call doctor    Routing to PCP per patient to request Rx for a generic Abx  Answer Assessment - Initial Assessment Questions 1. SEVERITY: How bad is the pain?  (e.Malone., Scale 1-10; mild, moderate, or severe)   - MILD (1-3): complains slightly about urination hurting   - MODERATE (4-7): interferes with normal activities     - SEVERE (8-10): excruciating, unwilling or unable to urinate because of the pain      Moderate  2. FREQUENCY: How many times have you had painful urination today?      Each urination 3. PATTERN: Is pain present every time you urinate or just sometimes?      constant 4. ONSET: When did the painful urination start?      05/31/2024 5. FEVER: Do you have a fever? If Yes, ask: What is your temperature, how was it measured, and when did it start?     no 6. PAST UTI: Have you had a urine infection before? If Yes, ask: When was the last time? and What happened that time?      Yes - Abx 7. CAUSE: What do you think is causing the painful urination?  (e.Malone., UTI, scratch, Herpes sore)     uti 8. OTHER SYMPTOMS: Do you have any other symptoms? (e.Malone., blood in urine, flank pain, genital sores, urgency, vaginal discharge)     Abd  pain, low back pain, increase frequency & urination 9. PREGNANCY: Is there any chance you are pregnant? When was your last menstrual period?     N/a  Pt was seen in office on 05/31/2024 & prescribed ABX - pt did not pick up the ABX due to medication was too expensive.  Pt s/s have gotten worse: pt would like to request a new Rx be sent to her pharmacy for a generic Abx so her insurance will cover the medication.  Protocols used: Urination Pain - Female-A-AH

## 2024-07-04 ENCOUNTER — Ambulatory Visit: Payer: Self-pay

## 2024-07-04 NOTE — Telephone Encounter (Signed)
 Called pt to inform of scheduling conflict for upcoming 07/2024 appt: r/t TOC & f/u.  Pt stated the only reason she was considering TOC was due to Turner not working everyday:  pt stated no transfer of care at present.  Pt stated she would like to keep appt with Antonetta in August: this appt works for patient for scheduling purposes: pt informed that if later she would like to [facility] let the office know during office visit or call us  back and  we can assist via phone.  Pt verbalized understanding.

## 2024-07-13 ENCOUNTER — Telehealth: Payer: Self-pay | Admitting: Family Medicine

## 2024-07-13 ENCOUNTER — Ambulatory Visit (INDEPENDENT_AMBULATORY_CARE_PROVIDER_SITE_OTHER): Payer: Self-pay | Admitting: Family Medicine

## 2024-07-13 ENCOUNTER — Encounter: Payer: Self-pay | Admitting: Family Medicine

## 2024-07-13 VITALS — BP 111/66 | HR 88 | Ht 63.0 in | Wt 163.5 lb

## 2024-07-13 DIAGNOSIS — I10 Essential (primary) hypertension: Secondary | ICD-10-CM

## 2024-07-13 DIAGNOSIS — Z5986 Financial insecurity: Secondary | ICD-10-CM

## 2024-07-13 DIAGNOSIS — R351 Nocturia: Secondary | ICD-10-CM

## 2024-07-13 DIAGNOSIS — I4891 Unspecified atrial fibrillation: Secondary | ICD-10-CM | POA: Diagnosis not present

## 2024-07-13 DIAGNOSIS — E038 Other specified hypothyroidism: Secondary | ICD-10-CM

## 2024-07-13 NOTE — Assessment & Plan Note (Addendum)
 Cost of 2 meds eliquis  and farxiga   IS OVER $300 refer to pharmacy for eval with help

## 2024-07-13 NOTE — Telephone Encounter (Signed)
 Patient is wanting to switch providers from [facility] to [facility]. Please advise on the switch Thank you

## 2024-07-13 NOTE — Patient Instructions (Addendum)
 Please schedule annual exam in office requests new Provider, PLEASE FOLLOW PROTOCOL FOR  SWITCHING FROM GLORIA TO LAURA,,  Please schedule wellness exam in office per pt request as soon as possible   Pls send urine for c/S only  Nurse pls refer to [facility]  pharmacy to see if cost of eliquis  and Jardiance  can be addressed AS YOU CURRENTLY SPEND OVER $300 PER MONTH ON BOTH  Nurse pls give pt the good rx and other saving card to take to her opharmacy to see if cost can be reduced  Thanks for choosing [facility], we consider it a privelige to serve you.

## 2024-07-15 ENCOUNTER — Encounter: Payer: Self-pay | Admitting: Family Medicine

## 2024-07-15 ENCOUNTER — Ambulatory Visit: Payer: Self-pay | Admitting: Family Medicine

## 2024-07-15 LAB — URINE CULTURE

## 2024-07-15 NOTE — Assessment & Plan Note (Signed)
 Controlled, no change in medication

## 2024-07-15 NOTE — Progress Notes (Signed)
   Robin Malone     MRN: [id number]      DOB: 1934/08/03  No chief complaint on file.   HPI Robin Malone is here with several concerns.  C/o urinary frequency esp at night, no fever , chills or flank pain Record review shows abn CCUA in June, culture ordered but no report recovered so will send for c/s only since some symptoms persist. C/o high cost of 2 meds over $300 / month, eliquis  and jardiance , will request pharmacy review for help Requests in person wellness visit which has not been done since she established with the practice Requests annual exam which will involve entire hands on exam per her request/ preference Requests  Provider switch as feels current PCP is  not accessible  ROS Denies recent fever or chills. Denies sinus pressure, nasal congestion, ear pain or sore throat. Denies chest congestion, productive cough or wheezing. Denies chest pains, palpitations and leg swelling Denies abdominal pain, nausea, vomiting,diarrhea or constipation.   Chronic joint pain,  and limitation in mobility.Uses a cane, and holds on to walls in her home, denies any falls . Denies depression, anxiety   PE  BP 111/66   Pulse 88   Ht 5' 3 (1.6 m)   Wt 163 lb 8 oz (74.2 kg)   SpO2 93%   BMI 28.96 kg/m   Patient alert and oriented and in no cardiopulmonary distress.  HEENT: No facial asymmetry, EOMI,     Neck decreased though adequate ROM .  Chest: Clear to auscultation bilaterally.  CVS: S1, S2 no murmurs, no S3.Regular rate.     Ext: No edema  MS: Decreased ROM spine, hips  and knees.  Skin: Intact, no ulcerations or rash noted.  Psych: Good eye contact, normal affect.  not anxious or depressed appearing.  CNS: CN 2-12 intact, .no focal deficits noted.   Assessment & Plan Financial insecurity due to medical expenses Cost of 2 meds eliquis  and farxiga   IS OVER $300 refer to pharmacy for eval with help  Nocturia Freequency 3 and more at night, check urine culture  as recently had abn CCUA Advised take in most fluid prior to 7 pm  Atrial fibrillation with RVR (HCC) Maintained on eliquis , rate controlled  HTN (hypertension) Controlled, no change in medication   Hypothyroidism Controlled, no change in medication

## 2024-07-15 NOTE — Assessment & Plan Note (Signed)
 Freequency 3 and more at night, check urine culture as recently had abn CCUA Advised take in most fluid prior to 7 pm

## 2024-07-15 NOTE — Assessment & Plan Note (Signed)
 Maintained on eliquis , rate controlled

## 2024-07-16 ENCOUNTER — Ambulatory Visit: Admitting: Family Medicine

## 2024-07-16 ENCOUNTER — Telehealth: Payer: Self-pay

## 2024-07-16 NOTE — Progress Notes (Signed)
 Care Guide Pharmacy Note  07/16/2024 Name: Robin Malone MRN: [id number] DOB: 11/13/1934  Referred By: Zarwolo, Gloria, FNP Reason for referral: Complex Care Management (Outreach to schedule with Pharm d )   Robin Malone is a 89 y.o. year old female who is a primary care patient of Zarwolo, Gloria, FNP.  Robin Malone was referred to the pharmacist for assistance related to: Atrial Fibrillation  Successful contact was made with the patient to discuss pharmacy services including being ready for the pharmacist to call at least 5 minutes before the scheduled appointment time and to have medication bottles and any blood pressure readings ready for review. The patient agreed to meet with the pharmacist via telephone visit on (date/time).07/19/2024  Jeoffrey Buffalo , RMA     [place]  [facility], [facility] Guide  Direct Dial: [phone]  Website: [email or web address]

## 2024-07-16 NOTE — Telephone Encounter (Signed)
**Note De-identified  [place] Obfuscation** Please advise 

## 2024-07-16 NOTE — Telephone Encounter (Signed)
 yes

## 2024-07-17 NOTE — Telephone Encounter (Signed)
 Scheduled

## 2024-07-19 ENCOUNTER — Telehealth: Payer: Self-pay

## 2024-07-19 ENCOUNTER — Other Ambulatory Visit: Payer: Self-pay

## 2024-07-19 NOTE — Progress Notes (Signed)
   07/19/2024 Name: Robin Malone MRN: [id number] DOB: [birth year]  Chief Complaint  Patient presents with   Medication Assistance    07/19/24 3p telephone appt. Attempted to contact patient for scheduled appointment for medication management. Left HIPAA compliant message for patient to return my call at their convenience.    Lang Sieve, PharmD, BCGP Clinical Pharmacist  [phone]

## 2024-07-23 ENCOUNTER — Telehealth: Payer: Self-pay

## 2024-07-23 NOTE — Progress Notes (Signed)
 Complex Care Management Care Guide Note  07/23/2024 Name: Robin Malone MRN: [id number] DOB: Jul 22, 1934  Robin Malone is a 89 y.o. year old female who is a primary care patient of Zarwolo, Gloria, FNP and is actively engaged with the care management team. I reached out to [facility] by phone today to assist with re-scheduling  with the Pharmacist.  Follow up plan: Unsuccessful telephone outreach attempt made. A HIPAA compliant phone message was left for the patient providing contact information and requesting a return call.  Jeoffrey Buffalo , RMA     [facility]  [facility], [facility] Guide  Direct Dial: [phone]  Website: [email or web address]

## 2024-07-23 NOTE — Progress Notes (Unsigned)
 Complex Care Management Care Guide Note  07/23/2024 Name: Robin Malone MRN: [id number] DOB: 10/08/1934  Robin Malone is a 89 y.o. year old female who is a primary care patient of Zarwolo, Gloria, FNP and is actively engaged with the care management team. I reached out to [facility] by phone today to assist with re-scheduling  with the Pharmacist.  Follow up plan: Unsuccessful telephone outreach attempt made. A HIPAA compliant phone message was left for the patient providing contact information and requesting a return call.  Jeoffrey Buffalo , RMA     [facility]  [facility], [facility] Guide  Direct Dial: [phone]  Website: [email or web address]

## 2024-07-25 ENCOUNTER — Ambulatory Visit: Payer: Self-pay

## 2024-07-26 NOTE — Progress Notes (Signed)
 Complex Care Management Care Guide Note  07/26/2024 Name: Robin Malone MRN: [id number] DOB: 1934/04/03  Robin Malone is a 89 y.o. year old female who is a primary care patient of Zarwolo, Gloria, FNP and is actively engaged with the care management team. I reached out to [employer] by phone today to assist with re-scheduling  with the Pharmacist.  Follow up plan: Patient Declined rescheduling   Jeoffrey Buffalo , RMA     [facility]  [facility], [facility] Guide  Direct Dial: [phone]  Website: [email or web address]

## 2024-08-14 ENCOUNTER — Encounter: Payer: Self-pay | Admitting: Sports Medicine

## 2024-08-15 ENCOUNTER — Other Ambulatory Visit: Payer: Self-pay | Admitting: Family Medicine

## 2024-08-15 DIAGNOSIS — E782 Mixed hyperlipidemia: Secondary | ICD-10-CM

## 2024-08-31 ENCOUNTER — Ambulatory Visit

## 2024-08-31 VITALS — BP 118/66 | HR 87 | Resp 18 | Ht 64.0 in | Wt 165.1 lb

## 2024-08-31 DIAGNOSIS — S81801A Unspecified open wound, right lower leg, initial encounter: Secondary | ICD-10-CM | POA: Diagnosis not present

## 2024-08-31 DIAGNOSIS — Z7984 Long term (current) use of oral hypoglycemic drugs: Secondary | ICD-10-CM

## 2024-08-31 DIAGNOSIS — E0849 Diabetes mellitus due to underlying condition with other diabetic neurological complication: Secondary | ICD-10-CM

## 2024-08-31 DIAGNOSIS — Z23 Encounter for immunization: Secondary | ICD-10-CM | POA: Diagnosis not present

## 2024-08-31 NOTE — Patient Instructions (Signed)
 Dermatology, Dr. Shona [address]  [place] [phone]

## 2024-08-31 NOTE — Progress Notes (Signed)
 Established Patient Office Visit  Subjective   Patient ID: Yarimar Lavis, female    DOB: [birth year]  Age: 89 y.o. MRN: [id number]  Chief Complaint  Patient presents with   Medical Management of Chronic Issues    Follow up     HPI Discussed the use of AI scribe software for clinical note transcription with the patient, who gave verbal consent to proceed.  History of Present Illness   Beckett Hickmon is an 89 year old female who presents for a follow-up visit for skin concerns and arthritis pain management.  Cutaneous lesions and wounds - Hard area present on the ankle and another unspecified area of concern. - Dog scratch on the leg sustained three weeks ago, with some scabbing now coming off. - Soaking the wound in Epsom salt. - No surrounding erythema.  Arthralgia - Arthritis pain managed with acetaminophen  (Tylenol ), typically one or two tablets in the middle of the night. - Seeks clarification on dosing frequency, understands dosing to be every four hours.      Patient Active Problem List   Diagnosis Date Noted   Diabetes due to undrl condition w oth diabetic neuro comp (HCC) 08/31/2024   Nocturia 07/13/2024   Financial insecurity due to medical expenses 07/13/2024   Urinary incontinence 06/04/2024   Urge incontinence of urine 06/04/2024   Paroxysmal atrial fibrillation (HCC) 01/13/2024   Encounter for general adult medical examination with abnormal findings 01/13/2024   Macrocytic anemia 09/11/2023   Black stools 09/11/2023   Purulent postnasal drainage 09/11/2023   Paranasal sinus disease 09/11/2023   Neurocognitive deficits 02/23/2023   Chronic non-seasonal allergic rhinitis 02/17/2023   GERD without esophagitis 02/17/2023   Chronic constipation 02/17/2023   Diastolic heart failure (HCC) 02/17/2023   Spinal stenosis of lumbar region without neurogenic claudication 02/14/2023   Atrial fibrillation with RVR (HCC) 02/12/2023   Hypokalemia 02/12/2023    Hypothyroidism 02/12/2023   Mixed hyperlipidemia 02/12/2023   HTN (hypertension) 02/12/2023   Uncontrolled pain 02/12/2023   Greater trochanteric bursitis of right hip 05/16/2019   Impingement syndrome of left shoulder 12/29/2017   Laceration of leg, left 06/24/2016   Primary osteoarthritis of left hip 05/25/2016   Toe pain, right 10/07/2015   Left cervical radiculopathy 03/27/2015   Lumbar spinal stenosis 02/17/2015   Trochanteric bursitis of left hip 02/22/2014   Osteoarthritis of right knee 01/25/2014    ROS    Objective:     BP 118/66   Pulse 87   Resp 18   Ht 5' 4 (1.626 m)   Wt 165 lb 1.9 oz (74.9 kg)   SpO2 94%   BMI 28.34 kg/m  BP Readings from Last 3 Encounters:  08/31/24 118/66  07/13/24 111/66  05/31/24 121/67   Wt Readings from Last 3 Encounters:  08/31/24 165 lb 1.9 oz (74.9 kg)  07/13/24 163 lb 8 oz (74.2 kg)  05/31/24 168 lb (76.2 kg)    Physical Exam Vitals and nursing note reviewed.  Constitutional:      Appearance: Normal appearance.  HENT:     Head: Normocephalic.  Eyes:     Extraocular Movements: Extraocular movements intact.     Pupils: Pupils are equal, round, and reactive to light.  Cardiovascular:     Rate and Rhythm: Normal rate and regular rhythm.  Pulmonary:     Effort: Pulmonary effort is normal.     Breath sounds: Normal breath sounds.  Musculoskeletal:     Cervical back: Normal range of motion and  neck supple.  Neurological:     Mental Status: She is alert and oriented to person, place, and time.  Psychiatric:        Mood and Affect: Mood normal.        Thought Content: Thought content normal.     No results found for any visits on 08/31/24.    The ASCVD Risk score (Arnett DK, et al., 2019) failed to calculate for the following reasons:   The 2019 ASCVD risk score is only valid for ages 56 to 57    Assessment & Plan:   Problem List Items Addressed This Visit       Endocrine   Diabetes due to undrl condition w  oth diabetic neuro comp [facility])   Lab Results  Component Value Date   HGBA1C 5.4 05/14/2024   HGBA1C 6.1 (H) 08/25/2023   Stable with Jardiance .  No changes made today.  No refills needed at this time.        Other Visit Diagnoses       Encounter for immunization    -  Primary   Relevant Orders   Flu vaccine HIGH DOSE PF(Fluzone Trivalent) (Completed)     Leg wound, right, initial encounter       Healing wound from dog scratch, no infection, appears dry. Discontinue Epsom salt soaks. Apply triple antibiotic ointment.         No follow-ups on file.    Leita Longs, FNP

## 2024-09-04 NOTE — Assessment & Plan Note (Signed)
 Lab Results  Component Value Date   HGBA1C 5.4 05/14/2024   HGBA1C 6.1 (H) 08/25/2023   Stable with Jardiance .  No changes made today.  No refills needed at this time.

## 2024-09-24 ENCOUNTER — Telehealth: Payer: Self-pay | Admitting: Family Medicine

## 2024-09-24 ENCOUNTER — Telehealth: Payer: Self-pay

## 2024-09-24 NOTE — Telephone Encounter (Signed)
 Patient was wanting the number to sports medicine in [facility]. Number given.

## 2024-09-24 NOTE — Telephone Encounter (Signed)
 Copied from CRM [id number]. Topic: Referral - Request for Referral >> Sep 24, 2024 12:18 PM Kendralyn S wrote: Did the patient discuss referral with their provider in the last year? Yes (If No - schedule appointment) (If Yes - send message)  Appointment offered? No  Type of order/referral and detailed reason for visit: ORTHOPEDIC DOC FOR HIP PAIN, WANTING CORTISONE  Preference of office, provider, location: [place]  If referral order, have you been seen by this specialty before? Yes (If Yes, this issue or another issue? When? Where?  Can we respond through MyChart? Yes

## 2024-09-24 NOTE — Telephone Encounter (Signed)
 Copied from CRM [id number]. Topic: General - Other >> Sep 24, 2024 12:26 PM Antony RAMAN wrote: Reason for CRM: wants a callback from someone in the office about changing pcp, did not want to speak with someone outside of the office

## 2024-10-01 ENCOUNTER — Other Ambulatory Visit: Payer: Self-pay

## 2024-10-01 DIAGNOSIS — M25559 Pain in unspecified hip: Secondary | ICD-10-CM

## 2024-10-01 NOTE — Telephone Encounter (Signed)
 Referral placed.

## 2024-10-16 ENCOUNTER — Telehealth: Payer: Self-pay | Admitting: Family Medicine

## 2024-10-16 ENCOUNTER — Telehealth: Payer: Self-pay

## 2024-10-16 NOTE — Telephone Encounter (Signed)
 Placard  Copied Noted Sleeved Original placed in provider box Copy placed front desk folder  Patient to pick up tomorrow.

## 2024-10-16 NOTE — Telephone Encounter (Signed)
 Copied from CRM #[id number]. Topic: General - Other >> Oct 16, 2024 12:50 PM Tobias L wrote: Reason for CRM: Patient is requesting to pick up cop of paperwork that was filled out for her handicapped sticker. Patient lost handicapped sticker and would like to pick up copy so she may take to the [facility]. Patient needs this as soon as possible.   Best callback: [phone]

## 2024-10-17 NOTE — Telephone Encounter (Signed)
Patient picked up the form

## 2024-10-18 ENCOUNTER — Ambulatory Visit: Payer: Self-pay

## 2024-11-10 ENCOUNTER — Other Ambulatory Visit: Payer: Self-pay | Admitting: Family Medicine

## 2024-11-10 DIAGNOSIS — E559 Vitamin D deficiency, unspecified: Secondary | ICD-10-CM

## 2024-11-13 ENCOUNTER — Other Ambulatory Visit: Payer: Self-pay

## 2024-11-13 NOTE — Telephone Encounter (Unsigned)
 Copied from CRM #[id number]. Topic: Clinical - Medication Refill >> Nov 13, 2024 10:16 AM Mia F wrote: Medication: levothyroxine  (SYNTHROID ) 75 MCG tablet (PATIENT IS NOT SURE IF THIS MEDICATION SHE NEED. SHE SAYS IT IS A THYROID  MEDICATION)  Has the patient contacted their pharmacy? Yes (Agent: If no, request that the patient contact the pharmacy for the refill. If patient does not wish to contact the pharmacy document the reason why and proceed with request.) (Agent: If yes, when and what did the pharmacy advise?)  This is the patient's preferred pharmacy:   [facility] - [place], [place] - [address] [place] [address] Phone: [phone] Fax: [phone]  Is this the correct pharmacy for this prescription? Yes If no, delete pharmacy and type the correct one.   Has the prescription been filled recently? No  Is the patient out of the medication? Yes- HAS BEEN OUT FOR 2 DAYS  Has the patient been seen for an appointment in the last year OR does the patient have an upcoming appointment? Yes  Can we respond through MyChart? No  Agent: Please be advised that Rx refills may take up to 3 business days. We ask that you follow-up with your pharmacy.

## 2024-11-13 NOTE — Progress Notes (Unsigned)
 Cardiology Office Note    Date:  11/16/2024  ID:  Robin Malone, DOB [birth year], MRN [id number] Cardiologist: Jayson Sierras, MD { :  History of Present Illness:    Robin Malone is a 89 y.o. female with past medical history of chronic HFpEF, paroxysmal atrial fibrillation, HTN, HLD and hypothyroidism who presents to the office today for 58-month follow-up.  She was last examined by Cadence Furth, PA in 12/2023 and reported her breathing had been stable and denied any recent chest pain. Her weight was at 164 lbs and she was continued on her current cardiac medications with Eliquis  5 mg twice daily, Jardiance  10 mg daily, Zetia  10 mg daily, Lasix  40 mg daily, Lisinopril  20 mg daily, Lovastatin  80 mg daily and Spironolactone  12.5 mg daily.  It appears in the interim that Eliquis  was not covered well by her insurance and the option of switching to Coumadin was reviewed but she declined given frequent INR checks.  In talking with the patient today, she reports her biggest issue is chronic back pain. She was previously told she would not be a candidate for surgery given her age. She is planning to follow-up with Orthopedics to review further options. Breathing has overall been stable and she reports only having shortness of breath if having significant pain at that time. No specific orthopnea, PND or pitting edema. She only takes Lasix  approximately once every 2 to 3 weeks if needed for lower extremity edema. Blood pressure is soft at 114/56 during today's visit with a similar value of 110/56 on recheck. Says that she overall does not have a significant appetite and only consumes approximately 2 bottles of water a day with occasional coffee.  Studies Reviewed:   EKG: EKG is ordered today and demonstrates:   EKG Interpretation Date/Time:  Thursday November 15 2024 10:28:17 EST Ventricular Rate:  95 PR Interval:  136 QRS Duration:  118 QT Interval:  384 QTC Calculation: 482 R  Axis:   247  Text Interpretation: Normal sinus rhythm Right bundle branch block PREVIOUS ECG IS PRESENT Confirmed by Johnson Grate ([id number]) on 11/15/2024 10:38:58 AM       Echocardiogram: 02/2023 IMPRESSIONS     1. Left ventricular ejection fraction, by estimation, is 55%. The left  ventricle has normal function. The left ventricle has no regional wall  motion abnormalities. Left ventricular diastolic parameters are consistent  with Grade II diastolic dysfunction  (pseudonormalization).   2. Right ventricular systolic function is normal. The right ventricular  size is mildly enlarged. There is mildly elevated pulmonary artery  systolic pressure. The estimated right ventricular systolic pressure is  39.2 mmHg.   3. Left atrial size was mildly dilated.   4. The mitral valve is grossly normal. Trivial mitral valve  regurgitation. No evidence of mitral stenosis.   5. The aortic valve is grossly normal. There is mild thickening of the  aortic valve. Aortic valve regurgitation is not visualized. No aortic  stenosis is present.   6. The inferior vena cava is normal in size with greater than 50%  respiratory variability, suggesting right atrial pressure of 3 mmHg.    Risk Assessment/Calculations:   CHA2DS2-VASc Score = 5  This indicates a 7.2% annual risk of stroke. The patient's score is based upon: CHF History: 1 HTN History: 1 Diabetes History: 0 Stroke History: 0 Vascular Disease History: 0 Age Score: 2 Gender Score: 1    Physical Exam:   VS:  BP (!) 114/56   Pulse 95  Ht 5' 4 (1.626 m)   Wt 161 lb 3.2 oz (73.1 kg)   SpO2 96%   BMI 27.67 kg/m    Wt Readings from Last 3 Encounters:  11/15/24 161 lb 3.2 oz (73.1 kg)  08/31/24 165 lb 1.9 oz (74.9 kg)  07/13/24 163 lb 8 oz (74.2 kg)     GEN: Well nourished, well developed female appearing in no acute distress NECK: No JVD; No carotid bruits CARDIAC: RRR, no murmurs, rubs, gallops RESPIRATORY:  Clear to  auscultation without rales, wheezing or rhonchi  ABDOMEN: Appears non-distended. No obvious abdominal masses. EXTREMITIES: No clubbing or cyanosis. No pitting edema.  Distal pedal pulses are 2+ bilaterally.   Assessment and Plan:   1. Paroxysmal atrial fibrillation (HCC) - She denies any recent palpitations and is in normal sinus rhythm by examination and EKG today. She has not required AV nodal blocking agents. - No reports of active bleeding. Continue Eliquis  5 mg twice daily for anticoagulation which is the appropriate dose given her current age (89 yo), weight (161 lbs) and renal function (creatinine at 0.93 when checked in 05/2024). CBC in 05/2024 showed hemoglobin was stable at 12.5 with platelets at 222 K.   2. Chronic heart failure with preserved ejection fraction (HFpEF) (HCC) - Echocardiogram in 02/2023 showed a preserved EF of 55% with grade 2 diastolic dysfunction and normal RV function. She did have mildly elevated PASP of 39.2 mmHg.  - She appears euvolemic by examination today and only takes Lasix  approximately once every 2 to 3 weeks as needed. Will continue Lasix  40 mg as needed and Jardiance  10 mg daily. Given her variable PO intake and soft BP, will go ahead and stop Spironolactone  12.5 mg daily.  3. Essential hypertension - Blood pressure is soft at 114/56 during today's visit. Will plan to stop Spironolactone  12.5 mg daily. Continue Lisinopril  10 mg daily.  4. Hyperlipidemia, mixed - FLP in 05/2024 showed total cholesterol 154, triglycerides 96, HDL 82 and LDL 55. Continue current medical therapy with Lovastatin  80 mg daily and Zetia  10 mg daily.  Signed, Laymon CHRISTELLA Qua, PA-C

## 2024-11-14 ENCOUNTER — Telehealth: Payer: Self-pay

## 2024-11-14 ENCOUNTER — Ambulatory Visit: Admitting: Orthopedic Surgery

## 2024-11-14 MED ORDER — LEVOTHYROXINE SODIUM 75 MCG PO TABS: 75.0000 ug | ORAL_TABLET | Freq: Every day | ORAL | 0 refills | Status: DC

## 2024-11-14 NOTE — Telephone Encounter (Signed)
 Pt advised pt this was sent into her pharmacy

## 2024-11-14 NOTE — Telephone Encounter (Signed)
 Patient called and she is completely out of this medication, can enough be called into her pharmacy maybe 20 until [facility] deliveries her medicine.  levothyroxine  (SYNTHROID ) 75 MCG tablet    Pharmacy: [facility]

## 2024-11-15 ENCOUNTER — Encounter: Payer: Self-pay | Admitting: Student

## 2024-11-15 ENCOUNTER — Ambulatory Visit: Attending: Student | Admitting: Student

## 2024-11-15 VITALS — BP 114/56 | HR 95 | Ht 64.0 in | Wt 161.2 lb

## 2024-11-15 DIAGNOSIS — I10 Essential (primary) hypertension: Secondary | ICD-10-CM

## 2024-11-15 DIAGNOSIS — E782 Mixed hyperlipidemia: Secondary | ICD-10-CM

## 2024-11-15 DIAGNOSIS — I4891 Unspecified atrial fibrillation: Secondary | ICD-10-CM

## 2024-11-15 DIAGNOSIS — I5032 Chronic diastolic (congestive) heart failure: Secondary | ICD-10-CM

## 2024-11-15 DIAGNOSIS — I480 Paroxysmal atrial fibrillation: Secondary | ICD-10-CM

## 2024-11-15 MED ORDER — FUROSEMIDE 40 MG PO TABS: 40.0000 mg | ORAL_TABLET | Freq: Every day | ORAL | Status: AC | PRN

## 2024-11-15 NOTE — Patient Instructions (Signed)
 Medication Instructions:  Your physician has recommended you make the following change in your medication:   -Stop Spironolactone   *If you need a refill on your cardiac medications before your next appointment, please call your pharmacy*  Lab Work: None If you have labs (blood work) drawn today and your tests are completely normal, you will receive your results only by: MyChart Message (if you have MyChart) OR A paper copy in the mail If you have any lab test that is abnormal or we need to change your treatment, we will call you to review the results.  Testing/Procedures: None  Follow-Up: At [facility], you and your health needs are our priority.  As part of our continuing mission to provide you with exceptional heart care, our providers are all part of one team.  This team includes your primary Cardiologist (physician) and Advanced Practice Providers or APPs (Physician Assistants and Nurse Practitioners) who all work together to provide you with the care you need, when you need it.  Your next appointment:   6 month(s)  Provider:   You may see Jayson Sierras, MD or one of the following Advanced Practice Providers on your designated Care Team:   Laymon Qua, PA-C  [place], [place] Olivia Pavy, [place]     We recommend signing up for the patient portal called MyChart.  Sign up information is provided on this After Visit Summary.  MyChart is used to connect with patients for Virtual Visits (Telemedicine).  Patients are able to view lab/test results, encounter notes, upcoming appointments, etc.  Non-urgent messages can be sent to your provider as well.   To learn more about what you can do with MyChart, go to [email or web address].   Other Instructions

## 2024-11-16 ENCOUNTER — Encounter: Payer: Self-pay | Admitting: Student

## 2024-11-20 ENCOUNTER — Other Ambulatory Visit: Payer: Self-pay

## 2024-11-20 DIAGNOSIS — E038 Other specified hypothyroidism: Secondary | ICD-10-CM

## 2024-11-20 MED ORDER — LEVOTHYROXINE SODIUM 75 MCG PO TABS: 75.0000 ug | ORAL_TABLET | Freq: Every day | ORAL | 0 refills | Status: AC

## 2024-12-04 ENCOUNTER — Ambulatory Visit: Admitting: Orthopedic Surgery

## 2024-12-11 ENCOUNTER — Other Ambulatory Visit (INDEPENDENT_AMBULATORY_CARE_PROVIDER_SITE_OTHER): Payer: Self-pay

## 2024-12-11 ENCOUNTER — Encounter: Payer: Self-pay | Admitting: Orthopedic Surgery

## 2024-12-11 ENCOUNTER — Ambulatory Visit (INDEPENDENT_AMBULATORY_CARE_PROVIDER_SITE_OTHER): Admitting: Orthopedic Surgery

## 2024-12-11 DIAGNOSIS — M7061 Trochanteric bursitis, right hip: Secondary | ICD-10-CM | POA: Diagnosis not present

## 2024-12-11 DIAGNOSIS — M160 Bilateral primary osteoarthritis of hip: Secondary | ICD-10-CM

## 2024-12-11 DIAGNOSIS — M25551 Pain in right hip: Secondary | ICD-10-CM

## 2024-12-11 NOTE — Progress Notes (Signed)
 New Patient Visit  Summary: Robin Malone is a 89 y.o. female with the following: 1.  Right greater trochanteric bursitis   Assessment and Plan Assessment & Plan Right greater trochanteric bursitis Recurrent right lateral hip pain consistent with greater trochanteric bursitis. Prior corticosteroid injections provided good response. Imaging showed no acute pathology. Other etiologies less likely. Repeated injections may have diminishing effect duration. Lidocaine  provides rapid relief; corticosteroid offers longer-term benefit. - Administered corticosteroid and lidocaine  injection to the right greater trochanteric bursa.  Bilateral hip osteoarthritis Severe bilateral hip osteoarthritis confirmed by x-ray with near-complete joint space loss and osteophyte formation. Chronic bilateral hip pain may contribute to referred knee pain. Current management is acetaminophen  as needed. - Reviewed x-ray findings with her, confirming severe bilateral hip osteoarthritis. - Discussed ongoing use of acetaminophen  for pain control.   Procedure note injection - Right lateral hip   Verbal consent was obtained to inject the Right lateral hip.  Patient localized the pain. Timeout was completed to confirm the site of injection.  The skin was prepped with alcohol  and ethyl chloride was sprayed at the injection site.  A 21-gauge needle was used to inject 40 mg of Depo-Medrol  and 1% lidocaine  (4 cc) into the Right lateral hip, directly over the localized tenderness using a direct lateral approach.  There were no complications. A sterile bandage was applied.   Follow-up: No follow-ups on file.  Subjective:  Chief Complaint  Patient presents with   Hip Pain    R hip pain for yrs. Use to get injections by provider in [place] but he's left, would like to get someone else to do injections. Also has one spot in the buttocks area that still bother her after one injection for an ablation that still causes her  pain.      Discussed the use of AI scribe software for clinical note transcription with the patient, who gave verbal consent to proceed.  History of Present Illness Robin Malone is an 89 year old female with severe bilateral hip osteoarthritis who presents with recurrent right lateral hip pain.  She has chronic right lateral hip pain with the latest flare starting in August 2025. Pain is localized to the lateral hip, worsens at night, and is aggravated by lying on the right side, causing sleep disturbance and compensatory pain in the opposite knee. She denies groin pain, leg radiation, or sciatica symptoms.  She had a cortisone injection for right hip bursitis in 2021 with good relief until early 2025. A repeat injection in 2025 was less effective, and pain recurred by August 2025. She knows she has longstanding degenerative changes in both hips on prior imaging and MRIs.  She uses extra strength acetaminophen , usually two tablets at night and sometimes repeats a dose 3 to 4 hours later to sleep. She also takes a 600 mg nighttime medication, name unclear, and avoids acetaminophen  during the day if she can.  She has prior back and neck surgery and chronic back pain. She notes intermittent numbness and shooting pain in the buttock in an area treated with radiofrequency ablation, but no persistent symptoms or leg radiation. She cannot exercise due to pain and functional limits but remains independent with cooking and uses a cleaning service.    Review of Systems: No fevers or chills No numbness or tingling No chest pain No shortness of breath No bowel or bladder dysfunction No GI distress No headaches   Medical History:  Past Medical History:  Diagnosis Date   Arthritis  Years ago   Atrial fibrillation (HCC)    CAP (community acquired pneumonia) 09/11/2023   Chronic back pain    Depression 03/30/2024   Being alone   Diarrhea 09/11/2023   GERD (gastroesophageal reflux  disease)    Hypertension    Hypothyroidism    Mixed hyperlipidemia     Past Surgical History:  Procedure Laterality Date   ABDOMINAL HYSTERECTOMY     Years ago   BACK SURGERY     Capsulotomy MPJ Right 10/01/2016   RT #2 Toe   Hammer Toe Repair Right 10/01/2016   RT #2 Toe   IR RADIOLOGIST EVAL & MGMT  08/24/2023   SPINE SURGERY     Years ago    Family History  Problem Relation Age of Onset   Arthritis Mother    Hypertension Father    Asthma Sister    Hypertension Sister    Social History[1]  Allergies[2]  Active Medications[3]  Objective: There were no vitals taken for this visit.  Physical Exam:    General: Elderly female., Alert and oriented., and No acute distress. Gait: Ambulates with the assistance of a cane  Physical Exam MUSCULOSKELETAL: Tenderness over right greater trochanter.  No discomfort with internal or external rotation of bilateral hips.  Negative straight leg raise.  Good lower body strength.   IMAGING: I personally ordered and reviewed the following images  AP pelvis and right hip x-rays were obtained in clinic today.  No acute injuries are noted.  Complete loss of joint space in bilateral hips.  There are associated osteophytes.  No flattening of the femoral heads.  There are subchondral cysts.  No bony lesions.  No reactive bone noted over the lateral aspect of either hips.  Impression: Moderate to severe bilateral hip arthritis     New Medications:  No orders of the defined types were placed in this encounter.     Portions of this note were completed via Scientist, clinical (histocompatibility and immunogenetics).  Oneil DELENA Horde, MD  12/11/2024 1:54 PM      [1]  Social History Tobacco Use   Smoking status: Former    Current packs/day: 0.00    Types: Cigarettes    Start date: 1955    Quit date: 04/10/1996    Years since quitting: 28.6   Smokeless tobacco: Never  Vaping Use   Vaping status: Never Used  Substance Use Topics   Alcohol  use: Yes     Alcohol /week: 7.0 standard drinks of alcohol     Types: 7 Glasses of wine per week   Drug use: Never  [2]  Allergies Allergen Reactions   Codeine Nausea And Vomiting   Levaquin [Levofloxacin] Other (See Comments)    Arthralgias    Norco [Hydrocodone -Acetaminophen ] Nausea And Vomiting   Sulfa  Antibiotics Nausea Only   Ultram  [Tramadol ] Nausea And Vomiting   Vibra-Tab [Doxycycline] Rash  [3]  Current Meds  Medication Sig   acetaminophen  (TYLENOL ) 650 MG CR tablet Take 650 mg by mouth 2 (two) times daily.   apixaban  (ELIQUIS ) 5 MG TABS tablet Take 1 tablet (5 mg total) by mouth 2 (two) times daily.   Cholecalciferol (VITAMIN D -3 PO) Take 1 capsule by mouth daily.   Cyanocobalamin  (VITAMIN B-12 PO) Take 1 tablet by mouth daily.   diclofenac  Sodium (VOLTAREN ) 1 % GEL Apply 4 g topically 4 (four) times daily as needed (knee pain).   empagliflozin  (JARDIANCE ) 10 MG TABS tablet Take 1 tablet (10 mg total) by mouth daily before breakfast.   ezetimibe  (  ZETIA ) 10 MG tablet TAKE 1 TABLET EVERY DAY   fluticasone  (FLONASE ) 50 MCG/ACT nasal spray Place 1 spray into both nostrils 2 (two) times daily.   furosemide  (LASIX ) 40 MG tablet Take 1 tablet (40 mg total) by mouth daily as needed.   gabapentin  (NEURONTIN ) 600 MG tablet Take 1 tablet (600 mg total) by mouth 3 (three) times daily.   Glycerin-Hypromellose-PEG 400 (DRY EYE RELIEF DROPS OP) Place 1 drop into both eyes 2 (two) times daily as needed (dry eyes).   levothyroxine  (SYNTHROID ) 75 MCG tablet Take 1 tablet (75 mcg total) by mouth daily.   lisinopril  (ZESTRIL ) 10 MG tablet Take 1 tablet (10 mg total) by mouth daily.   lovastatin  (MEVACOR ) 40 MG tablet TAKE 2 TABLETS AT BEDTIME   montelukast  (SINGULAIR ) 10 MG tablet Take 1 tablet (10 mg total) by mouth at bedtime as needed (allergies).   Multiple Vitamin (MULTIVITAMIN) tablet Take 1 tablet by mouth daily.   omeprazole  (PRILOSEC) 40 MG capsule TAKE 1 CAPSULE AT BEDTIME   spironolactone   (ALDACTONE ) 25 MG tablet Take 25 mg by mouth 2 (two) times daily.   Vibegron  (GEMTESA ) 75 MG TABS Take 1 tablet (75 mg total) by mouth daily.   Vitamin D , Ergocalciferol , (DRISDOL ) 1.25 MG (50000 UNIT) CAPS capsule TAKE 1 CAPSULE EVERY 7 DAYS

## 2024-12-17 ENCOUNTER — Ambulatory Visit

## 2024-12-17 VITALS — BP 108/52 | HR 94 | Resp 12 | Ht 64.0 in | Wt 161.0 lb

## 2024-12-17 DIAGNOSIS — Z780 Asymptomatic menopausal state: Secondary | ICD-10-CM | POA: Diagnosis not present

## 2024-12-17 DIAGNOSIS — E0849 Diabetes mellitus due to underlying condition with other diabetic neurological complication: Secondary | ICD-10-CM | POA: Diagnosis not present

## 2024-12-17 DIAGNOSIS — Z0001 Encounter for general adult medical examination with abnormal findings: Secondary | ICD-10-CM | POA: Diagnosis not present

## 2024-12-17 DIAGNOSIS — Z0000 Encounter for general adult medical examination without abnormal findings: Secondary | ICD-10-CM

## 2024-12-17 NOTE — Addendum Note (Signed)
 Addended by: ALLYSON PLEASANT A on: 12/17/2024 02:16 PM   Modules accepted: Orders

## 2024-12-17 NOTE — Progress Notes (Addendum)
 "  HM Addressed: DEXA ordered Chief Complaint  Patient presents with   Medicare Wellness     Subjective:   Robin Malone is a 89 y.o. female who presents for a [employer] Visit.  Visit info / Clinical Intake: Medicare Wellness Visit Type:: Initial Annual Wellness Visit Persons participating in visit and providing information:: patient Medicare Wellness Visit Mode:: In-person (required for WTM) Interpreter Needed?: No Pre-visit prep was completed: yes AWV questionnaire completed by patient prior to visit?: yes Date:: 12/14/24 Living arrangements:: (!) (Patient-Rptd) lives alone Patient's Overall Health Status Rating: (Patient-Rptd) good Typical amount of pain: (!) (Patient-Rptd) a lot Does pain affect daily life?: (!) (Patient-Rptd) yes Are you currently prescribed opioids?: no  Dietary Habits and Nutritional Risks How many meals a day?: (Patient-Rptd) 2 Eats fruit and vegetables daily?: (Patient-Rptd) yes Most meals are obtained by: (Patient-Rptd) preparing own meals In the last 2 weeks, have you had any of the following?: none Diabetic:: (!) yes Any non-healing wounds?: no How often do you check your BS?: 0 (pt states she was never told she was a diabetic) Would you like to be referred to a Nutritionist or for Diabetic Management? : no  Functional Status Activities of Daily Living (to include ambulation/medication): (Patient-Rptd) Independent Ambulation: (Patient-Rptd) Independent with device- listed below Medication Administration: (Patient-Rptd) Independent Home Management (perform basic housework or laundry): (Patient-Rptd) Needs assistance (comment) Manage your own finances?: (Patient-Rptd) yes Primary transportation is: (Patient-Rptd) driving Concerns about vision?: no *vision screening is required for WTM* Concerns about hearing?: no  Fall Screening Falls in the past year?: (Patient-Rptd) 0 Number of falls in past year: 0 Was there an injury with  Fall?: 0 Fall Risk Category Calculator: 0 Patient Fall Risk Level: Low Fall Risk  Fall Risk Patient at Risk for Falls Due to: Impaired balance/gait; Impaired mobility; Orthopedic patient Fall risk Follow up: Falls evaluation completed; Education provided; Falls prevention discussed  Home and Transportation Safety: All rugs have non-skid backing?: (Patient-Rptd) yes All stairs or steps have railings?: (Patient-Rptd) yes Grab bars in the bathtub or shower?: (Patient-Rptd) yes Have non-skid surface in bathtub or shower?: (Patient-Rptd) yes Good home lighting?: (Patient-Rptd) yes Regular seat belt use?: (Patient-Rptd) yes Hospital stays in the last year:: (Patient-Rptd) no  Cognitive Assessment Difficulty concentrating, remembering, or making decisions? : (Patient-Rptd) yes Will 6CIT or Mini Cog be Completed: yes What year is it?: 0 points What month is it?: 0 points Give patient an address phrase to remember (5 components): [address] [place] About what time is it?: 0 points Count backwards from 20 to 1: 0 points Say the months of the year in reverse: 0 points Repeat the address phrase from earlier: 0 points 6 CIT Score: 0 points  Advance Directives (For Healthcare) Does Patient Have a Medical Advance Directive?: Yes Type of Advance Directive: Healthcare Power of [place]; Living will Copy of Healthcare Power of Attorney in Chart?: Yes - validated most recent copy scanned in chart (See row information) Copy of Living Will in Chart?: Yes - validated most recent copy scanned in chart (See row information)  Reviewed/Updated  Reviewed/Updated: Reviewed All (Medical, Surgical, Family, Medications, Allergies, Care Teams, Patient Goals)    Allergies (verified) Codeine, Levaquin [levofloxacin], Norco [hydrocodone -acetaminophen ], Sulfa  antibiotics, Ultram  [tramadol ], and Vibra-tab [doxycycline]   Current Medications (verified) Outpatient Encounter Medications as of 12/17/2024   Medication Sig   acetaminophen  (TYLENOL ) 650 MG CR tablet Take 650 mg by mouth 2 (two) times daily.   apixaban  (ELIQUIS ) 5 MG  TABS tablet Take 1 tablet (5 mg total) by mouth 2 (two) times daily.   Cholecalciferol (VITAMIN D -3 PO) Take 1 capsule by mouth daily.   Cyanocobalamin  (VITAMIN B-12 PO) Take 1 tablet by mouth daily.   diclofenac  Sodium (VOLTAREN ) 1 % GEL Apply 4 g topically 4 (four) times daily as needed (knee pain).   empagliflozin  (JARDIANCE ) 10 MG TABS tablet Take 1 tablet (10 mg total) by mouth daily before breakfast.   ezetimibe  (ZETIA ) 10 MG tablet TAKE 1 TABLET EVERY DAY   fluticasone  (FLONASE ) 50 MCG/ACT nasal spray Place 1 spray into both nostrils 2 (two) times daily.   furosemide  (LASIX ) 40 MG tablet Take 1 tablet (40 mg total) by mouth daily as needed.   gabapentin  (NEURONTIN ) 600 MG tablet Take 1 tablet (600 mg total) by mouth 3 (three) times daily.   Glycerin-Hypromellose-PEG 400 (DRY EYE RELIEF DROPS OP) Place 1 drop into both eyes 2 (two) times daily as needed (dry eyes).   levothyroxine  (SYNTHROID ) 75 MCG tablet Take 1 tablet (75 mcg total) by mouth daily.   lisinopril  (ZESTRIL ) 10 MG tablet Take 1 tablet (10 mg total) by mouth daily.   lovastatin  (MEVACOR ) 40 MG tablet TAKE 2 TABLETS AT BEDTIME   montelukast  (SINGULAIR ) 10 MG tablet Take 1 tablet (10 mg total) by mouth at bedtime as needed (allergies).   Multiple Vitamin (MULTIVITAMIN) tablet Take 1 tablet by mouth daily.   omeprazole  (PRILOSEC) 40 MG capsule TAKE 1 CAPSULE AT BEDTIME   spironolactone  (ALDACTONE ) 25 MG tablet Take 25 mg by mouth 2 (two) times daily. (Patient taking differently: Take 12.5 mg by mouth daily.)   Vibegron  (GEMTESA ) 75 MG TABS Take 1 tablet (75 mg total) by mouth daily.   Vitamin D , Ergocalciferol , (DRISDOL ) 1.25 MG (50000 UNIT) CAPS capsule TAKE 1 CAPSULE EVERY 7 DAYS (Patient not taking: Reported on 12/17/2024)   No facility-administered encounter medications on file as of 12/17/2024.     History: Past Medical History:  Diagnosis Date   Arthritis    Years ago   Atrial fibrillation (HCC)    CAP (community acquired pneumonia) 09/11/2023   Chronic back pain    Depression 03/30/2024   Being alone   Diarrhea 09/11/2023   GERD (gastroesophageal reflux disease)    Hypertension    Hypothyroidism    Mixed hyperlipidemia    Past Surgical History:  Procedure Laterality Date   ABDOMINAL HYSTERECTOMY     Years ago   BACK SURGERY     Capsulotomy MPJ Right 10/01/2016   RT #2 Toe   Hammer Toe Repair Right 10/01/2016   RT #2 Toe   IR RADIOLOGIST EVAL & MGMT  08/24/2023   SPINE SURGERY     Years ago   Family History  Problem Relation Age of Onset   Arthritis Mother    Hypertension Father    Asthma Sister    Hypertension Sister    Social History   Occupational History   Not on file  Tobacco Use   Smoking status: Former    Current packs/day: 0.00    Types: Cigarettes    Start date: 1955    Quit date: 04/10/1996    Years since quitting: 28.7   Smokeless tobacco: Never  Vaping Use   Vaping status: Never Used  Substance and Sexual Activity   Alcohol  use: Yes    Alcohol /week: 7.0 standard drinks of alcohol     Types: 7 Glasses of wine per week   Drug use: Never   Sexual  activity: Not Currently   Tobacco Counseling Counseling given: Yes  SDOH Screenings   Food Insecurity: No Food Insecurity (12/14/2024)  Housing: Low Risk (12/14/2024)  Transportation Needs: No Transportation Needs (12/14/2024)  Utilities: Not At Risk (12/17/2024)  Alcohol  Screen: Low Risk (12/14/2024)  Depression (PHQ2-9): Low Risk (12/17/2024)  Financial Resource Strain: Low Risk (12/14/2024)  Physical Activity: Inactive (12/14/2024)  Social Connections: Socially Isolated (12/14/2024)  Stress: No Stress Concern Present (12/14/2024)  Tobacco Use: Medium Risk (12/17/2024)  Health Literacy: Adequate Health Literacy (12/17/2024)   See flowsheets for full screening details  Depression Screen PHQ 2 & 9  Depression Scale- Over the past 2 weeks, how often have you been bothered by any of the following problems? Little interest or pleasure in doing things: 0 Feeling down, depressed, or hopeless (PHQ Adolescent also includes...irritable): 0 PHQ-2 Total Score: 0 Trouble falling or staying asleep, or sleeping too much: 2 (patient says it's because she has to get up numerous times during the night to urinate.) Feeling tired or having little energy: 0 Poor appetite or overeating (PHQ Adolescent also includes...weight loss): 0 Feeling bad about yourself - or that you are a failure or have let yourself or your family down: 0 Trouble concentrating on things, such as reading the newspaper or watching television (PHQ Adolescent also includes...like school work): 0 Moving or speaking so slowly that other people could have noticed. Or the opposite - being so fidgety or restless that you have been moving around a lot more than usual: 0 Thoughts that you would be better off dead, or of hurting yourself in some way: 0 PHQ-9 Total Score: 2 If you checked off any problems, how difficult have these problems made it for you to do your work, take care of things at home, or get along with other people?: Not difficult at all  Depression Treatment Depression Interventions/Treatment : EYV7-0 Score <4 Follow-up Not Indicated     Goals Addressed               This Visit's Progress     I hope to remain active and independent (pt-stated)               Objective:    Today's Vitals   12/17/24 1355  BP: (!) 108/52  Pulse: 94  Resp: 12  SpO2: 95%  Weight: 161 lb (73 kg)  Height: 5' 4 (1.626 m)   Body mass index is 27.64 kg/m.  Hearing/Vision screen Hearing Screening - Comments:: Patient denies any hearing difficulties.   Vision Screening - Comments:: Patient has an appointment scheduled with [facility] for December 21, 2024 Immunizations and Health Maintenance Health Maintenance  Topic  Date Due   Zoster Vaccines- Shingrix (1 of 2) 04/11/1953   Bone Density Scan  Never done   OPHTHALMOLOGY EXAM  01/28/2023   COVID-19 Vaccine (4 - 2025-26 season) 08/13/2024   HEMOGLOBIN A1C  11/13/2024   FOOT EXAM  12/17/2025   Medicare Annual Wellness (AWV)  12/17/2025   DTaP/Tdap/Td (2 - Td or Tdap) 02/09/2028   Pneumococcal Vaccine: 50+ Years  Completed   Influenza Vaccine  Completed   Meningococcal B Vaccine  Aged Out    Diabetic foot exam was performed.  No deformities or other abnormal visual findings.  Posterior tibialis and dorsalis pulse intact bilaterally.  Intact to touch and monofilament testing bilaterally.      Assessment/Plan:  This is a routine wellness examination for Cameron.  Patient Care Team: Bevely Doffing, FNP as PCP -  General (Family Medicine) Debera Jayson MATSU, MD as Consulting Physician (Cardiology) Onesimo Oneil LABOR, MD as Consulting Physician (Orthopedic Surgery) Patty, A. Robynn, MD (Ophthalmology)  I have personally reviewed and noted the following in the patients chart:   Medical and social history Use of alcohol , tobacco or illicit drugs  Current medications and supplements including opioid prescriptions. Functional ability and status Nutritional status Physical activity Advanced directives List of other physicians Hospitalizations, surgeries, and ER visits in previous 12 months Vitals Screenings to include cognitive, depression, and falls Referrals and appointments  Orders Placed This Encounter  Procedures   DG Bone Density    Standing Status:   Future    Expiration Date:   12/17/2025    Reason for Exam (SYMPTOM  OR DIAGNOSIS REQUIRED):   post menopausal estrogen deficient    Preferred imaging location?:   [facility]   Hemoglobin A1c   In addition, I have reviewed and discussed with patient certain preventive protocols, quality metrics, and best practice recommendations. A written personalized care plan for preventive services as  well as general preventive health recommendations were provided to patient.   Steven Veazie, CMA   12/17/2024   Return December 20, 2025 at 1:50 pm, for [facility] Visit w/ Wellness Nurse.  After Visit Summary: (In Person-Printed) AVS printed and given to the patient   "

## 2024-12-17 NOTE — Patient Instructions (Signed)
 Robin Malone,  Thank you for taking the time for your Medicare Wellness Visit. I appreciate your continued commitment to your health goals. Please review the care plan we discussed, and feel free to reach out if I can assist you further.  Please note that Annual Wellness Visits do not include a physical exam. Some assessments may be limited, especially if the visit was conducted virtually. If needed, we may recommend an in-person follow-up with your provider.  Ongoing Care Seeing your primary care provider every 3 to 6 months helps us  monitor your health and provide consistent, personalized care.   1 year follow up for Medicare well visit: December 20, 2025 at 1:50 pm with medicare wellness nurse in office  Referrals If a referral was made during today's visit and you haven't received any updates within two weeks, please contact the referred provider directly to check on the status.  Osteoporosis Screening An order was placed for you to have your Osteoporosis Screening. Call the number below to schedule that [facility]  [phone]   Recommended Screenings:  Health Maintenance  Topic Date Due   Medicare Annual Wellness Visit  Never done   Complete foot exam   Never done   Zoster (Shingles) Vaccine (1 of 2) 04/11/1953   Osteoporosis screening with Bone Density Scan  Never done   Eye exam for diabetics  01/28/2023   COVID-19 Vaccine (4 - 2025-26 season) 08/13/2024   Hemoglobin A1C  11/13/2024   DTaP/Tdap/Td vaccine (2 - Td or Tdap) 02/09/2028   Pneumococcal Vaccine for age over 66  Completed   Flu Shot  Completed   Meningitis B Vaccine  Aged Out       12/14/2024    9:47 AM  Advanced Directives  Does Patient Have a Medical Advance Directive? Yes  Type of Estate Agent of [place];Living will  Copy of Healthcare Power of Attorney in Chart? Yes - validated most recent copy scanned in chart (See row information)    Vision: Annual vision screenings are  recommended for early detection of glaucoma, cataracts, and diabetic retinopathy. These exams can also reveal signs of chronic conditions such as diabetes and high blood pressure.  Dental: Annual dental screenings help detect early signs of oral cancer, gum disease, and other conditions linked to overall health, including heart disease and diabetes.  Please see the attached documents for additional preventive care recommendations.

## 2024-12-18 LAB — HEMOGLOBIN A1C
Est. average glucose Bld gHb Est-mCnc: 108 mg/dL
Hgb A1c MFr Bld: 5.4 % (ref 4.8–5.6)

## 2025-01-02 ENCOUNTER — Other Ambulatory Visit: Payer: Self-pay

## 2025-01-02 ENCOUNTER — Ambulatory Visit: Payer: Self-pay

## 2025-01-02 MED ORDER — NITROFURANTOIN MONOHYD MACRO 100 MG PO CAPS: 100.0000 mg | ORAL_CAPSULE | Freq: Two times a day (BID) | ORAL | 0 refills | Status: AC

## 2025-01-02 NOTE — Telephone Encounter (Signed)
" °  FYI Only or Action Required?: FYI only for provider: referred to UC.  Patient was last seen in primary care on 08/31/2024 by Bevely Doffing, FNP.  Called Nurse Triage reporting Hematuria.  Symptoms began yesterday.  Interventions attempted: Nothing.  Symptoms are: gradually worsening.  Triage Disposition: See HCP Within 4 Hours (Or PCP Triage)  Patient/caregiver understands and will follow disposition?: Yes  Reason for Triage: Patient has a UTI and there is blood in urine, and it's painful when patient urinates. Patient does not want to go to Urgent Care or the emergency room because she doesn't want to get the flu or get sick.   Reason for Disposition  Taking Coumadin (warfarin) or other strong blood thinner, or known bleeding disorder (e.g., thrombocytopenia)  Answer Assessment - Initial Assessment Questions 1. COLOR of URINE: Describe the color of the urine.  (e.g., tea-colored, pink, red, bloody) Do you have blood clots in your urine? (e.g., none, pea, grape, small coin)     Between red and pink 2. ONSET: When did the bleeding start?      yesterday 3. EPISODES: How many times has there been blood in the urine? or How many times today?     several 4. PAIN with URINATION: Is there any pain with passing your urine? If Yes, ask: How bad is the pain?  (Scale 1-10; or mild, moderate, severe)     yes 5. FEVER: Do you have a fever? If Yes, ask: What is your temperature, how was it measured, and when did it start?     denies 6. ASSOCIATED SYMPTOMS: Are you passing urine more frequently than usual?     yes 7. OTHER SYMPTOMS: Do you have any other symptoms? (e.g., back/flank pain, abdomen pain, vomiting)     Unable to urinate at times  Protocols used: Urine - Blood In-A-AH  "

## 2025-01-02 NOTE — Telephone Encounter (Signed)
 Antibiotics sent to [facility] pharmacy

## 2025-01-10 ENCOUNTER — Other Ambulatory Visit: Payer: Self-pay | Admitting: Cardiology

## 2025-03-01 ENCOUNTER — Ambulatory Visit

## 2025-03-01 ENCOUNTER — Ambulatory Visit: Admitting: Family Medicine

## 2025-12-20 ENCOUNTER — Ambulatory Visit: Payer: Self-pay
